# Patient Record
Sex: Female | Born: 1970 | Race: White | Hispanic: No | Marital: Single | Smoking: Light tobacco smoker
Health system: Southern US, Community
[De-identification: ages and names within clinical notes are randomized; demographics above are authoritative.]

## PROBLEM LIST (undated history)

## (undated) DIAGNOSIS — R519 Headache, unspecified: Secondary | ICD-10-CM

## (undated) DIAGNOSIS — Z8489 Family history of other specified conditions: Secondary | ICD-10-CM

## (undated) DIAGNOSIS — J449 Chronic obstructive pulmonary disease, unspecified: Secondary | ICD-10-CM

## (undated) DIAGNOSIS — E119 Type 2 diabetes mellitus without complications: Secondary | ICD-10-CM

## (undated) DIAGNOSIS — M549 Dorsalgia, unspecified: Secondary | ICD-10-CM

## (undated) DIAGNOSIS — R569 Unspecified convulsions: Secondary | ICD-10-CM

## (undated) DIAGNOSIS — R51 Headache: Secondary | ICD-10-CM

## (undated) DIAGNOSIS — C801 Malignant (primary) neoplasm, unspecified: Secondary | ICD-10-CM

## (undated) HISTORY — PX: ABDOMINAL HYSTERECTOMY: SHX81

## (undated) HISTORY — PX: APPENDECTOMY: SHX54

## (undated) HISTORY — DX: Chronic obstructive pulmonary disease, unspecified: J44.9

## (undated) HISTORY — PX: BACK SURGERY: SHX140

---

## 2003-06-29 ENCOUNTER — Emergency Department (HOSPITAL_COMMUNITY): Admission: EM | Admit: 2003-06-29 | Discharge: 2003-06-29 | Payer: Self-pay | Admitting: Emergency Medicine

## 2004-06-20 ENCOUNTER — Emergency Department: Payer: Self-pay | Admitting: Emergency Medicine

## 2004-12-26 ENCOUNTER — Ambulatory Visit: Payer: Self-pay | Admitting: Family Medicine

## 2005-02-05 ENCOUNTER — Ambulatory Visit: Payer: Self-pay | Admitting: Unknown Physician Specialty

## 2005-02-08 ENCOUNTER — Inpatient Hospital Stay: Payer: Self-pay

## 2006-03-05 ENCOUNTER — Ambulatory Visit: Payer: Self-pay | Admitting: Family Medicine

## 2006-03-07 ENCOUNTER — Ambulatory Visit: Payer: Self-pay | Admitting: Family Medicine

## 2007-07-17 ENCOUNTER — Emergency Department: Payer: Self-pay | Admitting: Emergency Medicine

## 2007-08-05 ENCOUNTER — Other Ambulatory Visit: Payer: Self-pay

## 2007-08-05 ENCOUNTER — Emergency Department: Payer: Self-pay | Admitting: Emergency Medicine

## 2009-06-23 ENCOUNTER — Emergency Department: Payer: Self-pay | Admitting: Unknown Physician Specialty

## 2009-11-01 ENCOUNTER — Emergency Department: Payer: Self-pay | Admitting: Emergency Medicine

## 2010-04-19 ENCOUNTER — Emergency Department: Payer: Self-pay | Admitting: Emergency Medicine

## 2010-05-18 ENCOUNTER — Emergency Department: Payer: Self-pay | Admitting: Emergency Medicine

## 2010-06-26 ENCOUNTER — Emergency Department: Payer: Self-pay | Admitting: Emergency Medicine

## 2010-10-08 ENCOUNTER — Ambulatory Visit: Payer: Self-pay | Admitting: Family Medicine

## 2010-10-16 ENCOUNTER — Ambulatory Visit: Payer: Self-pay | Admitting: Family Medicine

## 2010-10-29 ENCOUNTER — Ambulatory Visit: Payer: Self-pay | Admitting: Gastroenterology

## 2010-11-05 ENCOUNTER — Ambulatory Visit: Payer: Self-pay | Admitting: Gastroenterology

## 2011-02-22 ENCOUNTER — Ambulatory Visit: Payer: Self-pay | Admitting: Surgery

## 2011-03-04 ENCOUNTER — Ambulatory Visit: Payer: Self-pay | Admitting: Surgery

## 2012-02-07 ENCOUNTER — Emergency Department: Payer: Self-pay | Admitting: *Deleted

## 2012-02-07 LAB — URINALYSIS, COMPLETE
Bilirubin,UR: NEGATIVE
Glucose,UR: NEGATIVE mg/dL (ref 0–75)
Leukocyte Esterase: NEGATIVE
Ph: 7 (ref 4.5–8.0)
Squamous Epithelial: 1

## 2012-02-07 LAB — COMPREHENSIVE METABOLIC PANEL
Alkaline Phosphatase: 95 U/L (ref 50–136)
BUN: 8 mg/dL (ref 7–18)
Bilirubin,Total: 0.3 mg/dL (ref 0.2–1.0)
Chloride: 107 mmol/L (ref 98–107)
Creatinine: 0.81 mg/dL (ref 0.60–1.30)
EGFR (African American): >60
EGFR (Non-African Amer.): >60
Glucose: 97 mg/dL (ref 65–99)
Osmolality: 276 (ref 275–301)
Potassium: 3.7 mmol/L (ref 3.5–5.1)
SGOT(AST): 18 U/L (ref 15–37)
Sodium: 139 mmol/L (ref 136–145)

## 2012-02-07 LAB — CBC
HGB: 13.6 g/dL (ref 12.0–16.0)
MCH: 31.9 pg (ref 26.0–34.0)
MCHC: 33.5 g/dL (ref 32.0–36.0)
MCV: 95 fL (ref 80–100)
Platelet: 245 10*3/uL (ref 150–440)

## 2012-02-07 LAB — TROPONIN I: Troponin-I: <0.02 ng/mL

## 2012-02-07 LAB — PREGNANCY, URINE: Pregnancy Test, Urine: NEGATIVE m[IU]/mL

## 2012-02-07 LAB — LIPASE, BLOOD: Lipase: 229 U/L (ref 73–393)

## 2013-03-16 ENCOUNTER — Ambulatory Visit: Payer: Self-pay | Admitting: Family Medicine

## 2013-03-19 ENCOUNTER — Ambulatory Visit: Payer: Self-pay | Admitting: Obstetrics and Gynecology

## 2013-09-12 ENCOUNTER — Emergency Department: Payer: Self-pay | Admitting: Emergency Medicine

## 2013-12-12 ENCOUNTER — Emergency Department: Payer: Self-pay | Admitting: Emergency Medicine

## 2013-12-12 LAB — CBC
HCT: 41.5 % (ref 35.0–47.0)
HGB: 13.9 g/dL (ref 12.0–16.0)
MCH: 31.8 pg (ref 26.0–34.0)
MCHC: 33.5 g/dL (ref 32.0–36.0)
MCV: 95 fL (ref 80–100)
PLATELETS: 284 10*3/uL (ref 150–440)
RBC: 4.37 10*6/uL (ref 3.80–5.20)
RDW: 12.9 % (ref 11.5–14.5)
WBC: 14.5 10*3/uL — ABNORMAL HIGH (ref 3.6–11.0)

## 2013-12-12 LAB — COMPREHENSIVE METABOLIC PANEL
AST: 15 U/L (ref 15–37)
Albumin: 3.8 g/dL (ref 3.4–5.0)
Alkaline Phosphatase: 92 U/L
Anion Gap: 8 (ref 7–16)
BILIRUBIN TOTAL: 0.4 mg/dL (ref 0.2–1.0)
BUN: 10 mg/dL (ref 7–18)
CALCIUM: 9.3 mg/dL (ref 8.5–10.1)
CHLORIDE: 103 mmol/L (ref 98–107)
CREATININE: 0.85 mg/dL (ref 0.60–1.30)
Co2: 24 mmol/L (ref 21–32)
EGFR (Non-African Amer.): >60
Glucose: 100 mg/dL — ABNORMAL HIGH (ref 65–99)
OSMOLALITY: 269 (ref 275–301)
Potassium: 4 mmol/L (ref 3.5–5.1)
SGPT (ALT): 16 U/L (ref 12–78)
Sodium: 135 mmol/L — ABNORMAL LOW (ref 136–145)
Total Protein: 7.7 g/dL (ref 6.4–8.2)

## 2013-12-12 LAB — URINALYSIS, COMPLETE
Bacteria: NONE SEEN
Bilirubin,UR: NEGATIVE
GLUCOSE, UR: NEGATIVE mg/dL (ref 0–75)
Ketone: NEGATIVE
NITRITE: NEGATIVE
PROTEIN: NEGATIVE
Ph: 5 (ref 4.5–8.0)
Specific Gravity: 1.017 (ref 1.003–1.030)
Squamous Epithelial: 14
WBC UR: 3 /HPF (ref 0–5)

## 2014-01-25 ENCOUNTER — Emergency Department: Payer: Self-pay | Admitting: Emergency Medicine

## 2014-05-23 ENCOUNTER — Emergency Department: Payer: Self-pay | Admitting: Emergency Medicine

## 2014-06-24 HISTORY — PX: BACK SURGERY: SHX140

## 2014-07-18 ENCOUNTER — Emergency Department: Payer: Self-pay | Admitting: Emergency Medicine

## 2015-01-24 ENCOUNTER — Other Ambulatory Visit: Payer: Self-pay | Admitting: Orthopedic Surgery

## 2015-01-24 DIAGNOSIS — M545 Low back pain: Secondary | ICD-10-CM

## 2015-01-25 ENCOUNTER — Ambulatory Visit
Admission: RE | Admit: 2015-01-25 | Discharge: 2015-01-25 | Disposition: A | Discharge status: Home / Self Care | Payer: Worker's Compensation | Source: Ambulatory Visit | Attending: Orthopedic Surgery | Admitting: Orthopedic Surgery

## 2015-01-25 DIAGNOSIS — M545 Low back pain: Secondary | ICD-10-CM

## 2015-05-15 ENCOUNTER — Other Ambulatory Visit: Payer: Self-pay | Admitting: Orthopedic Surgery

## 2015-05-15 DIAGNOSIS — M5137 Other intervertebral disc degeneration, lumbosacral region: Secondary | ICD-10-CM

## 2015-05-29 ENCOUNTER — Other Ambulatory Visit: Payer: Self-pay | Admitting: Orthopedic Surgery

## 2015-05-29 ENCOUNTER — Ambulatory Visit
Admission: RE | Admit: 2015-05-29 | Discharge: 2015-05-29 | Disposition: A | Discharge status: Home / Self Care | Payer: Worker's Compensation | Source: Ambulatory Visit | Attending: Orthopedic Surgery | Admitting: Orthopedic Surgery

## 2015-05-29 DIAGNOSIS — M5137 Other intervertebral disc degeneration, lumbosacral region: Secondary | ICD-10-CM

## 2015-05-29 MED ORDER — CEFAZOLIN SODIUM-DEXTROSE 2-3 GM-% IV SOLR: 2.0000 g | Freq: Once | INTRAVENOUS | Status: DC

## 2015-05-29 MED ORDER — MIDAZOLAM HCL 2 MG/2ML IJ SOLN
1.0000 mg | INTRAMUSCULAR | Status: DC | PRN
  Administered 2015-05-29: 1 mg via INTRAVENOUS
  Administered 2015-05-29: 0.5 mg via INTRAVENOUS
  Administered 2015-05-29: 1 mg via INTRAVENOUS

## 2015-05-29 MED ORDER — IOHEXOL 180 MG/ML  SOLN
3.5000 mL | Freq: Once | INTRAMUSCULAR | Status: AC | PRN
  Administered 2015-05-29: 3.5 mL

## 2015-05-29 MED ORDER — MIDAZOLAM HCL 2 MG/2ML IJ SOLN: 1.0000 mg | INTRAMUSCULAR | Status: DC | PRN

## 2015-05-29 MED ORDER — KETOROLAC TROMETHAMINE 30 MG/ML IJ SOLN
30.0000 mg | Freq: Once | INTRAMUSCULAR | Status: AC
  Administered 2015-05-29: 30 mg via INTRAVENOUS

## 2015-05-29 MED ORDER — SODIUM CHLORIDE 0.9 % IV SOLN
Freq: Once | INTRAVENOUS | Status: AC
  Administered 2015-05-29: 08:00:00 via INTRAVENOUS

## 2015-05-29 MED ORDER — CEFAZOLIN SODIUM-DEXTROSE 2-3 GM-% IV SOLR
2.0000 g | Freq: Once | INTRAVENOUS | Status: AC
  Administered 2015-05-29: 2 g via INTRAVENOUS

## 2015-05-29 MED ORDER — FENTANYL CITRATE (PF) 100 MCG/2ML IJ SOLN: 25.0000 ug | INTRAMUSCULAR | Status: DC | PRN

## 2015-05-29 MED ORDER — KETOROLAC TROMETHAMINE 30 MG/ML IJ SOLN: 30.0000 mg | Freq: Once | INTRAMUSCULAR | Status: DC

## 2015-05-29 MED ORDER — SODIUM CHLORIDE 0.9 % IV SOLN
Freq: Once | INTRAVENOUS | Status: AC
  Administered 2015-05-29: 09:00:00 via INTRAVENOUS

## 2015-05-29 MED ORDER — FENTANYL CITRATE (PF) 100 MCG/2ML IJ SOLN
25.0000 ug | INTRAMUSCULAR | Status: DC | PRN
  Administered 2015-05-29 (×2): 50 ug via INTRAVENOUS

## 2015-05-29 NOTE — Discharge Instructions (Signed)
Discogram Post Procedure Discharge Instructions ° °1. May resume a regular diet and any medications that you routinely take (including pain medications). °2. No driving day of procedure. °3. Upon discharge go home and rest for at least 4 hours.  May use an ice pack as needed to injection sites on back.  Ice to back 30 minutes on and 30 minutes off, all day. °4. May remove bandades later, today. °5. It is not unusual to be sore for several days after this procedure. ° ° ° °Please contact our office at [phone] for the following symptoms: ° °· Fever greater than 100 degrees °· Increased swelling, pain, or redness at injection site. ° ° °Thank you for visiting [facility]. ° ° °

## 2015-05-29 NOTE — Progress Notes (Signed)
Sedation time for discogram was 45 minutes.

## 2015-08-09 ENCOUNTER — Other Ambulatory Visit: Payer: Self-pay | Admitting: Orthopedic Surgery

## 2015-08-17 ENCOUNTER — Encounter (HOSPITAL_COMMUNITY)
Admission: RE | Admit: 2015-08-17 | Discharge: 2015-08-17 | Disposition: A | Discharge status: Home / Self Care | Payer: Worker's Compensation | Source: Ambulatory Visit | Attending: Orthopedic Surgery | Admitting: Orthopedic Surgery

## 2015-08-17 ENCOUNTER — Encounter (HOSPITAL_COMMUNITY): Payer: Self-pay

## 2015-08-17 DIAGNOSIS — R9431 Abnormal electrocardiogram [ECG] [EKG]: Secondary | ICD-10-CM | POA: Diagnosis not present

## 2015-08-17 DIAGNOSIS — Z01818 Encounter for other preprocedural examination: Secondary | ICD-10-CM | POA: Diagnosis present

## 2015-08-17 DIAGNOSIS — I4510 Unspecified right bundle-branch block: Secondary | ICD-10-CM | POA: Diagnosis not present

## 2015-08-17 DIAGNOSIS — Z01812 Encounter for preprocedural laboratory examination: Secondary | ICD-10-CM | POA: Diagnosis not present

## 2015-08-17 DIAGNOSIS — M5136 Other intervertebral disc degeneration, lumbar region: Secondary | ICD-10-CM | POA: Insufficient documentation

## 2015-08-17 DIAGNOSIS — Z0183 Encounter for blood typing: Secondary | ICD-10-CM | POA: Diagnosis not present

## 2015-08-17 HISTORY — DX: Headache, unspecified: R51.9

## 2015-08-17 HISTORY — DX: Family history of other specified conditions: Z84.89

## 2015-08-17 HISTORY — DX: Headache: R51

## 2015-08-17 HISTORY — DX: Malignant (primary) neoplasm, unspecified: C80.1

## 2015-08-17 HISTORY — DX: Type 2 diabetes mellitus without complications: E11.9

## 2015-08-17 HISTORY — DX: Unspecified convulsions: R56.9

## 2015-08-17 LAB — COMPREHENSIVE METABOLIC PANEL
ALBUMIN: 3.7 g/dL (ref 3.5–5.0)
ALK PHOS: 73 U/L (ref 38–126)
ALT: 11 U/L — AB (ref 14–54)
AST: 15 U/L (ref 15–41)
Anion gap: 10 (ref 5–15)
BILIRUBIN TOTAL: 0.3 mg/dL (ref 0.3–1.2)
BUN: 5 mg/dL — AB (ref 6–20)
CALCIUM: 9.3 mg/dL (ref 8.9–10.3)
CO2: 22 mmol/L (ref 22–32)
CREATININE: 0.76 mg/dL (ref 0.44–1.00)
Chloride: 107 mmol/L (ref 101–111)
GFR calc Af Amer: >60 mL/min (ref >60)
GLUCOSE: 111 mg/dL — AB (ref 65–99)
Potassium: 4.1 mmol/L (ref 3.5–5.1)
SODIUM: 139 mmol/L (ref 135–145)
TOTAL PROTEIN: 6.7 g/dL (ref 6.5–8.1)

## 2015-08-17 LAB — CBC WITH DIFFERENTIAL/PLATELET
BASOS ABS: 0.1 10*3/uL (ref 0.0–0.1)
BASOS PCT: 1 %
EOS ABS: 0.2 10*3/uL (ref 0.0–0.7)
Eosinophils Relative: 2 %
HEMATOCRIT: 40.1 % (ref 36.0–46.0)
HEMOGLOBIN: 13.6 g/dL (ref 12.0–15.0)
Lymphocytes Relative: 37 %
Lymphs Abs: 4.1 10*3/uL — ABNORMAL HIGH (ref 0.7–4.0)
MCH: 32.4 pg (ref 26.0–34.0)
MCHC: 33.9 g/dL (ref 30.0–36.0)
MCV: 95.5 fL (ref 78.0–100.0)
MONO ABS: 0.5 10*3/uL (ref 0.1–1.0)
Monocytes Relative: 5 %
NEUTROS ABS: 6.1 10*3/uL (ref 1.7–7.7)
NEUTROS PCT: 55 %
Platelets: 268 10*3/uL (ref 150–400)
RBC: 4.2 MIL/uL (ref 3.87–5.11)
RDW: 12.9 % (ref 11.5–15.5)
WBC: 11 10*3/uL — ABNORMAL HIGH (ref 4.0–10.5)

## 2015-08-17 LAB — URINE MICROSCOPIC-ADD ON

## 2015-08-17 LAB — URINALYSIS, ROUTINE W REFLEX MICROSCOPIC
BILIRUBIN URINE: NEGATIVE
Glucose, UA: NEGATIVE mg/dL
Ketones, ur: NEGATIVE mg/dL
LEUKOCYTES UA: NEGATIVE
NITRITE: NEGATIVE
PH: 6 (ref 5.0–8.0)
Protein, ur: NEGATIVE mg/dL
SPECIFIC GRAVITY, URINE: 1.022 (ref 1.005–1.030)

## 2015-08-17 LAB — TYPE AND SCREEN
ABO/RH(D): A POS
ANTIBODY SCREEN: NEGATIVE

## 2015-08-17 LAB — SURGICAL PCR SCREEN
MRSA, PCR: NEGATIVE
STAPHYLOCOCCUS AUREUS: NEGATIVE

## 2015-08-17 LAB — APTT: APTT: 31 s (ref 24–37)

## 2015-08-17 LAB — PROTIME-INR
INR: 1.05 (ref 0.00–1.49)
PROTHROMBIN TIME: 13.9 s (ref 11.6–15.2)

## 2015-08-17 LAB — ABO/RH: ABO/RH(D): A POS

## 2015-08-17 LAB — GLUCOSE, CAPILLARY: GLUCOSE-CAPILLARY: 94 mg/dL (ref 65–99)

## 2015-08-17 NOTE — Pre-Procedure Instructions (Signed)
[name]  08/17/2015      [facility] [address] [place] - [address] [place])  [address] Phone: [phone] Fax: [phone]    Your procedure is scheduled on   Thursday  08/24/15  Report to [facility] Admitting at 530 A.M.  Call this number if you have problems the morning of surgery:  [phone]   Remember:  Do not eat food or drink liquids after midnight.  Take these medicines the morning of surgery with A SIP OF WATER   TYLENOL OR HYDROCODONE IF NEEDED   (STOP DICLOFENAC/ VOLTAREN GEL, NO ASPIRIN, IBUPROFEN/ ADVIL/ MOTRIN, GOODY POWDERS/ BC'S, VITAMINS, HERBAL MEDICINES)   Do not wear jewelry, make-up or nail polish.  Do not wear lotions, powders, or perfumes.  You may wear deodorant.  Do not shave 48 hours prior to surgery.  Men may shave face and neck.  Do not bring valuables to the hospital.  [facility] is not responsible for any belongings or valuables.  Contacts, dentures or bridgework may not be worn into surgery.  Leave your suitcase in the car.  After surgery it may be brought to your room.  For patients admitted to the hospital, discharge time will be determined by your treatment team.  Patients discharged the day of surgery will not be allowed to drive home.   Name and phone number of your driver:    Special instructions:  [facility] - Preparing for Surgery  Before surgery, you can play an important role.  Because skin is not sterile, your skin needs to be as free of germs as possible.  You can reduce the number of germs on you skin by washing with CHG (chlorahexidine gluconate) soap before surgery.  CHG is an antiseptic cleaner which kills germs and bonds with the skin to continue killing germs even after washing.  Please DO NOT use if you have an allergy to CHG or antibacterial soaps.  If your skin becomes reddened/irritated stop using the CHG and inform your nurse when you  arrive at Short Stay.  Do not shave (including legs and underarms) for at least 48 hours prior to the first CHG shower.  You may shave your face.  Please follow these instructions carefully:   1.  Shower with CHG Soap the night before surgery and the                                morning of Surgery.  2.  If you choose to wash your hair, wash your hair first as usual with your       normal shampoo.  3.  After you shampoo, rinse your hair and body thoroughly to remove the                      Shampoo.  4.  Use CHG as you would any other liquid soap.  You can apply chg directly       to the skin and wash gently with scrungie or a clean washcloth.  5.  Apply the CHG Soap to your body ONLY FROM THE NECK DOWN.        Do not use on open wounds or open sores.  Avoid contact with your eyes,       ears, mouth and genitals (private parts).  Wash genitals (private parts)  with your normal soap.  6.  Wash thoroughly, paying special attention to the area where your surgery        will be performed.  7.  Thoroughly rinse your body with warm water from the neck down.  8.  DO NOT shower/wash with your normal soap after using and rinsing off       the CHG Soap.  9.  Pat yourself dry with a clean towel.            10.  Wear clean pajamas.            11.  Place clean sheets on your bed the night of your first shower and do not        sleep with pets.  Day of Surgery  Do not apply any lotions/deoderants the morning of surgery.  Please wear clean clothes to the hospital/surgery center.    Please read over the following fact sheets that you were given. Pain Booklet, Coughing and Deep Breathing, Blood Transfusion Information, MRSA Information and Surgical Site Infection Prevention

## 2015-08-18 LAB — HEMOGLOBIN A1C
Hgb A1c MFr Bld: 6.1 % — ABNORMAL HIGH (ref 4.8–5.6)
MEAN PLASMA GLUCOSE: 128 mg/dL

## 2015-08-22 NOTE — H&P (Signed)
     PREOPERATIVE H&P  Chief Complaint: Low back pain  HPI: [name] is a 45 y.o. female who presents with ongoing pain in the low back x 10 months  MRI reveals DDD at L4/5, discogram revealed concordant pain at L4/5  Patient has failed multiple forms of conservative care and continues to have pain (see office notes for additional details regarding the patient's full course of treatment)  Past Medical History  Diagnosis Date  . Family history of adverse reaction to anesthesia     FATHER STOPS BREATHING WITH ANESTHESIA  . Seizures ([place])     1 SEIZURE  VERY STRESSED  . Diabetes mellitus without complication ([place])   . Headache     HX MIGRAINES  . Cancer ([place])     + CANCER CELLS TO UTERUS   Past Surgical History  Procedure Laterality Date  . Appendectomy    . Abdominal hysterectomy     Social History   Social History  . Marital Status: Single    Spouse Name: N/A  . Number of Children: N/A  . Years of Education: N/A   Social History Main Topics  . Smoking status: Current Every Day Smoker -- 0.50 packs/day for 28 years    Types: Cigarettes  . Smokeless tobacco: Never Used  . Alcohol Use: No  . Drug Use: No  . Sexual Activity: Not on file   Other Topics Concern  . Not on file   Social History Narrative   No family history on file. Allergies  Allergen Reactions  . Demerol [Meperidine] Rash  . Morphine And Related Rash   Prior to Admission medications   Medication Sig Start Date End Date Taking? Authorizing Provider  acetaminophen (TYLENOL) 325 MG tablet Take 650 mg by mouth every 4 (four) hours as needed for mild pain or headache.   Yes Historical Provider, MD  diclofenac sodium (VOLTAREN) 1 % GEL Apply 1 application topically 2 (two) times daily as needed.   Yes Historical Provider, MD  HYDROcodone-acetaminophen (NORCO/VICODIN) 5-325 MG tablet Take 1 tablet by mouth every 4 (four) hours as needed for moderate pain.   Yes Historical Provider, MD    meperidine (DEMEROL) 50 MG tablet Take 50 mg by mouth daily as needed for severe pain.   Yes Historical Provider, MD     All other systems have been reviewed and were otherwise negative with the exception of those mentioned in the HPI and as above.  Physical Exam: There were no vitals filed for this visit.  General: Alert, no acute distress Cardiovascular: No pedal edema Respiratory: No cyanosis, no use of accessory musculature Skin: No lesions in the area of chief complaint Neurologic: Sensation intact distally Psychiatric: Patient is competent for consent with normal mood and affect Lymphatic: No axillary or cervical lymphadenopathy  MUSCULOSKELETAL: + TTP low back  Assessment/Plan: Lumbar degenerative disc disease  Plan for Procedure(s): LUMBAR 4-5 TRANSFORAMINAL LUMBAR INTERBODY FUSION WITH INSTRUMENTATION AND ALLOGRAFT   Sinclair Ship, MD 08/22/2015 1:06 PM

## 2015-08-23 MED ORDER — POVIDONE-IODINE 7.5 % EX SOLN
Freq: Once | CUTANEOUS | Status: DC
  Filled 2015-08-23: qty 118

## 2015-08-23 MED ORDER — CEFAZOLIN SODIUM-DEXTROSE 2-3 GM-% IV SOLR
2.0000 g | INTRAVENOUS | Status: AC
  Administered 2015-08-24: 2 g via INTRAVENOUS
  Filled 2015-08-23: qty 50

## 2015-08-24 ENCOUNTER — Inpatient Hospital Stay (HOSPITAL_COMMUNITY): Payer: Worker's Compensation | Admitting: Certified Registered"

## 2015-08-24 ENCOUNTER — Inpatient Hospital Stay (HOSPITAL_COMMUNITY)
Admission: RE | Admit: 2015-08-24 | Discharge: 2015-08-25 | DRG: 460 | Disposition: A | Discharge status: Home / Self Care | Payer: Worker's Compensation | Source: Ambulatory Visit | Attending: Orthopedic Surgery | Admitting: Orthopedic Surgery

## 2015-08-24 ENCOUNTER — Encounter (HOSPITAL_COMMUNITY)
Admission: RE | Disposition: A | Discharge status: Home / Self Care | Payer: Self-pay | Source: Ambulatory Visit | Attending: Orthopedic Surgery

## 2015-08-24 ENCOUNTER — Inpatient Hospital Stay (HOSPITAL_COMMUNITY): Payer: Worker's Compensation

## 2015-08-24 ENCOUNTER — Encounter (HOSPITAL_COMMUNITY): Payer: Self-pay | Admitting: *Deleted

## 2015-08-24 DIAGNOSIS — F17210 Nicotine dependence, cigarettes, uncomplicated: Secondary | ICD-10-CM | POA: Diagnosis not present

## 2015-08-24 DIAGNOSIS — M5136 Other intervertebral disc degeneration, lumbar region: Secondary | ICD-10-CM | POA: Diagnosis not present

## 2015-08-24 DIAGNOSIS — E119 Type 2 diabetes mellitus without complications: Secondary | ICD-10-CM | POA: Diagnosis present

## 2015-08-24 DIAGNOSIS — Z79899 Other long term (current) drug therapy: Secondary | ICD-10-CM

## 2015-08-24 DIAGNOSIS — M545 Low back pain: Secondary | ICD-10-CM | POA: Diagnosis present

## 2015-08-24 LAB — GLUCOSE, CAPILLARY
GLUCOSE-CAPILLARY: 122 mg/dL — AB (ref 65–99)
Glucose-Capillary: 134 mg/dL — ABNORMAL HIGH (ref 65–99)
Glucose-Capillary: 129 mg/dL — ABNORMAL HIGH (ref 65–99)
Glucose-Capillary: 104 mg/dL — ABNORMAL HIGH (ref 65–99)

## 2015-08-24 SURGERY — POSTERIOR LUMBAR FUSION 1 LEVEL
Anesthesia: General | Laterality: Left

## 2015-08-24 MED ORDER — FENTANYL CITRATE (PF) 100 MCG/2ML IJ SOLN
INTRAMUSCULAR | Status: DC | PRN
  Administered 2015-08-24: 50 ug via INTRAVENOUS
  Administered 2015-08-24: 100 ug via INTRAVENOUS
  Administered 2015-08-24: 50 ug via INTRAVENOUS
  Administered 2015-08-24 (×2): 150 ug via INTRAVENOUS

## 2015-08-24 MED ORDER — ONDANSETRON HCL 4 MG/2ML IJ SOLN
4.0000 mg | Freq: Four times a day (QID) | INTRAMUSCULAR | Status: AC | PRN
  Administered 2015-08-24: 4 mg via INTRAVENOUS

## 2015-08-24 MED ORDER — ROCURONIUM BROMIDE 50 MG/5ML IV SOLN
INTRAVENOUS | Status: AC
  Filled 2015-08-24: qty 1

## 2015-08-24 MED ORDER — OXYCODONE-ACETAMINOPHEN 5-325 MG PO TABS
ORAL_TABLET | ORAL | Status: AC
  Filled 2015-08-24: qty 2

## 2015-08-24 MED ORDER — HYDROCODONE-ACETAMINOPHEN 5-325 MG PO TABS: 1.0000 | ORAL_TABLET | ORAL | Status: DC | PRN

## 2015-08-24 MED ORDER — PHENYLEPHRINE HCL 10 MG/ML IJ SOLN
10.0000 mg | INTRAVENOUS | Status: DC | PRN
  Administered 2015-08-24: 20 ug/min via INTRAVENOUS

## 2015-08-24 MED ORDER — THROMBIN 20000 UNITS EX SOLR
CUTANEOUS | Status: AC
  Filled 2015-08-24: qty 20000

## 2015-08-24 MED ORDER — BUPIVACAINE-EPINEPHRINE (PF) 0.25% -1:200000 IJ SOLN
INTRAMUSCULAR | Status: AC
  Filled 2015-08-24: qty 30

## 2015-08-24 MED ORDER — ROCURONIUM BROMIDE 100 MG/10ML IV SOLN
INTRAVENOUS | Status: DC | PRN
  Administered 2015-08-24: 30 mg via INTRAVENOUS

## 2015-08-24 MED ORDER — SUCCINYLCHOLINE CHLORIDE 20 MG/ML IJ SOLN
INTRAMUSCULAR | Status: DC | PRN
  Administered 2015-08-24: 100 mg via INTRAVENOUS

## 2015-08-24 MED ORDER — EPHEDRINE SULFATE 50 MG/ML IJ SOLN
INTRAMUSCULAR | Status: AC
  Filled 2015-08-24: qty 1

## 2015-08-24 MED ORDER — HYDROMORPHONE HCL 1 MG/ML IJ SOLN
0.2500 mg | INTRAMUSCULAR | Status: DC | PRN
  Administered 2015-08-24 (×2): 0.5 mg via INTRAVENOUS

## 2015-08-24 MED ORDER — 0.9 % SODIUM CHLORIDE (POUR BTL) OPTIME
TOPICAL | Status: DC | PRN
  Administered 2015-08-24: 1000 mL

## 2015-08-24 MED ORDER — HYDROMORPHONE HCL 1 MG/ML IJ SOLN
INTRAMUSCULAR | Status: AC
  Administered 2015-08-24: 0.5 mg via INTRAVENOUS
  Filled 2015-08-24: qty 1

## 2015-08-24 MED ORDER — LACTATED RINGERS IV SOLN
INTRAVENOUS | Status: DC | PRN
  Administered 2015-08-24 (×2): via INTRAVENOUS

## 2015-08-24 MED ORDER — MORPHINE SULFATE (PF) 2 MG/ML IV SOLN
1.0000 mg | INTRAVENOUS | Status: DC | PRN
  Filled 2015-08-24: qty 1

## 2015-08-24 MED ORDER — ONDANSETRON HCL 4 MG/2ML IJ SOLN
INTRAMUSCULAR | Status: AC
  Filled 2015-08-24: qty 2

## 2015-08-24 MED ORDER — PHENOL 1.4 % MT LIQD: 1.0000 | OROMUCOSAL | Status: DC | PRN

## 2015-08-24 MED ORDER — OXYCODONE HCL 5 MG PO TABS: 5.0000 mg | ORAL_TABLET | Freq: Once | ORAL | Status: DC | PRN

## 2015-08-24 MED ORDER — SODIUM CHLORIDE 0.9% FLUSH: 3.0000 mL | INTRAVENOUS | Status: DC | PRN

## 2015-08-24 MED ORDER — CEFAZOLIN SODIUM 1-5 GM-% IV SOLN
1.0000 g | Freq: Three times a day (TID) | INTRAVENOUS | Status: AC
  Administered 2015-08-24 (×2): 1 g via INTRAVENOUS
  Filled 2015-08-24 (×2): qty 50

## 2015-08-24 MED ORDER — SODIUM CHLORIDE 0.9 % IJ SOLN
INTRAMUSCULAR | Status: AC
  Filled 2015-08-24: qty 10

## 2015-08-24 MED ORDER — FENTANYL CITRATE (PF) 250 MCG/5ML IJ SOLN
INTRAMUSCULAR | Status: AC
  Filled 2015-08-24: qty 5

## 2015-08-24 MED ORDER — SENNOSIDES-DOCUSATE SODIUM 8.6-50 MG PO TABS: 1.0000 | ORAL_TABLET | Freq: Every evening | ORAL | Status: DC | PRN

## 2015-08-24 MED ORDER — SODIUM CHLORIDE 0.9% FLUSH
3.0000 mL | Freq: Two times a day (BID) | INTRAVENOUS | Status: DC
  Administered 2015-08-24 (×2): 3 mL via INTRAVENOUS

## 2015-08-24 MED ORDER — BISACODYL 5 MG PO TBEC: 5.0000 mg | DELAYED_RELEASE_TABLET | Freq: Every day | ORAL | Status: DC | PRN

## 2015-08-24 MED ORDER — PROPOFOL 10 MG/ML IV BOLUS
INTRAVENOUS | Status: AC
  Filled 2015-08-24: qty 20

## 2015-08-24 MED ORDER — PROPOFOL 10 MG/ML IV BOLUS
INTRAVENOUS | Status: DC | PRN
  Administered 2015-08-24: 150 mg via INTRAVENOUS

## 2015-08-24 MED ORDER — DOCUSATE SODIUM 100 MG PO CAPS
100.0000 mg | ORAL_CAPSULE | Freq: Two times a day (BID) | ORAL | Status: DC
  Administered 2015-08-24: 100 mg via ORAL

## 2015-08-24 MED ORDER — MENTHOL 3 MG MT LOZG: 1.0000 | LOZENGE | OROMUCOSAL | Status: DC | PRN

## 2015-08-24 MED ORDER — BUPIVACAINE-EPINEPHRINE 0.25% -1:200000 IJ SOLN
INTRAMUSCULAR | Status: DC | PRN
  Administered 2015-08-24: 30 mL

## 2015-08-24 MED ORDER — MINERAL OIL LIGHT 100 % EX OIL
TOPICAL_OIL | CUTANEOUS | Status: AC
  Filled 2015-08-24: qty 25

## 2015-08-24 MED ORDER — MINERAL OIL LIGHT 100 % EX OIL
TOPICAL_OIL | CUTANEOUS | Status: DC | PRN
  Administered 2015-08-24: 1 via TOPICAL

## 2015-08-24 MED ORDER — ONDANSETRON HCL 4 MG/2ML IJ SOLN
INTRAMUSCULAR | Status: DC | PRN
  Administered 2015-08-24: 4 mg via INTRAVENOUS

## 2015-08-24 MED ORDER — FLEET ENEMA 7-19 GM/118ML RE ENEM: 1.0000 | ENEMA | Freq: Once | RECTAL | Status: DC | PRN

## 2015-08-24 MED ORDER — MIDAZOLAM HCL 2 MG/2ML IJ SOLN
INTRAMUSCULAR | Status: AC
  Filled 2015-08-24: qty 2

## 2015-08-24 MED ORDER — LIDOCAINE HCL (CARDIAC) 20 MG/ML IV SOLN
INTRAVENOUS | Status: AC
  Filled 2015-08-24: qty 5

## 2015-08-24 MED ORDER — ALUM & MAG HYDROXIDE-SIMETH 200-200-20 MG/5ML PO SUSP: 30.0000 mL | Freq: Four times a day (QID) | ORAL | Status: DC | PRN

## 2015-08-24 MED ORDER — SODIUM CHLORIDE 0.9 % IV SOLN: INTRAVENOUS | Status: DC

## 2015-08-24 MED ORDER — SODIUM CHLORIDE 0.9 % IV SOLN
75.2000 mg | INTRAVENOUS | Status: DC | PRN
  Administered 2015-08-24: 75.2 mg via INTRAVENOUS

## 2015-08-24 MED ORDER — OXYCODONE-ACETAMINOPHEN 5-325 MG PO TABS
1.0000 | ORAL_TABLET | ORAL | Status: DC | PRN
  Administered 2015-08-24 – 2015-08-25 (×4): 2 via ORAL
  Filled 2015-08-24 (×3): qty 2

## 2015-08-24 MED ORDER — PROPOFOL 500 MG/50ML IV EMUL
INTRAVENOUS | Status: DC | PRN
  Administered 2015-08-24: 100 ug/kg/min via INTRAVENOUS
  Administered 2015-08-24: 10:00:00 via INTRAVENOUS

## 2015-08-24 MED ORDER — ONDANSETRON HCL 4 MG/2ML IJ SOLN: 4.0000 mg | INTRAMUSCULAR | Status: DC | PRN

## 2015-08-24 MED ORDER — DIAZEPAM 5 MG PO TABS
5.0000 mg | ORAL_TABLET | Freq: Four times a day (QID) | ORAL | Status: DC | PRN
Start: 2015-08-24 — End: 2015-08-25
  Administered 2015-08-24 – 2015-08-25 (×2): 5 mg via ORAL
  Filled 2015-08-24 (×2): qty 1

## 2015-08-24 MED ORDER — HEMOSTATIC AGENTS (NO CHARGE) OPTIME
TOPICAL | Status: DC | PRN
  Administered 2015-08-24: 1 via TOPICAL

## 2015-08-24 MED ORDER — OXYCODONE HCL 5 MG/5ML PO SOLN: 5.0000 mg | Freq: Once | ORAL | Status: DC | PRN

## 2015-08-24 MED ORDER — ACETAMINOPHEN 325 MG PO TABS: 650.0000 mg | ORAL_TABLET | ORAL | Status: DC | PRN

## 2015-08-24 MED ORDER — LIDOCAINE HCL (CARDIAC) 20 MG/ML IV SOLN
INTRAVENOUS | Status: DC | PRN
  Administered 2015-08-24: 60 mg via INTRAVENOUS

## 2015-08-24 MED ORDER — MIDAZOLAM HCL 5 MG/5ML IJ SOLN
INTRAMUSCULAR | Status: DC | PRN
  Administered 2015-08-24: 2 mg via INTRAVENOUS

## 2015-08-24 MED ORDER — SUCCINYLCHOLINE CHLORIDE 20 MG/ML IJ SOLN
INTRAMUSCULAR | Status: AC
  Filled 2015-08-24: qty 1

## 2015-08-24 MED ORDER — ACETAMINOPHEN 650 MG RE SUPP: 650.0000 mg | RECTAL | Status: DC | PRN

## 2015-08-24 MED ORDER — THROMBIN 20000 UNITS EX KIT
PACK | CUTANEOUS | Status: DC | PRN
  Administered 2015-08-24: 20000 [IU] via TOPICAL

## 2015-08-24 SURGICAL SUPPLY — 81 items
APL SKNCLS STERI-STRIP NONHPOA (GAUZE/BANDAGES/DRESSINGS) ×1
BENZOIN TINCTURE PRP APPL 2/3 (GAUZE/BANDAGES/DRESSINGS) ×2 IMPLANT
BLADE SURG ROTATE 9660 (MISCELLANEOUS) IMPLANT
BUR PRESCISION 1.7 ELITE (BURR) IMPLANT
BUR ROUND PRECISION 4.0 (BURR) IMPLANT
BUR SABER RD CUTTING 3.0 (BURR) IMPLANT
CAGE CONCORDE BULLET 11X12X27 (Cage) ×2 IMPLANT
CAGE SPNL PRLL BLT NOSE 27X11 (Cage) IMPLANT
CARTRIDGE OIL MAESTRO DRILL (MISCELLANEOUS) ×1 IMPLANT
CLSR STERI-STRIP ANTIMIC 1/2X4 (GAUZE/BANDAGES/DRESSINGS) ×1 IMPLANT
CONT SPEC STER OR (MISCELLANEOUS) ×2 IMPLANT
COVER MAYO STAND STRL (DRAPES) ×4 IMPLANT
COVER SURGICAL LIGHT HANDLE (MISCELLANEOUS) ×2 IMPLANT
DIFFUSER DRILL AIR PNEUMATIC (MISCELLANEOUS) ×2 IMPLANT
DRAIN CHANNEL 15F RND FF W/TCR (WOUND CARE) IMPLANT
DRAPE C-ARM 42X72 X-RAY (DRAPES) ×2 IMPLANT
DRAPE C-ARMOR (DRAPES) IMPLANT
DRAPE POUCH INSTRU U-SHP 10X18 (DRAPES) ×2 IMPLANT
DRAPE SURG 17X23 STRL (DRAPES) ×8 IMPLANT
DURAPREP 26ML APPLICATOR (WOUND CARE) ×2 IMPLANT
ELECT BLADE 4.0 EZ CLEAN MEGAD (MISCELLANEOUS) ×2
ELECT CAUTERY BLADE 6.4 (BLADE) ×2 IMPLANT
ELECT REM PT RETURN 9FT ADLT (ELECTROSURGICAL) ×2
ELECTRODE BLDE 4.0 EZ CLN MEGD (MISCELLANEOUS) ×1 IMPLANT
ELECTRODE REM PT RTRN 9FT ADLT (ELECTROSURGICAL) ×1 IMPLANT
EVACUATOR SILICONE 100CC (DRAIN) IMPLANT
GAUZE SPONGE 4X4 12PLY STRL (GAUZE/BANDAGES/DRESSINGS) ×2 IMPLANT
GAUZE SPONGE 4X4 16PLY XRAY LF (GAUZE/BANDAGES/DRESSINGS) ×8 IMPLANT
GLOVE BIO SURGEON STRL SZ7 (GLOVE) ×2 IMPLANT
GLOVE BIO SURGEON STRL SZ8 (GLOVE) ×2 IMPLANT
GLOVE BIOGEL PI IND STRL 7.0 (GLOVE) ×1 IMPLANT
GLOVE BIOGEL PI IND STRL 8 (GLOVE) ×1 IMPLANT
GLOVE BIOGEL PI INDICATOR 7.0 (GLOVE) ×1
GLOVE BIOGEL PI INDICATOR 8 (GLOVE) ×1
GOWN STRL REUS W/ TWL LRG LVL3 (GOWN DISPOSABLE) ×2 IMPLANT
GOWN STRL REUS W/ TWL XL LVL3 (GOWN DISPOSABLE) ×1 IMPLANT
GOWN STRL REUS W/TWL LRG LVL3 (GOWN DISPOSABLE) ×4
GOWN STRL REUS W/TWL XL LVL3 (GOWN DISPOSABLE) ×2
IV CATH 14GX2 1/4 (CATHETERS) ×2 IMPLANT
KIT BASIN OR (CUSTOM PROCEDURE TRAY) ×2 IMPLANT
KIT POSITION SURG JACKSON T1 (MISCELLANEOUS) ×2 IMPLANT
KIT ROOM TURNOVER OR (KITS) ×2 IMPLANT
MARKER SKIN DUAL TIP RULER LAB (MISCELLANEOUS) ×2 IMPLANT
MIX DBX 10CC 35% BONE (Bone Implant) ×1 IMPLANT
NDL HYPO 25GX1X1/2 BEV (NEEDLE) ×1 IMPLANT
NDL SAFETY ECLIPSE 18X1.5 (NEEDLE) ×1 IMPLANT
NDL SPNL 18GX3.5 QUINCKE PK (NEEDLE) ×2 IMPLANT
NEEDLE 22X1 1/2 (OR ONLY) (NEEDLE) ×2 IMPLANT
NEEDLE HYPO 18GX1.5 SHARP (NEEDLE) ×2
NEEDLE HYPO 25GX1X1/2 BEV (NEEDLE) ×2 IMPLANT
NEEDLE SPNL 18GX3.5 QUINCKE PK (NEEDLE) ×4 IMPLANT
NS IRRIG 1000ML POUR BTL (IV SOLUTION) ×2 IMPLANT
OIL CARTRIDGE MAESTRO DRILL (MISCELLANEOUS) ×2
PACK LAMINECTOMY ORTHO (CUSTOM PROCEDURE TRAY) ×2 IMPLANT
PACK UNIVERSAL I (CUSTOM PROCEDURE TRAY) ×2 IMPLANT
PAD ARMBOARD 7.5X6 YLW CONV (MISCELLANEOUS) ×4 IMPLANT
PATTIES SURGICAL .5 X1 (DISPOSABLE) ×2 IMPLANT
PATTIES SURGICAL .5X1.5 (GAUZE/BANDAGES/DRESSINGS) ×2 IMPLANT
ROD PRE BENT EXP 40MM (Rod) ×2 IMPLANT
SCREW SET SINGLE INNER (Screw) ×4 IMPLANT
SCREW VIPER CORT FIX 6X35 (Screw) ×4 IMPLANT
SPONGE INTESTINAL PEANUT (DISPOSABLE) ×3 IMPLANT
SPONGE SURGIFOAM ABS GEL 100 (HEMOSTASIS) ×2 IMPLANT
STRIP CLOSURE SKIN 1/2X4 (GAUZE/BANDAGES/DRESSINGS) ×4 IMPLANT
SURGIFLO W/THROMBIN 8M KIT (HEMOSTASIS) IMPLANT
SUT MNCRL AB 4-0 PS2 18 (SUTURE) ×4 IMPLANT
SUT VIC AB 0 CT1 18XCR BRD 8 (SUTURE) ×1 IMPLANT
SUT VIC AB 0 CT1 8-18 (SUTURE) ×2
SUT VIC AB 1 CT1 18XCR BRD 8 (SUTURE) ×2 IMPLANT
SUT VIC AB 1 CT1 8-18 (SUTURE) ×4
SUT VIC AB 2-0 CT2 18 VCP726D (SUTURE) ×2 IMPLANT
SYR 20CC LL (SYRINGE) ×2 IMPLANT
SYR BULB IRRIGATION 50ML (SYRINGE) ×2 IMPLANT
SYR CONTROL 10ML LL (SYRINGE) ×4 IMPLANT
SYR TB 1ML LUER SLIP (SYRINGE) ×2 IMPLANT
TAPE CLOTH SURG 4X10 WHT LF (GAUZE/BANDAGES/DRESSINGS) ×1 IMPLANT
TOWEL OR 17X24 6PK STRL BLUE (TOWEL DISPOSABLE) ×2 IMPLANT
TOWEL OR 17X26 10 PK STRL BLUE (TOWEL DISPOSABLE) ×2 IMPLANT
TRAY FOLEY CATH 16FRSI W/METER (SET/KITS/TRAYS/PACK) ×2 IMPLANT
WATER STERILE IRR 1000ML POUR (IV SOLUTION) ×2 IMPLANT
YANKAUER SUCT BULB TIP NO VENT (SUCTIONS) ×2 IMPLANT

## 2015-08-24 NOTE — Op Note (Signed)
[name], [name] NO.:  [id number]  MEDICAL RECORD NO.:  [id number]  LOCATION:  [id number]                        FACILITY:  [facility]  PHYSICIAN:  Phylliss Bob, MD      DATE OF BIRTH:  Dec 07, 1970  DATE OF PROCEDURE:  08/24/2015                              OPERATIVE REPORT   PREOPERATIVE DIAGNOSES: 1. L4-5 degenerative disc disease. 2. Ongoing and severe axial low back pain, with a discogram notable     for concordant pain associated with the L4-5 level. 3. L4-5 annular tear.  POSTOPERATIVE DIAGNOSES: 1. L4-5 degenerative disc disease. 2. Ongoing and severe axial low back pain, with a discogram notable     for concordant pain associated with the L4-5 level. 3. L4-5 annular tear.  PROCEDURE: 1. Left-sided L4-5 transforaminal lumbar interbody fusion. 2. Right-sided L4-5 posterolateral fusion. 3. Placement of posterior instrumentation L4, L5. 4. Use of local autograft. 5. Use of morselized allograft. 6. Intraoperative use of fluoroscopy.  SURGEON:  Phylliss Bob, M.D.  ASSISTANT:  Pricilla Holm, PA-C.  ANESTHESIA:  General endotracheal anesthesia.  COMPLICATIONS:  None.  DISPOSITION:  Stable.  ESTIMATED BLOOD LOSS:  Minimal.  INDICATIONS FOR SURGERY:  Briefly, Ms. 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  She went on to have ongoing pain in the low back.  An MRI was notable for an annular tear at the L4-5 level.  A discogram did reveal concordant pain at L4-5 as well.  Given the patient's ongoing pain and dysfunction, despite appropriate conservative treatment measures, we did discuss proceeding with the procedure noted above.  The patient was fully aware of the risks and limitations of the procedure and did elect to proceed.  OPERATIVE DETAILS:  On August 24, 2015, the patient was brought to surgery and general endotracheal anesthesia was administered.  The patient  was placed prone on a well-padded flat Jackson bed with a Wilson frame. Antibiotics were given and then back was prepped and draped.  A time-out was performed.  A midline incision was made overlying the L4-5 intervertebral space.  The facet joints were identified bilaterally and decorticated.  I then used anatomic landmarks in addition to AP and lateral fluoroscopy to cannulate the L4 and L5 pedicles bilaterally.  I did tap up to a 6 mm tap.  I did use a medial to lateral cortical trajectory technique.  On the right side, the L4-5 facet joint was decorticated.  A 6 x 35 mm screws were placed on the right at L4 and at L5.  A 40 mm rod was placed and distraction was applied across the rod. Caps were placed and provisionally tightened.  On the left side, bone wax was placed in the cannulated pedicle holes.  I then proceeded with a full facetectomy on the left side.  With an assistant holding medial retraction of the traversing left L5 nerve, I did perform a thorough and complete L4-5 intervertebral discectomy.  I was very pleased with the discectomy that I was able to accomplish.  The endplates were prepared and the interbody space was packed with autograft and allograft.  A 12 mm  interbody spacer was also packed with allograft and autograft and tamped into position.  I was very pleased with the press-fit of the implant.  Distraction was then discontinued on the contralateral side, and the Wilson frame was flattened.  I then placed 6 x 35 mm screws on the left at L4 and at L5.  A 40 mm rod was placed and caps were placed. L4 caps were then tightened and a final locking procedure was performed. I was very pleased with the final AP and lateral fluoroscopic images. The epidural space was then explored.  There was no undue bleeding encountered.  There was no extravasation of cerebrospinal fluid.  She was very pleased with the final construct.  Of note, the "wound" was copiously irrigated with  approximately 2 L of normal saline throughout the case.  The wound was then closed in layers using #1 Vicryl followed by 2-0 Vicryl, followed by 3-0 Monocryl.  Benzoin and Steri-Strips were applied followed by sterile dressing.  All instrument counts were correct at the termination of the procedure.  Of note, Pricilla Holm was my assistant throughout the surgery from start to finish, and did aid in retraction, suctioning, and closure. Also of note, I did use neurologic monitoring throughout the surgery, and there was no abnormal EMG activity from start to finish.     Phylliss Bob, MD     MD/MEDQ  D:  08/24/2015  T:  08/24/2015  Job:  [id number]

## 2015-08-24 NOTE — Progress Notes (Signed)
Orthopedic Tech Progress Note Patient Details:  [name] [birth year] [name] Called in TLSO order to [employer]. Patient ID: [name], female   DOB: [birth year], 45 y.o.   MRN: [name]   Darrol Poke 08/24/2015, 1:43 PM

## 2015-08-24 NOTE — Evaluation (Signed)
Physical Therapy Evaluation Patient Details Name: [name] MRN: [id number] DOB: Dec 16, 1970 Today's Date: 08/24/2015   History of Present Illness  Patient is a 45 yo female s/p LUMBAR 4-5 TRANSFORAMINAL LUMBAR INTERBODY FUSION WITH INSTRUMENTATION AND ALLOGRAFT  Clinical Impression  Patient demonstrates deficits in functional mobility as indicated below. Will need continued skilled PT to address deficits and maximize function. Will see as indicated and progress as tolerated. Will need BSC for home use.    Follow Up Recommendations Supervision for mobility/OOB    Equipment Recommendations  3in1 (PT)    Recommendations for Other Services       Precautions / Restrictions Precautions Precautions: Back Precaution Booklet Issued: Yes (comment) Precaution Comments: verbally reviewed with patient Required Braces or Orthoses: Spinal Brace Spinal Brace: Thoracolumbosacral orthotic      Mobility  Bed Mobility Overal bed mobility: Needs Assistance Bed Mobility: Rolling;Sidelying to Sit;Sit to Sidelying Rolling: Min guard Sidelying to sit: Min assist     Sit to sidelying: Min assist General bed mobility comments: VCs for sequencing and technique, assist for trunk elevation and control.  Transfers Overall transfer level: Needs assistance Equipment used: 1 person hand held assist Transfers: Sit to/from Stand Sit to Stand: Min assist         General transfer comment: Vcs for hand placement and positioning, increased time to elevate to standing, HHA provided in standing with min assist for stability during transition to upright  Ambulation/Gait Ambulation/Gait assistance: Min assist Ambulation Distance (Feet): 110 Feet Assistive device: 1 person hand held assist Gait Pattern/deviations: Step-through pattern;Decreased stride length;Drifts right/left     General Gait Details: patient initially very slow with gait speed, cued to increase cadence to normal speed, patient did  well with change and improvements noted in stability  Stairs            Wheelchair Mobility    Modified Rankin (Stroke Patients Only)       Balance Overall balance assessment: No apparent balance deficits (not formally assessed)                                           Pertinent Vitals/Pain Pain Assessment: 0-10 Pain Score: 7  Pain Location: back Pain Descriptors / Indicators: Aching;Grimacing;Guarding;Operative site guarding Pain Intervention(s): Limited activity within patient's tolerance;Monitored during session;Repositioned    Home Living Family/patient expects to be discharged to:: Private residence Living Arrangements: Spouse/significant other Available Help at Discharge: Family Type of Home: House Home Access: Stairs to enter Entrance Stairs-Rails: None Technical brewer of Steps: 4 Home Layout: One level Home Equipment: None      Prior Function Level of Independence: Independent               Hand Dominance   Dominant Hand: Right    Extremity/Trunk Assessment   Upper Extremity Assessment: Overall WFL for tasks assessed           Lower Extremity Assessment: Overall WFL for tasks assessed         Communication   Communication: No difficulties  Cognition Arousal/Alertness: Awake/alert Behavior During Therapy: WFL for tasks assessed/performed Overall Cognitive Status: Within Functional Limits for tasks assessed                      General Comments      Exercises        Assessment/Plan    PT  Assessment Patient needs continued PT services  PT Diagnosis Difficulty walking;Abnormality of gait;Acute pain   PT Problem List Decreased strength;Decreased activity tolerance;Decreased balance;Decreased mobility;Pain  PT Treatment Interventions DME instruction;Gait training;Stair training;Functional mobility training;Therapeutic activities;Therapeutic exercise;Balance training;Patient/family education    PT Goals (Current goals can be found in the Care Plan section) Acute Rehab PT Goals Patient Stated Goal: to go home PT Goal Formulation: With patient Time For Goal Achievement: 09/07/15 Potential to Achieve Goals: Good    Frequency Min 5X/week   Barriers to discharge        Co-evaluation               End of Session Equipment Utilized During Treatment: Gait belt;Back brace Activity Tolerance: Patient tolerated treatment well;Patient limited by fatigue Patient left: in bed;with call bell/phone within reach;with family/visitor present Nurse Communication: Mobility status         Time: [id number] PT Time Calculation (min) (ACUTE ONLY): 19 min   Charges:   PT Evaluation $PT Eval Moderate Complexity: 1 Procedure     PT G CodesDuncan Dull 23-Sep-2015, 5:50 PM Alben Deeds, [place] DPT  [phone]

## 2015-08-24 NOTE — Anesthesia Preprocedure Evaluation (Signed)
Anesthesia Evaluation  Patient identified by MRN, date of birth, ID band Patient awake    Reviewed: Allergy & Precautions, NPO status , Patient's Chart, lab work & pertinent test results  Airway Mallampati: II   Neck ROM: full    Dental   Pulmonary Current Smoker,    breath sounds clear to auscultation       Cardiovascular negative cardio ROS   Rhythm:regular Rate:Normal     Neuro/Psych  Headaches, Seizures -,     GI/Hepatic   Endo/Other  diabetes, Type 2  Renal/GU      Musculoskeletal   Abdominal   Peds  Hematology   Anesthesia Other Findings   Reproductive/Obstetrics                             Anesthesia Physical Anesthesia Plan  ASA: II  Anesthesia Plan: General   Post-op Pain Management:    Induction: Intravenous  Airway Management Planned: Oral ETT  Additional Equipment:   Intra-op Plan:   Post-operative Plan: Extubation in OR  Informed Consent: I have reviewed the patients History and Physical, chart, labs and discussed the procedure including the risks, benefits and alternatives for the proposed anesthesia with the patient or authorized representative who has indicated his/her understanding and acceptance.     Plan Discussed with: CRNA, Anesthesiologist and Surgeon  Anesthesia Plan Comments:         Anesthesia Quick Evaluation

## 2015-08-24 NOTE — Anesthesia Procedure Notes (Signed)
Procedure Name: Intubation Date/Time: 08/24/2015 7:36 AM Performed by: Lance Coon Pre-anesthesia Checklist: Patient identified, Emergency Drugs available, Suction available, Patient being monitored and Timeout performed Patient Re-evaluated:Patient Re-evaluated prior to inductionOxygen Delivery Method: Circle system utilized Preoxygenation: Pre-oxygenation with 100% oxygen Intubation Type: IV induction Laryngoscope Size: Miller and 2 Grade View: Grade I Tube type: Oral Tube size: 7.0 mm Number of attempts: 1 Airway Equipment and Method: Stylet Placement Confirmation: ETT inserted through vocal cords under direct vision,  positive ETCO2 and breath sounds checked- equal and bilateral Secured at: 21 cm Tube secured with: Tape Dental Injury: Teeth and Oropharynx as per pre-operative assessment

## 2015-08-24 NOTE — Transfer of Care (Signed)
Immediate Anesthesia Transfer of Care Note  Patient: [name]  Procedure(s) Performed: Procedure(s) with comments: Left SIDED LUMBAR 4-5 TRANSFORAMINAL LUMBAR INTERBODY FUSION WITH INSTRUMENTATION AND ALLOGRAFT (Left) - Left sided lumbar 4-5 Transforaminal lumbar interbody fusion with instrumentation and allograft  Patient Location: PACU  Anesthesia Type:General  Level of Consciousness: awake, alert  and patient cooperative  Airway & Oxygen Therapy: Patient Spontanous Breathing and Patient connected to nasal cannula oxygen  Post-op Assessment: Report given to RN, Post -op Vital signs reviewed and stable and Patient moving all extremities X 4  Post vital signs: Reviewed and stable  Last Vitals:  Filed Vitals:   08/24/15 0649  BP: 128/92  Pulse: 90  Temp: 36.4 C  Resp: 18    Complications: No apparent anesthesia complications

## 2015-08-25 LAB — GLUCOSE, CAPILLARY: GLUCOSE-CAPILLARY: 153 mg/dL — AB (ref 65–99)

## 2015-08-25 MED FILL — Thrombin For Soln 20000 Unit: CUTANEOUS | Qty: 1 | Status: AC

## 2015-08-25 NOTE — Care Management Note (Signed)
Case Management Note  Patient Details  Name: [name] MRN: [id number] Date of Birth: 05/18/1971  Subjective/Objective: 38 yr.old female s/p  LUMBAR 4-5 TRANSFORAMINAL LUMBAR INTERBODY FUSION WITH INSTRUMENTATION AND ALLOGRAFT.    Action/Plan: Case manager spoke with patient and [name] concerning DME needs. Patient is under worker's comp. CM spoke with Janace Hoard, RN CM for worker's comp. [phone],Faxed orders to her at : [phone]. CM requested that rolling walker and 3in1 be delivered to patient's room.  Expected Discharge Date:   08/25/15               Expected Discharge Plan:   home/selfcare  In-House Referral:  NA  Discharge planning Services  CM Consult  Post Acute Care Choice:  Durable Medical Equipment Choice offered to:     DME Arranged:  3-N-1, Walker rolling DME Agency:   (patient is under worker's comp, DME to be arranged )  HH Arranged:  NA HH Agency:  NA  Status of Service:  In process, will continue to follow  Medicare Important Message Given:    Date Medicare IM Given:    Medicare IM give by:    Date Additional Medicare IM Given:    Additional Medicare Important Message give by:     If discussed at [place] of Stay Meetings, dates discussed:    Additional Comments:  Ninfa Meeker, RN 08/25/2015, 11:50 AM

## 2015-08-25 NOTE — Anesthesia Postprocedure Evaluation (Signed)
Anesthesia Post Note  Patient: [name]  Procedure(s) Performed: Procedure(s) (LRB): Left SIDED LUMBAR 4-5 TRANSFORAMINAL LUMBAR INTERBODY FUSION WITH INSTRUMENTATION AND ALLOGRAFT (Left)  Patient location during evaluation: PACU Anesthesia Type: General Level of consciousness: awake and alert and patient cooperative Pain management: pain level controlled Vital Signs Assessment: post-procedure vital signs reviewed and stable Respiratory status: spontaneous breathing and respiratory function stable Cardiovascular status: stable Anesthetic complications: no    Last Vitals:  Filed Vitals:   08/25/15 0400 08/25/15 0747  BP: 104/58 105/53  Pulse: 80 105  Temp: 37.6 C 37.3 C  Resp: 20 18    Last Pain:  Filed Vitals:   08/25/15 0747  PainSc: 7                  Nas Wafer S

## 2015-08-25 NOTE — Progress Notes (Signed)
Occupational Therapy Evaluation/Discharge Patient Details Name: [name] MRN: [name] DOB: May 22, 1971 Today's Date: 08/25/2015    History of Present Illness Patient is a 45 yo female s/p LUMBAR 4-5 TRANSFORAMINAL LUMBAR INTERBODY FUSION WITH INSTRUMENTATION AND ALLOGRAFT   Clinical Impression   PTA, pt was independent with ADLs and mobility. Pt currently requires min (hand held) assist for functional transfers due to balance impairments from pain. Educated pt on back precautions, donning/doffing brace and wear protocol, positioning for sleep, compensatory strategies for ADLs, proper use of DME for transfers, energy conservation, and fall prevention strategies. Pt verbalized and demonstrated understanding of precautions and compensatory strategies and pt/daughter have no further questions. Pt plans to d/c with intermittent assistance from her boyfriend and daughters. Pt is adequate for discharge from occupational therapy standpoint. No OT follow up recommended - recommend RW and 3in1 for home use.    Follow Up Recommendations  No OT follow up;Supervision/Assistance - 24 hour    Equipment Recommendations  3 in 1 bedside comode;Other (comment) (RW-2 wheeled)    Recommendations for Other Services       Precautions / Restrictions Precautions Precautions: Back Precaution Booklet Issued: Yes (comment) Precaution Comments: Able to recall 2/3 back precautions. Reviewed precautions during activity and handout Required Braces or Orthoses: Spinal Brace Spinal Brace: Thoracolumbosacral orthotic;Applied in sitting position Restrictions Weight Bearing Restrictions: No      Mobility Bed Mobility Overal bed mobility: Needs Assistance Bed Mobility: Rolling;Sidelying to Sit Rolling: Min assist Sidelying to sit: Min guard       General bed mobility comments: HOB flat, no use of bedrails to simulate home environment. Min assist to achieve full sidelying position. Verbal cues for hand  placement and log roll technique. Pt had no recollection of learning this technique from PT session yesterday.  Transfers Overall transfer level: Needs assistance Equipment used: None Transfers: Sit to/from Stand Sit to Stand: Min assist         General transfer comment: Hand held assist to stand as pt was unable to achieve full standing without assist. Pt "furniture walking" while ambulating in room for ADLs.    Balance Overall balance assessment: Needs assistance Sitting-balance support: No upper extremity supported;Feet supported Sitting balance-Leahy Scale: Good     Standing balance support: No upper extremity supported;During functional activity Standing balance-Leahy Scale: Fair Standing balance comment: Able to maintain balance with static standing tasks, but reliant on 1 UE support for dynamic balance tasks                            ADL Overall ADL's : Needs assistance/impaired     Grooming: Wash/dry hands;Oral care;Supervision/safety;Cueing for compensatory techniques;Standing Grooming Details (indicate cue type and reason): Educated pt on 2 cup method for oral care         Upper Body Dressing : Moderate assistance;Cueing for sequencing;Sitting Upper Body Dressing Details (indicate cue type and reason): to don back brace Lower Body Dressing: Supervision/safety;Sit to/from stand;Cueing for compensatory techniques Lower Body Dressing Details (indicate cue type and reason): Able to cross ankle-over knee, cues to don underwear and pants to thighs and stand 1 time to pull up both Toilet Transfer: Min guard;Ambulation;BSC;Cueing for safety Toilet Transfer Details (indicate cue type and reason): BSC over toilet, cues to feel BSC on back of legs before reaching back for handles to sit to avoid twisting Toileting- Clothing Manipulation and Hygiene: Min guard;Cueing for compensatory techniques;Sit to/from stand Toileting - Water quality scientist Details (  indicate  cue type and reason): Cues to squat for pericare and to avoid twisting     Functional mobility during ADLs: Min guard General ADL Comments: Pt "furniture walking" throughout entire session and reported feeling unsteady on her feet - will benefit from RW. Educated pt on back precautions, donning/doffing back brace and wear protocol, compensatory strategies for ADLs, proper DME use for transfers, energy conservation, and fall prevention strategies.      Vision Vision Assessment?: No apparent visual deficits   Perception     Praxis      Pertinent Vitals/Pain Pain Assessment: 0-10 Pain Score: 4  Pain Location: back Pain Descriptors / Indicators: Sore Pain Intervention(s): Limited activity within patient's tolerance;Monitored during session;Repositioned;Premedicated before session     Hand Dominance Right   Extremity/Trunk Assessment Upper Extremity Assessment Upper Extremity Assessment: Overall WFL for tasks assessed   Lower Extremity Assessment Lower Extremity Assessment: Overall WFL for tasks assessed   Cervical / Trunk Assessment Cervical / Trunk Assessment: Normal   Communication Communication Communication: No difficulties   Cognition Arousal/Alertness: Awake/alert Behavior During Therapy: WFL for tasks assessed/performed Overall Cognitive Status: Within Functional Limits for tasks assessed       Memory: Decreased recall of precautions             General Comments       Exercises       Shoulder Instructions      Home Living Family/patient expects to be discharged to:: Private residence Living Arrangements: Spouse/significant other Available Help at Discharge: Family;Available PRN/intermittently Type of Home: House Home Access: Stairs to enter [employer] of Steps: 4 Entrance Stairs-Rails: None Home Layout: One level     Bathroom Shower/Tub: Tub/shower unit;Curtain Shower/tub characteristics: Architectural technologist: Standard      Home Equipment: None   Additional Comments: Pt's boyfriend works 2 jobs and is only home from 4-8 in the evenings. Pt's daughters can stop by intermittently to assist      Prior Functioning/Environment Level of Independence: Independent             OT Diagnosis: Acute pain   OT Problem List: Decreased strength;Decreased range of motion;Decreased activity tolerance;Impaired balance (sitting and/or standing);Decreased coordination;Decreased safety awareness;Decreased knowledge of use of DME or AE;Decreased knowledge of precautions;Pain   OT Treatment/Interventions:      OT Goals(Current goals can be found in the care plan section) Acute Rehab OT Goals Patient Stated Goal: to go home OT Goal Formulation: With patient Time For Goal Achievement: 09/08/15 Potential to Achieve Goals: Good  OT Frequency:     Barriers to D/C:            Co-evaluation              End of Session Equipment Utilized During Treatment: Gait belt;Rolling walker;Back brace Nurse Communication: Mobility status;Precautions  Activity Tolerance: Patient tolerated treatment well Patient left: in chair;with call bell/phone within reach;with family/visitor present   Time: 0820-0910 OT Time Calculation (min): 50 min Charges:  OT General Charges $OT Visit: 1 Procedure OT Evaluation $OT Eval Moderate Complexity: 1 Procedure OT Treatments $Self Care/Home Management : 23-37 mins G-Codes:    Redmond Baseman , OTR/L PagerUD:[phone]  08/25/2015, 1:08 PM

## 2015-08-25 NOTE — Progress Notes (Signed)
Patient alert and oriented, mae's well, voiding adequate amount of urine, swallowing without difficulty, no c/o pain. Patient discharged home with family. Script and discharged instructions given to patient. Patient and family stated understanding of d/c instructions given and has an appointment with MD. 

## 2015-08-25 NOTE — Progress Notes (Signed)
Physical Therapy Treatment Patient Details Name: [name] MRN: [id number] DOB: December 20, 1970 Today's Date: 08/25/2015    History of Present Illness Patient is a 45 yo female s/p LUMBAR 4-5 TRANSFORAMINAL LUMBAR INTERBODY FUSION WITH INSTRUMENTATION AND ALLOGRAFT    PT Comments    Great progress. Tolerates higher level dynamic gait tasks without loss of balance. Maintains and verbalizes back precautions without cues. Safely navigates stairs. Adequate for d/c from PT standpoint. Pt reports daughter will be assisting at home.  Follow Up Recommendations  Supervision for mobility/OOB     Equipment Recommendations  3in1 (PT)    Recommendations for Other Services       Precautions / Restrictions Precautions Precautions: Back Precaution Booklet Issued: Yes (comment) Precaution Comments: Verbalizes all precautions Required Braces or Orthoses: Spinal Brace Spinal Brace: Thoracolumbosacral orthotic Restrictions Weight Bearing Restrictions: No    Mobility  Bed Mobility               General bed mobility comments: in recliner when PT entered room. Declined to practice  Transfers Overall transfer level: Needs assistance Equipment used: Rolling walker (2 wheeled) Transfers: Sit to/from Stand Sit to Stand: Supervision         General transfer comment: supervision for safety VC for hand placement.  Ambulation/Gait Ambulation/Gait assistance: Supervision Ambulation Distance (Feet): 250 Feet Assistive device: None;Rolling walker (2 wheeled) Gait Pattern/deviations: Step-through pattern;Decreased stride length Gait velocity: decreased Gait velocity interpretation: Below normal speed for age/gender General Gait Details: Ambulates generally well. Tolerated higher level dynamic gait tasks without loss of balance including high marching, quick turns, backwards stepping, variable speeds, and horizontal/vertical head turns. Feels more confident with RW for support although pt  stable without, more guarded.   Stairs Stairs: Yes Stairs assistance: Min assist Stair Management: No rails;Step to pattern;Forwards Number of Stairs: 12 General stair comments: hand held support only to simulate home enviornment which daugher will be assisting with to enter home. VC for sequencing. Performed safely.  Wheelchair Mobility    Modified Rankin (Stroke Patients Only)       Balance Overall balance assessment: No apparent balance deficits (not formally assessed)                                  Cognition Arousal/Alertness: Awake/alert Behavior During Therapy: WFL for tasks assessed/performed Overall Cognitive Status: Within Functional Limits for tasks assessed                      Exercises      General Comments        Pertinent Vitals/Pain Pain Assessment: 0-10 Pain Score: 4  Pain Location: back Pain Descriptors / Indicators: Aching Pain Intervention(s): Monitored during session;Repositioned    Home Living                      Prior Function            PT Goals (current goals can now be found in the care plan section) Acute Rehab PT Goals Patient Stated Goal: to go home PT Goal Formulation: With patient Time For Goal Achievement: 09/07/15 Potential to Achieve Goals: Good Progress towards PT goals: Progressing toward goals    Frequency  Min 5X/week    PT Plan Current plan remains appropriate    Co-evaluation             End of Session Equipment Utilized During Treatment: Gait  belt;Back brace Activity Tolerance: Patient tolerated treatment well Patient left: with call bell/phone within reach;with family/visitor present;in chair     Time: 0944-1000 PT Time Calculation (min) (ACUTE ONLY): 16 min  Charges:  $Gait Training: 8-22 mins                    G Codes:      Ellouise Newer 2015/09/02, 10:16 AM Elayne Snare, [place]

## 2015-08-25 NOTE — Progress Notes (Signed)
    Patient doing well Patient denies R or L leg pain + expected LBP   Physical Exam: Filed Vitals:   08/24/15 2337 08/25/15 0400  BP: 93/46 104/58  Pulse: 77 80  Temp: 99.2 F (37.3 C) 99.6 F (37.6 C)  Resp: 18 20   Patient looks great, comfortable Dressing in place NVI  POD #1 s/p L4/5 TLIF, doing well  - up with PT/OT, encourage ambulation - Percocet for pain, Valium for muscle spasms - likely d/c home later today

## 2015-08-29 MED FILL — Sodium Chloride IV Soln 0.9%: INTRAVENOUS | Qty: 1000 | Status: AC

## 2015-08-29 MED FILL — Heparin Sodium (Porcine) Inj 1000 Unit/ML: INTRAMUSCULAR | Qty: 30 | Status: AC

## 2015-09-06 NOTE — Discharge Summary (Signed)
Patient ID: [name] MRN: [name] DOB/AGE: 1971-02-28 45 y.o.  Admit date: 08/24/2015 Discharge date: 08/25/2015  Admission Diagnoses:  Active Problems:   Degenerative disc disease, lumbar   Discharge Diagnoses:  Same  Past Medical History  Diagnosis Date  . Family history of adverse reaction to anesthesia     FATHER STOPS BREATHING WITH ANESTHESIA  . Seizures ([place])     1 SEIZURE  VERY STRESSED  . Diabetes mellitus without complication ([place])   . Headache     HX MIGRAINES  . Cancer ([place])     + CANCER CELLS TO UTERUS    Surgeries: Procedure(s): Left SIDED LUMBAR 4-5 TRANSFORAMINAL LUMBAR INTERBODY FUSION WITH INSTRUMENTATION AND ALLOGRAFT on 08/24/2015   Consultants:  None  Discharged Condition: Improved  Hospital Course: [name] is an 45 y.o. female who was admitted 08/24/2015 for operative treatment of radiculopathy. Patient has severe unremitting pain that affects sleep, daily activities, and work/hobbies. After pre-op clearance the patient was taken to the operating room on 08/24/2015 and underwent  Procedure(s): Left SIDED LUMBAR 4-5 TRANSFORAMINAL LUMBAR INTERBODY FUSION WITH INSTRUMENTATION AND ALLOGRAFT.    Patient was given perioperative antibiotics:  Anti-infectives    Start     Dose/Rate Route Frequency Ordered Stop   08/24/15 1600  ceFAZolin (ANCEF) IVPB 1 g/50 mL premix     1 g 100 mL/hr over 30 Minutes Intravenous Every 8 hours 08/24/15 1333 08/24/15 2356   08/24/15 0600  ceFAZolin (ANCEF) IVPB 2 g/50 mL premix     2 g 100 mL/hr over 30 Minutes Intravenous To [facility] 08/23/15 1042 08/24/15 0738       Patient was given sequential compression devices, early ambulation to prevent DVT.  Patient benefited maximally from hospital stay and there were no complications.    Recent vital signs: BP 109/64 mmHg  Pulse 80  Temp(Src) 98.8 F (37.1 C) (Oral)  Resp 18  SpO2 98%  Discharge Medications:     Medication List    STOP  taking these medications        acetaminophen 325 MG tablet  Commonly known as:  TYLENOL     diclofenac sodium 1 % Gel  Commonly known as:  VOLTAREN     HYDROcodone-acetaminophen 5-325 MG tablet  Commonly known as:  NORCO/VICODIN     meperidine 50 MG tablet  Commonly known as:  DEMEROL        Diagnostic Studies: Dg Chest 2 View  08/17/2015  CLINICAL DATA:  Patient preop for lumbar spine surgery. History of diabetes. History of smoking. EXAM: CHEST  2 VIEW COMPARISON:  12/12/2013 FINDINGS: The heart size and mediastinal contours are within normal limits. Both lungs are clear. No pleural effusion or pneumothorax. Skeletal structures are unremarkable. IMPRESSION: No active cardiopulmonary disease. Electronically Signed   By: Lajean Manes M.D.   On: 08/17/2015 16:06   Dg Lumbar Spine 2-3 Views  08/24/2015  CLINICAL DATA:  Lumbar fusion. EXAM: LUMBAR SPINE - 2-3 VIEW; DG C-ARM 61-120 MIN COMPARISON:  Lumbar spine 08/24/2015. FINDINGS: Lumbar vertebra number with the lowest segmented appearing vertebra on lateral view as L5. L4-L5 posterior interbody fusion with good anatomic alignment. Hardware intact. No acute bony abnormality. IMPRESSION: L4-L5 posterior and interbody fusion.  No acute abnormality . Electronically Signed   By: Marcello Moores  Register   On: 08/24/2015 10:51   Dg Lumbar Spine 1 View  08/24/2015  CLINICAL DATA:  Lumbar fusion. EXAM: LUMBAR SPINE - 1 VIEW COMPARISON:  CT  05/29/2015. FINDINGS: Lumbar vertebra number with the lowest segmented appearing lumbar shaped vertebra as L5. Metallic markers noted posteriorly at L2 and L4. No acute bony abnormality. Normal alignment. IMPRESSION: Negative. Electronically Signed   By: Marcello Moores  Register   On: 08/24/2015 09:38   Dg C-arm 1-60 Min  08/24/2015  CLINICAL DATA:  Lumbar fusion. EXAM: LUMBAR SPINE - 2-3 VIEW; DG C-ARM 61-120 MIN COMPARISON:  Lumbar spine 08/24/2015. FINDINGS: Lumbar vertebra number with the lowest segmented appearing vertebra  on lateral view as L5. L4-L5 posterior interbody fusion with good anatomic alignment. Hardware intact. No acute bony abnormality. IMPRESSION: L4-L5 posterior and interbody fusion.  No acute abnormality . Electronically Signed   By: Marcello Moores  Register   On: 08/24/2015 10:51    Disposition: 01-Home or Self Care   POD #1 s/p L4/5 TLIF, doing well  - up with PT/OT, encourage ambulation - Percocet for pain, Valium for muscle spasms -Written scripts for pain signed and in chart -D/C instructions sheet printed and in chart -D/C today  -F/U in office 2 weeks   Signed: Justice Britain 09/06/2015, 12:10 PM

## 2016-04-26 ENCOUNTER — Encounter: Payer: Self-pay | Admitting: Emergency Medicine

## 2016-04-26 DIAGNOSIS — E119 Type 2 diabetes mellitus without complications: Secondary | ICD-10-CM | POA: Diagnosis not present

## 2016-04-26 DIAGNOSIS — F17210 Nicotine dependence, cigarettes, uncomplicated: Secondary | ICD-10-CM | POA: Diagnosis not present

## 2016-04-26 DIAGNOSIS — M545 Low back pain: Secondary | ICD-10-CM | POA: Diagnosis present

## 2016-04-26 DIAGNOSIS — Z8542 Personal history of malignant neoplasm of other parts of uterus: Secondary | ICD-10-CM | POA: Insufficient documentation

## 2016-04-26 DIAGNOSIS — M5442 Lumbago with sciatica, left side: Secondary | ICD-10-CM | POA: Diagnosis not present

## 2016-04-26 LAB — URINALYSIS COMPLETE WITH MICROSCOPIC (ARMC ONLY)
BILIRUBIN URINE: NEGATIVE
Glucose, UA: NEGATIVE mg/dL
KETONES UR: NEGATIVE mg/dL
LEUKOCYTES UA: NEGATIVE
Nitrite: NEGATIVE
PH: 6 (ref 5.0–8.0)
PROTEIN: NEGATIVE mg/dL
SPECIFIC GRAVITY, URINE: 1.005 (ref 1.005–1.030)
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
## 2016-04-27 ENCOUNTER — Emergency Department
Admission: EM | Admit: 2016-04-27 | Discharge: 2016-04-27 | Disposition: A | Discharge status: Home / Self Care | Payer: Worker's Compensation | Attending: Emergency Medicine | Admitting: Emergency Medicine

## 2016-04-27 ENCOUNTER — Emergency Department: Payer: Worker's Compensation

## 2016-04-27 DIAGNOSIS — M5442 Lumbago with sciatica, left side: Secondary | ICD-10-CM

## 2016-04-27 MED ORDER — ONDANSETRON HCL 4 MG/2ML IJ SOLN
4.0000 mg | Freq: Once | INTRAMUSCULAR | Status: AC
  Administered 2016-04-27: 4 mg via INTRAVENOUS

## 2016-04-27 MED ORDER — DIPHENHYDRAMINE HCL 50 MG/ML IJ SOLN
25.0000 mg | Freq: Once | INTRAMUSCULAR | Status: AC
  Administered 2016-04-27: 25 mg via INTRAVENOUS

## 2016-04-27 MED ORDER — HYDROMORPHONE HCL 1 MG/ML IJ SOLN
INTRAMUSCULAR | Status: AC
  Administered 2016-04-27: 1 mg via INTRAVENOUS
  Filled 2016-04-27: qty 1

## 2016-04-27 MED ORDER — DIPHENHYDRAMINE HCL 50 MG/ML IJ SOLN
INTRAMUSCULAR | Status: AC
  Administered 2016-04-27: 25 mg via INTRAVENOUS
  Filled 2016-04-27: qty 1

## 2016-04-27 MED ORDER — HYDROMORPHONE HCL 1 MG/ML IJ SOLN
1.0000 mg | Freq: Once | INTRAMUSCULAR | Status: AC
  Administered 2016-04-27: 1 mg via INTRAVENOUS

## 2016-04-27 MED ORDER — CYCLOBENZAPRINE HCL 10 MG PO TABS: 10.0000 mg | ORAL_TABLET | Freq: Three times a day (TID) | ORAL | 0 refills | Status: DC | PRN

## 2016-04-27 MED ORDER — ONDANSETRON HCL 4 MG/2ML IJ SOLN
INTRAMUSCULAR | Status: AC
  Administered 2016-04-27: 4 mg via INTRAVENOUS
  Filled 2016-04-27: qty 2

## 2016-04-27 NOTE — ED Notes (Signed)
Pt. Stated they felt "itchy" about 5 min after medication.  Dr. Ree Kida and gave orders for benadryl.

## 2016-04-27 NOTE — ED Notes (Signed)
Pt. Was told by her lawyer to come to ED to get re-evaluated for pain management.

## 2016-04-27 NOTE — ED Notes (Signed)
Pt. Going home with friend 

## 2016-04-27 NOTE — ED Provider Notes (Signed)
[facility] Emergency Department Provider Note   First MD Initiated Contact with Patient 04/27/16 [phone]     (approximate)  I have reviewed the triage vital signs and the nursing notes.   HISTORY  Chief Complaint Back Pain   HPI [name] is a 45 y.o. female with history of below listed chronic medical conditions as well as L4-L5 fusion performed on 08/24/2015 by Dr. Lynann Sullivan presents to the emergency department with progressively worsening low back pain radiating down her left leg since surgery. Patient also admits to urinary incontinence. Patient states that she has been released from Dr. Laurena Sullivan care with her last appointment 1 month ago. Patient states that her lawyer attempted to make an appointment for her today with Dr. Bonita Sullivan skin without success. Patient states she was informed by her lawyer to present to the emergency department given her current 10 out of 10 pain. Patient denies any lower external weakness no numbness or gait instability. Patient denies any fever   Past Medical History:  Diagnosis Date  . Cancer (HCC)    + CANCER CELLS TO UTERUS  . Diabetes mellitus without complication ([place])   . Family history of adverse reaction to anesthesia    FATHER STOPS BREATHING WITH ANESTHESIA  . Headache    HX MIGRAINES  . Seizures ([place])    1 SEIZURE  VERY STRESSED    Patient Active Problem List   Diagnosis Date Noted  . Degenerative disc disease, lumbar 08/24/2015    Past Surgical History:  Procedure Laterality Date  . ABDOMINAL HYSTERECTOMY    . APPENDECTOMY      Prior to Admission medications   Medication Sig Start Date End Date Taking? Authorizing Provider  cyclobenzaprine (FLEXERIL) 10 MG tablet Take 1 tablet (10 mg total) by mouth 3 (three) times daily as needed. 04/27/16   [name], MD    Allergies Chlorhexidine; Demerol [meperidine]; and Morphine and related  No family history on file.  Social History Social  History  Substance Use Topics  . Smoking status: Current Every Day Smoker    Packs/day: 0.50    Years: 28.00    Types: Cigarettes  . Smokeless tobacco: Never Used  . Alcohol use No    Review of Systems Constitutional: No fever/chills Eyes: No visual changes. ENT: No sore throat. Cardiovascular: Denies chest pain. Respiratory: Denies shortness of breath. Gastrointestinal: No abdominal pain.  No nausea, no vomiting.  No diarrhea.  No constipation. Genitourinary: Negative for dysuria. Musculoskeletal: Negative for back pain. Skin: Negative for rash. Neurological: Negative for headaches, focal weakness or numbness.  10-point ROS otherwise negative.  ____________________________________________   PHYSICAL EXAM:  VITAL SIGNS: ED Triage Vitals  Enc Vitals Group     BP 04/26/16 2054 137/78     Pulse Rate 04/26/16 2054 (!) 101     Resp 04/26/16 2054 18     Temp 04/26/16 2054 98.4 F (36.9 C)     Temp Source 04/26/16 2054 Oral     SpO2 04/26/16 2054 99 %     Weight 04/26/16 2055 160 lb (72.6 kg)     Height 04/26/16 2055 5\' 4"  (1.626 m)     Head Circumference --      Peak Flow --      Pain Score 04/26/16 2120 10     Pain Loc --      Pain Edu? --      Excl. in [place]? --     Constitutional: Alert and oriented.  Well appearing and in no acute distress. Eyes: Conjunctivae are normal. PERRL. EOMI. Head: Atraumatic. Ears:  Healthy appearing ear canals and TMs bilaterally Nose: No congestion/rhinnorhea. Mouth/Throat: Mucous membranes are moist.  Oropharynx non-erythematous. Neck: No stridor.  No meningeal signs.  No cervical spine tenderness to palpation. Cardiovascular: Normal rate, regular rhythm. Good peripheral circulation. Grossly normal heart sounds. Respiratory: Normal respiratory effort.  No retractions. Lungs CTAB. Gastrointestinal: Soft and nontender. No distention.  Musculoskeletal: No lower extremity tenderness nor edema. No gross deformities of  extremities. Neurologic:  Normal speech and language. No gross focal neurologic deficits are appreciated.  Skin:  Skin is warm, dry and intact. No rash noted. Psychiatric: Mood and affect are normal. Speech and behavior are normal.  ____________________________________________   LABS (all labs ordered are listed, but only abnormal results are displayed)  Labs Reviewed  URINALYSIS COMPLETEWITH MICROSCOPIC ([place]) - Abnormal; Notable for the following:       Result Value   Color, Urine STRAW (*)    APPearance CLEAR (*)    Hgb urine dipstick 2+ (*)    Bacteria, UA RARE (*)    Squamous Epithelial / LPF 0-5 (*)    All other components within normal limits     RADIOLOGY I, [place] BROWN, personally viewed and evaluated these images (plain radiographs) as part of my medical decision making, as well as reviewing the written report by the radiologist.  Ct Lumbar Spine Wo Contrast  Result Date: 04/27/2016 CLINICAL DATA:  Low back pain.  History of L4-5 spinal fusion. EXAM: CT LUMBAR SPINE WITHOUT CONTRAST TECHNIQUE: Multidetector CT imaging of the lumbar spine was performed without intravenous contrast administration. Multiplanar CT image reconstructions were also generated. COMPARISON:  CT lumbar spine 05/29/2015, MRI lumbar spine 01/25/2015 FINDINGS: Segmentation: There is transitional lumbosacral anatomy with bilateral assimilation joints at L5. The fused levels are considered to be L4 and L5. Alignment: Normal Vertebrae: There is L4-L5 posterior spinal fusion with bilateral transpedicular screws and spinal rods. There is no abnormal lucency surrounding the hardware. There is disc spacer material at L4-L5 without complete osseous fusion. No significant subsidence. Paraspinal and other soft tissues: Negative Disc levels: The L3-L4 level and above are normal. There are postfusion changes at L4-L5 without spinal canal or neural foraminal stenosis. Alignment is normal. There is no spinal  canal or foraminal stenosis at L5-S1. IMPRESSION: 1. Status post L4-L5 posterior spinal fusion without focal hardware abnormality. 2. No spinal canal or neural foraminal stenosis. Electronically Signed   By: Ulyses Jarred M.D.   On: 04/27/2016 02:53     Procedures    INITIAL IMPRESSION / ASSESSMENT AND PLAN / ED COURSE  Pertinent labs & imaging results that were available during my care of the patient were reviewed by me and considered in my medical decision making (see chart for details).  Patient given IV Dilaudid in the emergency department with Zofran with improvement of pain. On my return to the room for reevaluation the patient was sleeping upon arousal patient states that her pain was improved. I informed the patient the CT scan findings   Clinical Course    ____________________________________________  FINAL CLINICAL IMPRESSION(S) / ED DIAGNOSES  Final diagnoses:  Acute left-sided low back pain with left-sided sciatica     MEDICATIONS GIVEN DURING THIS VISIT:  Medications  HYDROmorphone (DILAUDID) injection 1 mg (1 mg Intravenous Given 04/27/16 0142)  ondansetron (ZOFRAN) injection 4 mg (4 mg Intravenous Given 04/27/16 0142)  diphenhydrAMINE (BENADRYL) injection 25 mg (25  mg Intravenous Given 04/27/16 0205)     NEW OUTPATIENT MEDICATIONS STARTED DURING THIS VISIT:  New Prescriptions   CYCLOBENZAPRINE (FLEXERIL) 10 MG TABLET    Take 1 tablet (10 mg total) by mouth 3 (three) times daily as needed.    Modified Medications   No medications on file    Discontinued Medications   No medications on file     Note:  This document was prepared using Dragon voice recognition software and may include unintentional dictation errors.    [name], MD 04/27/16 [phone]

## 2016-04-27 NOTE — ED Notes (Signed)
Pt. States pain to lower lt. Side of back.  Pt. States pain is to left and below incision site.  Pt. States difficulty controlling urine.

## 2016-05-03 MED ORDER — FENTANYL CITRATE (PF) 100 MCG/2ML IJ SOLN
INTRAMUSCULAR | Status: AC
  Filled 2016-05-03: qty 2

## 2016-05-03 MED ORDER — MIDAZOLAM HCL 5 MG/5ML IJ SOLN
INTRAMUSCULAR | Status: AC
  Filled 2016-05-03: qty 5

## 2016-05-03 MED ORDER — HEPARIN SODIUM (PORCINE) 1000 UNIT/ML IJ SOLN
INTRAMUSCULAR | Status: AC
  Filled 2016-05-03: qty 1

## 2016-05-13 ENCOUNTER — Other Ambulatory Visit: Payer: Self-pay | Admitting: Orthopedic Surgery

## 2016-05-13 DIAGNOSIS — M545 Low back pain: Secondary | ICD-10-CM

## 2016-05-15 ENCOUNTER — Ambulatory Visit
Admission: RE | Admit: 2016-05-15 | Discharge: 2016-05-15 | Disposition: A | Discharge status: Home / Self Care | Payer: Worker's Compensation | Source: Ambulatory Visit | Attending: Orthopedic Surgery | Admitting: Orthopedic Surgery

## 2016-05-15 DIAGNOSIS — M545 Low back pain: Secondary | ICD-10-CM

## 2016-06-08 ENCOUNTER — Encounter: Payer: Self-pay | Admitting: Emergency Medicine

## 2016-06-08 ENCOUNTER — Emergency Department: Payer: Self-pay

## 2016-06-08 ENCOUNTER — Emergency Department
Admission: EM | Admit: 2016-06-08 | Discharge: 2016-06-08 | Disposition: A | Discharge status: Home / Self Care | Payer: Self-pay | Attending: Emergency Medicine | Admitting: Emergency Medicine

## 2016-06-08 DIAGNOSIS — E119 Type 2 diabetes mellitus without complications: Secondary | ICD-10-CM | POA: Insufficient documentation

## 2016-06-08 DIAGNOSIS — Z8542 Personal history of malignant neoplasm of other parts of uterus: Secondary | ICD-10-CM | POA: Insufficient documentation

## 2016-06-08 DIAGNOSIS — J189 Pneumonia, unspecified organism: Secondary | ICD-10-CM | POA: Insufficient documentation

## 2016-06-08 DIAGNOSIS — N939 Abnormal uterine and vaginal bleeding, unspecified: Secondary | ICD-10-CM | POA: Insufficient documentation

## 2016-06-08 DIAGNOSIS — F17210 Nicotine dependence, cigarettes, uncomplicated: Secondary | ICD-10-CM | POA: Insufficient documentation

## 2016-06-08 DIAGNOSIS — R9431 Abnormal electrocardiogram [ECG] [EKG]: Secondary | ICD-10-CM | POA: Insufficient documentation

## 2016-06-08 DIAGNOSIS — Z79899 Other long term (current) drug therapy: Secondary | ICD-10-CM | POA: Insufficient documentation

## 2016-06-08 LAB — CBC WITH DIFFERENTIAL/PLATELET
Basophils Absolute: 0.1 10*3/uL (ref 0–0.1)
Basophils Relative: 1 %
EOS ABS: 0.2 10*3/uL (ref 0–0.7)
EOS PCT: 2 %
HCT: 42.1 % (ref 35.0–47.0)
Hemoglobin: 14.3 g/dL (ref 12.0–16.0)
LYMPHS ABS: 5.2 10*3/uL — AB (ref 1.0–3.6)
Lymphocytes Relative: 44 %
MCH: 32 pg (ref 26.0–34.0)
MCHC: 34 g/dL (ref 32.0–36.0)
MCV: 94.3 fL (ref 80.0–100.0)
Monocytes Absolute: 0.6 10*3/uL (ref 0.2–0.9)
Monocytes Relative: 5 %
Neutro Abs: 5.7 10*3/uL (ref 1.4–6.5)
Neutrophils Relative %: 48 %
PLATELETS: 302 10*3/uL (ref 150–440)
RBC: 4.47 MIL/uL (ref 3.80–5.20)
RDW: 13.1 % (ref 11.5–14.5)
WBC: 11.8 10*3/uL — AB (ref 3.6–11.0)

## 2016-06-08 LAB — COMPREHENSIVE METABOLIC PANEL
ALT: 13 U/L — AB (ref 14–54)
ANION GAP: 6 (ref 5–15)
AST: 15 U/L (ref 15–41)
Albumin: 4.4 g/dL (ref 3.5–5.0)
Alkaline Phosphatase: 93 U/L (ref 38–126)
BUN: 7 mg/dL (ref 6–20)
CHLORIDE: 101 mmol/L (ref 101–111)
CO2: 28 mmol/L (ref 22–32)
Calcium: 9.6 mg/dL (ref 8.9–10.3)
Creatinine, Ser: 0.81 mg/dL (ref 0.44–1.00)
GFR calc non Af Amer: >60 mL/min (ref >60)
Glucose, Bld: 110 mg/dL — ABNORMAL HIGH (ref 65–99)
POTASSIUM: 3.6 mmol/L (ref 3.5–5.1)
SODIUM: 135 mmol/L (ref 135–145)
Total Bilirubin: 0.6 mg/dL (ref 0.3–1.2)
Total Protein: 7.9 g/dL (ref 6.5–8.1)

## 2016-06-08 LAB — URINALYSIS, COMPLETE (UACMP) WITH MICROSCOPIC
BACTERIA UA: NONE SEEN
BILIRUBIN URINE: NEGATIVE
Glucose, UA: NEGATIVE mg/dL
KETONES UR: NEGATIVE mg/dL
LEUKOCYTES UA: NEGATIVE
NITRITE: NEGATIVE
Protein, ur: NEGATIVE mg/dL
Specific Gravity, Urine: 1.001 — ABNORMAL LOW (ref 1.005–1.030)
WBC UA: NONE SEEN WBC/hpf (ref 0–5)
pH: 6 (ref 5.0–8.0)

## 2016-06-08 LAB — TROPONIN I: Troponin I: <0.03 ng/mL (ref <0.03)

## 2016-06-08 MED ORDER — IOPAMIDOL (ISOVUE-300) INJECTION 61%
75.0000 mL | Freq: Once | INTRAVENOUS | Status: AC | PRN
  Administered 2016-06-08: 75 mL via INTRAVENOUS

## 2016-06-08 MED ORDER — AZITHROMYCIN 500 MG PO TABS
500.0000 mg | ORAL_TABLET | Freq: Once | ORAL | Status: AC
  Administered 2016-06-08: 500 mg via ORAL
  Filled 2016-06-08: qty 1

## 2016-06-08 MED ORDER — AZITHROMYCIN 250 MG PO TABS: ORAL_TABLET | ORAL | 0 refills | Status: DC

## 2016-06-08 MED ORDER — IBUPROFEN 600 MG PO TABS
600.0000 mg | ORAL_TABLET | ORAL | Status: AC
  Administered 2016-06-08: 600 mg via ORAL
  Filled 2016-06-08: qty 1

## 2016-06-08 NOTE — ED Notes (Signed)
Patient is also complaining of chest pain and weakness.

## 2016-06-08 NOTE — ED Notes (Signed)
Report given to Jordan,RN

## 2016-06-08 NOTE — Discharge Instructions (Signed)
You have a concerning area seen on your CT scan in your left breast, PLEASE MAKE SURE TO GET FOLLOW-UP with OBGYN or your PRIMARY DOCTOR for a mammogram and further follow-up on this within a month to assure you don't have a concerning tumor in the breast. _______ You have been seen in the Emergency Department (ED) today for chest pain.  As we have discussed today?s test results are normal, but you may require further testing.  Please follow up with the recommended doctor as instructed above in these documents regarding today?s emergent visit and your recent symptoms to discuss further management.  Continue to take your regular medications. If you are not doing so already, please also take a daily baby aspirin (81 mg), at least until you follow up with your doctor.  Return to the Emergency Department (ED) if you experience any further chest pain/pressure/tightness, difficulty breathing, or sudden sweating, or other symptoms that concern you.   Please follow up closely with obstetrics and gynecology or your primary doctor.  Return to the emergency room if your bleeding worsens, you become weak and dizzy or lightheaded, you have an episode of passing out, develop severe bleeding such as more than 1 soaked pad per hour for more than 3 straight hours, develop abdominal or pelvic pain, fevers chills or other new concerns arise.

## 2016-06-08 NOTE — ED Notes (Signed)
This RN to bedside at this time. Pt reports blood in toilet from a previous patient. This RN apologized profusely to patient and friend at bedside and cleaned toilet. Pt then up to go to the bathroom at this time, NAD noted. Pt is independent with ADL's. This RN explained would return when patient was done with toileting, t states understanding.

## 2016-06-08 NOTE — ED Triage Notes (Signed)
C/o vaginal bleeding, hx of hysterectomy

## 2016-06-08 NOTE — ED Provider Notes (Signed)
[facility] Emergency Department Provider Note   ____________________________________________   First MD Initiated Contact with Patient 06/08/16 2110     (approximate)  I have reviewed the triage vital signs and the nursing notes.   HISTORY  Chief Complaint Vaginal Bleeding    HPI [name] is a 45 y.o. female history of previous hysterectomy, total hysterectomy, who presents today for vaginal spotting for 2 months. Denies pregnancy. She reports she would normally followed up, but she does not have insurance and thus she comes today to have this further evaluated. Describes bleeding that is less than 1 pad every few hours, and she is also had some slight achiness in the chest that is persisted off and on for about 6 months. Described as a slight discomfort, nothing seems to make it better or worse. No exertional worsening. No pressure in the chest.  She is a smoker but denies any history of heart disease.   Past Medical History:  Diagnosis Date  . Cancer (HCC)    + CANCER CELLS TO UTERUS  . Diabetes mellitus without complication ([place])   . Family history of adverse reaction to anesthesia    FATHER STOPS BREATHING WITH ANESTHESIA  . Headache    HX MIGRAINES  . Seizures ([place])    1 SEIZURE  VERY STRESSED    Patient Active Problem List   Diagnosis Date Noted  . Degenerative disc disease, lumbar 08/24/2015    Past Surgical History:  Procedure Laterality Date  . ABDOMINAL HYSTERECTOMY    . APPENDECTOMY      Prior to Admission medications   Medication Sig Start Date End Date Taking? Authorizing Provider  azithromycin (ZITHROMAX Z-PAK) 250 MG tablet 1 tab by mouth daily 06/08/16   Delman Kitten, MD  cyclobenzaprine (FLEXERIL) 10 MG tablet Take 1 tablet (10 mg total) by mouth 3 (three) times daily as needed. 04/27/16   [name], MD    Allergies Chlorhexidine; Demerol [meperidine]; and Morphine and related  No family history on  file.  Social History Social History  Substance Use Topics  . Smoking status: Current Every Day Smoker    Packs/day: 0.50    Years: 28.00    Types: Cigarettes  . Smokeless tobacco: Never Used  . Alcohol use No    Review of Systems Constitutional: No fever/chills Eyes: No visual changes. ENT: No sore throat. Cardiovascular: Denies chest painThough she does describe some discomfort in the chest at times. Respiratory: Denies shortness of breath. Gastrointestinal: No abdominal pain.  No nausea, no vomiting.  No diarrhea.  No constipation. See history of present illness regarding vaginal bleeding Genitourinary: Negative for dysuria. No blood in her stool or black stool. Musculoskeletal: Negative for back pain. Skin: Negative for rash. Neurological: Negative for headaches, focal weakness or numbness.  10-point ROS otherwise negative.  ____________________________________________   PHYSICAL EXAM:  VITAL SIGNS: ED Triage Vitals  Enc Vitals Group     BP 06/08/16 1619 (!) 139/93     Pulse Rate 06/08/16 1619 88     Resp 06/08/16 1619 18     Temp 06/08/16 1619 97.5 F (36.4 C)     Temp Source 06/08/16 1619 Oral     SpO2 06/08/16 1619 100 %     Weight 06/08/16 1619 160 lb (72.6 kg)     Height 06/08/16 1619 5\' 4"  (1.626 m)     Head Circumference --      Peak Flow --      Pain  Score 06/08/16 1617 2     Pain Loc --      Pain Edu? --      Excl. in [place]? --     Constitutional: Alert and oriented. Well appearing and in no acute distress. Eyes: Conjunctivae are normal. PERRL. EOMI. Head: Atraumatic. Nose: No congestion/rhinnorhea. Mouth/Throat: Mucous membranes are moist.  Oropharynx non-erythematous. Neck: No stridor.   Cardiovascular: Normal rate, regular rhythm. Grossly normal heart sounds.  Good peripheral circulation. Respiratory: Normal respiratory effort.  No retractions. Lungs CTAB. Gastrointestinal: Soft and nontender. No distention.  Musculoskeletal: No lower  extremity tenderness nor edema.  No joint effusions. Neurologic:  Normal speech and language. No gross focal neurologic deficits are appreciated.  Skin:  Skin is warm, dry and intact. No rash noted. Psychiatric: Mood and affect are normal. Speech and behavior are normal.  ____________________________________________   LABS (all labs ordered are listed, but only abnormal results are displayed)  Labs Reviewed  CBC WITH DIFFERENTIAL/PLATELET - Abnormal; Notable for the following:       Result Value   WBC 11.8 (*)    Lymphs Abs 5.2 (*)    All other components within normal limits  COMPREHENSIVE METABOLIC PANEL - Abnormal; Notable for the following:    Glucose, Bld 110 (*)    ALT 13 (*)    All other components within normal limits  URINALYSIS, COMPLETE (UACMP) WITH MICROSCOPIC - Abnormal; Notable for the following:    Color, Urine STRAW (*)    APPearance CLEAR (*)    Specific Gravity, Urine 1.001 (*)    Hgb urine dipstick SMALL (*)    Squamous Epithelial / LPF 0-5 (*)    All other components within normal limits  TROPONIN I   ____________________________________________  EKG  Reviewed and interpreted by me at 1630 Than sugar 80 QRS 90 QTc 450 Normal sinus rhythm, mild T wave abnormality seen in inferolateral lateral distribution. Compared with the patient's previous EKG of 08/17/2015 no acute changes noted, previous T-wave abnormalities persist. Possibly do somewhat due to a right bundle branch type morphology ____________________________________________  RADIOLOGY  Dg Chest 2 View  Result Date: 06/08/2016 CLINICAL DATA:  New onset of chest pain and epigastric pain. EXAM: CHEST  2 VIEW COMPARISON:  None. FINDINGS: Cardiomediastinal silhouette is normal. Mediastinal contours appear intact. There is prominence is spiculated appearance of the left hilum. There is no evidence of focal airspace consolidation, pleural effusion or pneumothorax. Osseous structures are without acute  abnormality. Soft tissues are grossly normal. IMPRESSION: Prominence in spiculated appearance of the left hilum. This may represent overlapping vascular shadows, however pulmonary mass cannot be excluded. Repeated radiography or follow-up with chest CT may be considered. Electronically Signed   By: Fidela Salisbury M.D.   On: 06/08/2016 17:17   Ct Chest W Contrast  Result Date: 06/08/2016 CLINICAL DATA:  Abnormal chest x-ray.  Chest pain. EXAM: CT CHEST WITH CONTRAST TECHNIQUE: Multidetector CT imaging of the chest was performed during intravenous contrast administration. CONTRAST:  65mL ISOVUE-300 IOPAMIDOL (ISOVUE-300) INJECTION 61% COMPARISON:  Chest x-ray from earlier today FINDINGS: Cardiovascular: The thoracic aorta is non aneurysmal. Heart size is normal. The visualized pulmonary arteries are normal. Mediastinum/Nodes: No adenopathy. The thyroid gland, trachea, and esophagus are normal. Question small mass in the lateral left breast measuring 8 mm on series 2, image 73. No other abnormalities. Lungs/Pleura: The central airways are normal. Mild focal ground-glass adjacent to the posterior aspect of the left hilum on series 3, image 80 is identified, possibly  atelectasis or mild inflammation/infection. No mass identified. No suspicious nodules or masses identified. Upper Abdomen: No acute abnormality. Musculoskeletal: No chest wall abnormality. No acute or significant osseous findings. IMPRESSION: 1. Mild focal ground-glass adjacent to the left hilum could represent atelectasis or subtle inflammation/infection. Recommend follow-up as clinically warranted. No suspicious mass. 2. Question 8 mm mass in the left breast. Recommend correlation with mammography. Electronically Signed   By: Dorise Bullion III M.D   On: 06/08/2016 22:14    ____________________________________________   PROCEDURES  Procedure(s) performed: None  Procedures  Critical Care performed:  No  ____________________________________________   INITIAL IMPRESSION / ASSESSMENT AND PLAN / ED COURSE  Pertinent labs & imaging results that were available during my care of the patient were reviewed by me and considered in my medical decision making (see chart for details).  Patient presents for evaluation of vaginal bleeding. She's been having intermittent spotting for about 2-1/2 months, and is status post hysterectomy. She is hemodynamically stable with stable hemoglobin. She reports some minimal bleeding at this time, and I discussed with her and recommended she follow up outpatient with [facility]. She denies any rectal bleeding, no pain, and no infectious symptoms. Reassuring clinical examination.  In addition, the patient reports she has had aching in her chest for several months, at least since the time of her recent back surgery. She states sometimes it radiates up towards her chest from her back, and feels like an achy discomfort. Her EKG is abnormal, it appears this is chronic without acute change in her troponin is normal. No typical symptoms of ACS. Discussed with the patient recommended outpatient follow-up with cardiology and careful return precautions. Condition CT was performed to evaluate for an exclude possible chest or lung mass given the patient's smoking status and x-ray findings. This does not reveal a mass, but questionably some area of pneumonitis which we will treat with Z-Pak, unclear the exact etiology but again advised close follow-up with the primary and return precautions. Also discussed and recommended with the patient that she needs follow-up for a possible breast nodule on the left, she is agreeable with this and we had an extensive conversation about setting up a primary doctor, follow up with gynecology, follow-up with cardiology, and also discussing with financial aid services here.  Return precautions and treatment recommendations and follow-up discussed  with the patient who is agreeable with the plan.   Clinical Course      ____________________________________________   FINAL CLINICAL IMPRESSION(S) / ED DIAGNOSES  Final diagnoses:  Pneumonitis  Vaginal spotting  Abnormal EKG      NEW MEDICATIONS STARTED DURING THIS VISIT:  New Prescriptions   AZITHROMYCIN (ZITHROMAX Z-PAK) 250 MG TABLET    1 tab by mouth daily     Note:  This document was prepared using Dragon voice recognition software and may include unintentional dictation errors.     Delman Kitten, MD 06/08/16 [phone]

## 2016-06-08 NOTE — ED Notes (Signed)
This RN to bedside at this time, explained to patient this RN would be handing off patient at 11pm. Pt states understanding at this time. NAD noted. Pt's friend remains at bedside at this time.

## 2016-07-17 ENCOUNTER — Ambulatory Visit
Admission: RE | Admit: 2016-07-17 | Discharge: 2016-07-17 | Disposition: A | Discharge status: Home / Self Care | Payer: Self-pay | Source: Ambulatory Visit | Attending: Oncology | Admitting: Oncology

## 2016-07-17 ENCOUNTER — Ambulatory Visit: Payer: Self-pay | Attending: Oncology

## 2016-07-17 ENCOUNTER — Encounter (INDEPENDENT_AMBULATORY_CARE_PROVIDER_SITE_OTHER): Payer: Self-pay

## 2016-07-17 VITALS — BP 149/93 | HR 96 | Temp 98.0°F | Ht 64.17 in | Wt 166.3 lb

## 2016-07-17 DIAGNOSIS — N63 Unspecified lump in unspecified breast: Secondary | ICD-10-CM

## 2016-07-17 NOTE — Progress Notes (Signed)
Subjective:     Patient ID: [name], female   DOB: 1970-10-26, 46 y.o.   MRN: [name]  HPI   Review of Systems     Objective:   Physical Exam  Pulmonary/Chest: Right breast exhibits no inverted nipple, no mass, no nipple discharge, no skin change and no tenderness. Left breast exhibits no inverted nipple, no mass, no nipple discharge, no skin change and no tenderness. Breasts are symmetrical.       Assessment:     46 year old patient referred to [facility] for 33mm left breast mass found on chest CT 06/08/16.  Patient screened, and meets BCCCP eligibility.  Patient does not have insurance, Medicare or Medicaid.  Handout given on Affordable Care Act.Instructed patient on breast self-exam using teach back method.  CBE unremarkable.  No mass or lump palpated.  Patient is being followed by primary physician for post hysterectomy vaginal bleeding, and left chest pain.  Mother present for exam.    Plan:     Sent for bilateral diagnostic mammogram, and ultrasound.

## 2016-07-29 NOTE — Progress Notes (Signed)
Letter mailed from [facility] to notify of normal mammogram results.  Patient to return in one year for annual screening.  Copy to [facility]. 

## 2016-11-10 ENCOUNTER — Encounter: Payer: Self-pay | Admitting: Emergency Medicine

## 2016-11-10 ENCOUNTER — Emergency Department: Payer: Self-pay

## 2016-11-10 ENCOUNTER — Emergency Department
Admission: EM | Admit: 2016-11-10 | Discharge: 2016-11-10 | Disposition: A | Discharge status: Home / Self Care | Payer: Self-pay | Attending: Emergency Medicine | Admitting: Emergency Medicine

## 2016-11-10 DIAGNOSIS — G8929 Other chronic pain: Secondary | ICD-10-CM

## 2016-11-10 DIAGNOSIS — M5441 Lumbago with sciatica, right side: Secondary | ICD-10-CM | POA: Insufficient documentation

## 2016-11-10 DIAGNOSIS — M5442 Lumbago with sciatica, left side: Secondary | ICD-10-CM | POA: Insufficient documentation

## 2016-11-10 DIAGNOSIS — E119 Type 2 diabetes mellitus without complications: Secondary | ICD-10-CM | POA: Insufficient documentation

## 2016-11-10 DIAGNOSIS — F17210 Nicotine dependence, cigarettes, uncomplicated: Secondary | ICD-10-CM | POA: Insufficient documentation

## 2016-11-10 DIAGNOSIS — L02215 Cutaneous abscess of perineum: Secondary | ICD-10-CM | POA: Insufficient documentation

## 2016-11-10 LAB — URINALYSIS, COMPLETE (UACMP) WITH MICROSCOPIC
BACTERIA UA: NONE SEEN
BILIRUBIN URINE: NEGATIVE
Glucose, UA: NEGATIVE mg/dL
KETONES UR: NEGATIVE mg/dL
LEUKOCYTES UA: NEGATIVE
Nitrite: NEGATIVE
PROTEIN: NEGATIVE mg/dL
Specific Gravity, Urine: 1.012 (ref 1.005–1.030)
pH: 6 (ref 5.0–8.0)

## 2016-11-10 LAB — POCT PREGNANCY, URINE: PREG TEST UR: NEGATIVE

## 2016-11-10 MED ORDER — METHOCARBAMOL 500 MG PO TABS: 500.0000 mg | ORAL_TABLET | Freq: Four times a day (QID) | ORAL | 0 refills | Status: DC

## 2016-11-10 MED ORDER — TRAMADOL HCL 50 MG PO TABS
50.0000 mg | ORAL_TABLET | Freq: Once | ORAL | Status: AC
  Administered 2016-11-10: 50 mg via ORAL
  Filled 2016-11-10: qty 1

## 2016-11-10 MED ORDER — ACETAMINOPHEN 500 MG PO TABS
1000.0000 mg | ORAL_TABLET | Freq: Once | ORAL | Status: AC
  Administered 2016-11-10: 1000 mg via ORAL
  Filled 2016-11-10: qty 2

## 2016-11-10 MED ORDER — LIDOCAINE 4 % EX CREA
TOPICAL_CREAM | Freq: Once | CUTANEOUS | Status: AC
  Administered 2016-11-10: 17:00:00 via TOPICAL
  Filled 2016-11-10: qty 5

## 2016-11-10 MED ORDER — SULFAMETHOXAZOLE-TRIMETHOPRIM 800-160 MG PO TABS: 1.0000 | ORAL_TABLET | Freq: Two times a day (BID) | ORAL | 0 refills | Status: AC

## 2016-11-10 MED ORDER — IBUPROFEN 600 MG PO TABS: 600.0000 mg | ORAL_TABLET | Freq: Four times a day (QID) | ORAL | 0 refills | Status: DC | PRN

## 2016-11-10 NOTE — ED Notes (Signed)

## 2016-11-10 NOTE — ED Provider Notes (Signed)
[facility] Emergency Department Provider Note  ____________________________________________  Time seen: Approximately 4:03 PM  I have reviewed the triage vital signs and the nursing notes.   HISTORY  Chief Complaint Back Pain and Abscess   HPI LATINA FRANK is a 46 y.o. female with a history of degenerative disc disease status postL4-5 fusion in 08/2015 who presents for evaluation of back pain and abscess in her vagina. Patient reports that she has had low back pain that is sharp, severe, located in her lumbar region, radiating down her left leg. The pain has been constant and unchanged for over a year. She tells me that she came in today because she's had an abscess in her left labia that has been going on for 2 weeks and since she was coming for that she also wanted to be evaluated for her back pain. She denies saddle anesthesia, urinary or bowel incontinence or retention, weakness of her lower extremities. She does endorse intermittent numbness of the soles of her feet and because of that intermittent numbness she has had a few falls recently. She is being evaluated by another surgeon for second opinion. She denies trauma to her back. Fever or chills. Unintentional weight loss. IV drug use.  Past Medical History:  Diagnosis Date  . Cancer (HCC)    + CANCER CELLS TO UTERUS  . Diabetes mellitus without complication ([place])   . Family history of adverse reaction to anesthesia    FATHER STOPS BREATHING WITH ANESTHESIA  . Headache    HX MIGRAINES  . Seizures ([place])    1 SEIZURE  VERY STRESSED    Patient Active Problem List   Diagnosis Date Noted  . Degenerative disc disease, lumbar 08/24/2015    Past Surgical History:  Procedure Laterality Date  . ABDOMINAL HYSTERECTOMY    . APPENDECTOMY    . BACK SURGERY      Prior to Admission medications   Medication Sig Start Date End Date Taking? Authorizing Provider  azithromycin (ZITHROMAX Z-PAK) 250 MG  tablet 1 tab by mouth daily 06/08/16   Delman Kitten, MD  cyclobenzaprine (FLEXERIL) 10 MG tablet Take 1 tablet (10 mg total) by mouth 3 (three) times daily as needed. 04/27/16   [name], MD  ibuprofen (ADVIL,MOTRIN) 600 MG tablet Take 1 tablet (600 mg total) by mouth every 6 (six) hours as needed. 11/10/16   Rudene Re, MD  methocarbamol (ROBAXIN) 500 MG tablet Take 1 tablet (500 mg total) by mouth 4 (four) times daily. 11/10/16   Rudene Re, MD  sulfamethoxazole-trimethoprim (BACTRIM DS,SEPTRA DS) 800-160 MG tablet Take 1 tablet by mouth 2 (two) times daily. 11/10/16 11/17/16  Rudene Re, MD    Allergies Chlorhexidine; Demerol [meperidine]; and Morphine and related  Family History  Problem Relation Age of Onset  . Breast cancer Maternal Grandmother 30  . Breast cancer Paternal Grandfather 10    Social History Social History  Substance Use Topics  . Smoking status: Current Every Day Smoker    Packs/day: 0.50    Years: 28.00    Types: Cigarettes  . Smokeless tobacco: Never Used  . Alcohol use No    Review of Systems  Constitutional: Negative for fever. Eyes: Negative for visual changes. ENT: Negative for sore throat. Neck: No neck pain  Cardiovascular: Negative for chest pain. Respiratory: Negative for shortness of breath. Gastrointestinal: Negative for abdominal pain, vomiting or diarrhea. Genitourinary: Negative for dysuria. Musculoskeletal: + back pain. Skin: Negative for rash. + vaginal abscess  Neurological: Negative for headaches, weakness or numbness. Psych: No SI or HI  ____________________________________________   PHYSICAL EXAM:  VITAL SIGNS: ED Triage Vitals [11/10/16 1506]  Enc Vitals Group     BP 124/85     Pulse Rate 96     Resp 16     Temp 99.2 F (37.3 C)     Temp Source Oral     SpO2 99 %     Weight      Height      Head Circumference      Peak Flow      Pain Score 8     Pain Loc      Pain Edu?      Excl. in  [place]?     Constitutional: Alert and oriented. Well appearing and in no apparent distress. HEENT:      Head: Normocephalic and atraumatic.         Eyes: Conjunctivae are normal. Sclera is non-icteric.       Mouth/Throat: Mucous membranes are moist.       Neck: Supple with no signs of meningismus. Cardiovascular: Regular rate and rhythm. No murmurs, gallops, or rubs. 2+ symmetrical distal pulses are present in all extremities. No JVD. Respiratory: Normal respiratory effort. Lungs are clear to auscultation bilaterally. No wheezes, crackles, or rhonchi.  Gastrointestinal: Soft, non tender, and non distended with positive bowel sounds. No rebound or guarding. Genitourinary: No CVA tenderness. There is a 1cm abscess located in the R perineum region with minimal overlying erythema. Normal rectal tone and sensation. Musculoskeletal: Nontender with normal range of motion in all extremities. No edema, cyanosis, or erythema of extremities. Midline and paraspinal lumbar spine. Neurologic: Normal speech and language. Face is symmetric. Moving all extremities. No gross focal neurologic deficits are appreciated. 2+ DTRs on b/l patella and achilles, normal strength and sensation of b/l LE Skin: Skin is warm, dry and intact. No rash noted. Psychiatric: Mood and affect are normal. Speech and behavior are normal.  ____________________________________________   LABS (all labs ordered are listed, but only abnormal results are displayed)  Labs Reviewed  URINALYSIS, COMPLETE (UACMP) WITH MICROSCOPIC - Abnormal; Notable for the following:       Result Value   Color, Urine YELLOW (*)    APPearance CLEAR (*)    Hgb urine dipstick MODERATE (*)    Squamous Epithelial / LPF 0-5 (*)    All other components within normal limits  POC URINE PREG, ED  POCT PREGNANCY, URINE   ____________________________________________  EKG  none ____________________________________________  RADIOLOGY  XR lumbar:  Chronic  changes without acute abnormality ____________________________________________   PROCEDURES  Procedure(s) performed:yes .Marland KitchenIncision and Drainage Date/Time: 11/10/2016 5:29 PM Performed by: Rudene Re Authorized by: Rudene Re   Consent:    Consent obtained:  Verbal   Consent given by:  Patient   Risks discussed:  Bleeding, incomplete drainage, infection and pain Location:    Type:  Abscess   Size:  1.5   Location:  Anogenital   Anogenital location:  Perineum Pre-procedure details:    Skin preparation:  Betadine Anesthesia (see MAR for exact dosages):    Anesthesia method:  Topical application   Topical anesthetic:  EMLA cream Procedure type:    Complexity:  Simple Procedure details:    Incision types:  Stab incision   Scalpel blade:  11   Wound management:  Probed and deloculated   Drainage:  Purulent   Drainage amount:  Scant   Wound treatment:  Wound  left open   Packing materials:  None Post-procedure details:    Patient tolerance of procedure:  Tolerated well, no immediate complications   Critical Care performed:  None ____________________________________________   INITIAL IMPRESSION / ASSESSMENT AND PLAN / ED COURSE  46 y.o. female with a history of degenerative disc disease status postL4-5 fusion in 08/2015 who presents for evaluation of back pain and abscess in her vagina.   # back pain: Chronic pain for over a year. No signs or symptoms of cauda equina with normal DTR reflexes, normal rectal tone, normal postvoid residual, normal shrinks and sensation of bilateral lower extremities. Patient is being evaluated by orthopedics for this issue. We'll check urine to make sure she doesn't have a UTI which can be exacerbating her back pain.  # abscess; Drained per procedure note above will dc on bactrim.     Pertinent labs & imaging results that were available during my care of the patient were reviewed by me and considered in my medical decision  making (see chart for details).    ____________________________________________   FINAL CLINICAL IMPRESSION(S) / ED DIAGNOSES  Final diagnoses:  Abscess, perineum  Chronic midline low back pain with bilateral sciatica      NEW MEDICATIONS STARTED DURING THIS VISIT:  New Prescriptions   IBUPROFEN (ADVIL,MOTRIN) 600 MG TABLET    Take 1 tablet (600 mg total) by mouth every 6 (six) hours as needed.   METHOCARBAMOL (ROBAXIN) 500 MG TABLET    Take 1 tablet (500 mg total) by mouth 4 (four) times daily.   SULFAMETHOXAZOLE-TRIMETHOPRIM (BACTRIM DS,SEPTRA DS) 800-160 MG TABLET    Take 1 tablet by mouth 2 (two) times daily.     Note:  This document was prepared using Dragon voice recognition software and may include unintentional dictation errors.    Alfred Levins, [place], MD 11/10/16 [phone]

## 2016-11-10 NOTE — ED Triage Notes (Signed)
Pt to ED via POV. Pt ambulatory to triage states that she has had an abscess on her vagina x 2 weeks. Patient also c/o lower back pain. Pt states that she had surgery on her back in March, has been having pain since about 2 weeks post-op, pt states that her brace was tightened too tight and her back has been hurting since then. Pt also report increased falls recently and numbness in her feet. Pt in NAD in triage.

## 2016-11-10 NOTE — Discharge Instructions (Signed)
Follow up with your orthopedist for your back pain. Return to the ER if you have numbness of your groin area, weakness of your legs, urinary or bowel incontinence or retention.

## 2016-11-10 NOTE — ED Notes (Signed)
Post void residual 1 mL.

## 2017-02-19 ENCOUNTER — Emergency Department
Admission: EM | Admit: 2017-02-19 | Discharge: 2017-02-19 | Disposition: A | Discharge status: Home / Self Care | Payer: Self-pay | Attending: Emergency Medicine | Admitting: Emergency Medicine

## 2017-02-19 DIAGNOSIS — G8929 Other chronic pain: Secondary | ICD-10-CM

## 2017-02-19 DIAGNOSIS — H0266 Xanthelasma of left eye, unspecified eyelid: Secondary | ICD-10-CM | POA: Insufficient documentation

## 2017-02-19 DIAGNOSIS — M545 Low back pain: Secondary | ICD-10-CM

## 2017-02-19 DIAGNOSIS — F17210 Nicotine dependence, cigarettes, uncomplicated: Secondary | ICD-10-CM | POA: Insufficient documentation

## 2017-02-19 DIAGNOSIS — Z79899 Other long term (current) drug therapy: Secondary | ICD-10-CM | POA: Insufficient documentation

## 2017-02-19 DIAGNOSIS — E119 Type 2 diabetes mellitus without complications: Secondary | ICD-10-CM | POA: Insufficient documentation

## 2017-02-19 DIAGNOSIS — H0263 Xanthelasma of right eye, unspecified eyelid: Secondary | ICD-10-CM | POA: Insufficient documentation

## 2017-02-19 MED ORDER — OXYCODONE-ACETAMINOPHEN 5-325 MG PO TABS: 1.0000 | ORAL_TABLET | ORAL | 0 refills | Status: DC | PRN

## 2017-02-19 NOTE — ED Triage Notes (Signed)
Patient's initial complaint was right eye pain and swelling due to "calcium deposits." As the interview progressed she started to complain of low back pain with a "lump" over her surgical scar. Then states her legs were hurting and today when the pain got "so bad" she started sweating and "threw up". "Of course I didn't have anything for pain" so she came here to get checked. Right eye has calcium deposits noted above and below the eye. The right eye has slight swelling to the upper lid. Denies drainage from the eye.

## 2017-02-19 NOTE — ED Provider Notes (Signed)
[facility] Emergency Department Provider Note    First MD Initiated Contact with Patient 02/19/17 0522     (approximate)  I have reviewed the triage vital signs and the nursing notes.   HISTORY  Chief Complaint Eye Pain    HPI YESENNIA HIROTA is a 46 y.o. female with low list of medical conditions including chronic back pain presents to the emergency department with painful eyelid lesions which she states is secondary to calcium deposits times months.   Past Medical History:  Diagnosis Date  . Cancer (HCC)    + CANCER CELLS TO UTERUS  . Diabetes mellitus without complication ([place])   . Family history of adverse reaction to anesthesia    FATHER STOPS BREATHING WITH ANESTHESIA  . Headache    HX MIGRAINES  . Seizures ([place])    1 SEIZURE  VERY STRESSED    Patient Active Problem List   Diagnosis Date Noted  . Degenerative disc disease, lumbar 08/24/2015    Past Surgical History:  Procedure Laterality Date  . ABDOMINAL HYSTERECTOMY    . APPENDECTOMY    . BACK SURGERY      Prior to Admission medications   Medication Sig Start Date End Date Taking? Authorizing Provider  azithromycin (ZITHROMAX Z-PAK) 250 MG tablet 1 tab by mouth daily 06/08/16   Delman Kitten, MD  cyclobenzaprine (FLEXERIL) 10 MG tablet Take 1 tablet (10 mg total) by mouth 3 (three) times daily as needed. 04/27/16   [name], MD  ibuprofen (ADVIL,MOTRIN) 600 MG tablet Take 1 tablet (600 mg total) by mouth every 6 (six) hours as needed. 11/10/16   Rudene Re, MD  methocarbamol (ROBAXIN) 500 MG tablet Take 1 tablet (500 mg total) by mouth 4 (four) times daily. 11/10/16   Rudene Re, MD    Allergies Chlorhexidine; Demerol [meperidine]; and Morphine and related  Family History  Problem Relation Age of Onset  . Breast cancer Maternal Grandmother 68  . Breast cancer Paternal Grandfather 57    Social History Social History  Substance Use Topics  . Smoking  status: Current Every Day Smoker    Packs/day: 0.50    Years: 28.00    Types: Cigarettes  . Smokeless tobacco: Never Used  . Alcohol use No    Review of Systems Constitutional: No fever/chills Eyes: No visual changes. Positive for eyelid lesions ENT: No sore throat. Cardiovascular: Denies chest pain. Respiratory: Denies shortness of breath. Gastrointestinal: No abdominal pain.  No nausea, no vomiting.  No diarrhea.  No constipation. Genitourinary: Negative for dysuria. Musculoskeletal: Negative for neck pain.  Negative for back pain. Integumentary: Negative for rash. Neurological: Negative for headaches, focal weakness or numbness.   ____________________________________________   PHYSICAL EXAM:  VITAL SIGNS: ED Triage Vitals  Enc Vitals Group     BP 02/19/17 0147 (!) 170/94     Pulse Rate 02/19/17 0147 100     Resp 02/19/17 0147 18     Temp 02/19/17 0147 98.2 F (36.8 C)     Temp Source 02/19/17 0147 Oral     SpO2 02/19/17 0147 100 %     Weight 02/19/17 0149 74.8 kg (165 lb)     Height 02/19/17 0149 1.626 m (5\' 4" )     Head Circumference --      Peak Flow --      Pain Score 02/19/17 0146 7     Pain Loc --      Pain Edu? --  Excl. in [place]? --     Constitutional: Alert and oriented. Well appearing and in no acute distress. Eyes: Conjunctivae are normal. Xanthelasmas noted bilateral eyelids Head: Atraumatic. Mouth/Throat: Mucous membranes are moist. Oropharynx non-erythematous. Neck: No stridor.   Cardiovascular: Normal rate, regular rhythm. Good peripheral circulation. Grossly normal heart sounds. Respiratory: Normal respiratory effort.  No retractions. Lungs CTAB. Gastrointestinal: Soft and nontender. No distention.  Musculoskeletal: No lower extremity tenderness nor edema. No gross deformities of extremities. Neurologic:  Normal speech and language. No gross focal neurologic deficits are appreciated.  Skin:  Skin is warm, dry and intact. No rash  noted. Psychiatric: Mood and affect are normal. Speech and behavior are normal.    Procedures   ____________________________________________   INITIAL IMPRESSION / ASSESSMENT AND PLAN / ED COURSE  Pertinent labs & imaging results that were available during my care of the patient were reviewed by me and considered in my medical decision making (see chart for details).  Patient requesting referral to a neurosurgeon secondary to chronic back pain. Patient be referred to ophthalmology for her xanthelasmas bilaterally.      ____________________________________________  FINAL CLINICAL IMPRESSION(S) / ED DIAGNOSES  Final diagnoses:  Xanthelasma of eyelid, bilateral  Chronic midline low back pain without sciatica     MEDICATIONS GIVEN DURING THIS VISIT:  Medications - No data to display   NEW OUTPATIENT MEDICATIONS STARTED DURING THIS VISIT:  New Prescriptions   No medications on file    Modified Medications   No medications on file    Discontinued Medications   No medications on file     Note:  This document was prepared using Dragon voice recognition software and may include unintentional dictation errors.    [name], MD 02/19/17 0600

## 2017-06-06 ENCOUNTER — Emergency Department
Admission: EM | Admit: 2017-06-06 | Discharge: 2017-06-06 | Disposition: A | Discharge status: Home / Self Care | Payer: Self-pay | Attending: Emergency Medicine | Admitting: Emergency Medicine

## 2017-06-06 ENCOUNTER — Other Ambulatory Visit: Payer: Self-pay

## 2017-06-06 ENCOUNTER — Encounter: Payer: Self-pay | Admitting: Emergency Medicine

## 2017-06-06 DIAGNOSIS — G8929 Other chronic pain: Secondary | ICD-10-CM

## 2017-06-06 DIAGNOSIS — M5441 Lumbago with sciatica, right side: Secondary | ICD-10-CM | POA: Insufficient documentation

## 2017-06-06 DIAGNOSIS — E119 Type 2 diabetes mellitus without complications: Secondary | ICD-10-CM | POA: Insufficient documentation

## 2017-06-06 DIAGNOSIS — F17210 Nicotine dependence, cigarettes, uncomplicated: Secondary | ICD-10-CM | POA: Insufficient documentation

## 2017-06-06 MED ORDER — LIDOCAINE 5 % EX PTCH: 1.0000 | MEDICATED_PATCH | CUTANEOUS | 0 refills | Status: DC

## 2017-06-06 MED ORDER — KETOROLAC TROMETHAMINE 60 MG/2ML IM SOLN
60.0000 mg | Freq: Once | INTRAMUSCULAR | Status: AC
  Administered 2017-06-06: 60 mg via INTRAMUSCULAR
  Filled 2017-06-06: qty 2

## 2017-06-06 MED ORDER — CYCLOBENZAPRINE HCL 10 MG PO TABS: 10.0000 mg | ORAL_TABLET | Freq: Three times a day (TID) | ORAL | 0 refills | Status: DC | PRN

## 2017-06-06 MED ORDER — CYCLOBENZAPRINE HCL 10 MG PO TABS
10.0000 mg | ORAL_TABLET | Freq: Once | ORAL | Status: AC
  Administered 2017-06-06: 10 mg via ORAL
  Filled 2017-06-06: qty 1

## 2017-06-06 MED ORDER — LIDOCAINE 5 % EX PTCH
1.0000 | MEDICATED_PATCH | CUTANEOUS | Status: DC
  Administered 2017-06-06: 1 via TRANSDERMAL
  Filled 2017-06-06: qty 1

## 2017-06-06 NOTE — ED Provider Notes (Signed)
[facility] Emergency Department Provider Note    First MD Initiated Contact with Patient 06/06/17 [phone]     (approximate)  I have reviewed the triage vital signs and the nursing notes.   HISTORY  Chief Complaint Back Pain    HPI [name] is a 45 y.o. female with history of chronic back pain status post L4-5 fusion March 2017 presents with worsening low back pain x 3 days. Patient denies any urinary or bowel habit changes or numbness.  Patient states her current pain score is 10 out of 10 and worse with any movement.   Past Medical History:  Diagnosis Date  . Cancer (HCC)    + CANCER CELLS TO UTERUS  . Diabetes mellitus without complication ([place])   . Family history of adverse reaction to anesthesia    FATHER STOPS BREATHING WITH ANESTHESIA  . Headache    HX MIGRAINES  . Seizures ([place])    1 SEIZURE  VERY STRESSED    Patient Active Problem List   Diagnosis Date Noted  . Degenerative disc disease, lumbar 08/24/2015    Past Surgical History:  Procedure Laterality Date  . ABDOMINAL HYSTERECTOMY    . APPENDECTOMY    . BACK SURGERY      Prior to Admission medications   Medication Sig Start Date End Date Taking? Authorizing Provider  azithromycin (ZITHROMAX Z-PAK) 250 MG tablet 1 tab by mouth daily 06/08/16   Delman Kitten, MD  cyclobenzaprine (FLEXERIL) 10 MG tablet Take 1 tablet (10 mg total) by mouth 3 (three) times daily as needed. 04/27/16   [name], MD  cyclobenzaprine (FLEXERIL) 10 MG tablet Take 1 tablet (10 mg total) by mouth 3 (three) times daily as needed. 06/06/17   [name], MD  ibuprofen (ADVIL,MOTRIN) 600 MG tablet Take 1 tablet (600 mg total) by mouth every 6 (six) hours as needed. 11/10/16   Alfred Levins, [place], MD  lidocaine (LIDODERM) 5 % Place 1 patch onto the skin daily. Remove & Discard patch within 12 hours or as directed by MD 06/06/17   [name], MD  methocarbamol (ROBAXIN) 500 MG tablet Take  1 tablet (500 mg total) by mouth 4 (four) times daily. 11/10/16   Rudene Re, MD  oxyCODONE-acetaminophen (ROXICET) 5-325 MG tablet Take 1 tablet by mouth every 4 (four) hours as needed for severe pain. 02/19/17   [name], MD    Allergies Chlorhexidine; Demerol [meperidine]; and Morphine and related  Family History  Problem Relation Age of Onset  . Breast cancer Maternal Grandmother 53  . Breast cancer Paternal Grandfather 73    Social History Social History   Tobacco Use  . Smoking status: Current Every Day Smoker    Packs/day: 0.50    Years: 28.00    Pack years: 14.00    Types: Cigarettes  . Smokeless tobacco: Never Used  Substance Use Topics  . Alcohol use: No    Alcohol/week: 0.0 oz  . Drug use: No    Review of Systems Constitutional: No fever/chills Eyes: No visual changes. ENT: No sore throat. Cardiovascular: Denies chest pain. Respiratory: Denies shortness of breath. Gastrointestinal: No abdominal pain.  No nausea, no vomiting.  No diarrhea.  No constipation. Genitourinary: Negative for dysuria. Musculoskeletal: Negative for neck pain.  Positive for back pain. Integumentary: Negative for rash. Neurological: Negative for headaches, focal weakness or numbness.   ____________________________________________   PHYSICAL EXAM:  VITAL SIGNS: ED Triage Vitals  Enc Vitals Group  BP 06/06/17 0524 (!) 145/96     Pulse Rate 06/06/17 0524 90     Resp 06/06/17 0524 17     Temp 06/06/17 0524 (!) 97.5 F (36.4 C)     Temp Source 06/06/17 0524 Oral     SpO2 06/06/17 0524 100 %     Weight 06/06/17 0522 74.8 kg (165 lb)     Height 06/06/17 0522 1.626 m (5\' 4" )     Head Circumference --      Peak Flow --      Pain Score 06/06/17 0522 10     Pain Loc --      Pain Edu? --      Excl. in [place]? --     Constitutional: Alert and oriented. Well appearing and in no acute distress. Eyes: Conjunctivae are normal.  Head: Atraumatic. Mouth/Throat: Mucous  membranes are moist.  Oropharynx non-erythematous. Neck: No stridor. Cardiovascular: Normal rate, regular rhythm. Good peripheral circulation. Grossly normal heart sounds. Respiratory: Normal respiratory effort.  No retractions. Lungs CTAB. Gastrointestinal: Soft and nontender. No distention.  Musculoskeletal: No lower extremity tenderness nor edema. No gross deformities of extremities. Neurologic:  Normal speech and language. No gross focal neurologic deficits are appreciated.  Skin:  Skin is warm, dry and intact. No rash noted. Psychiatric: Mood and affect are normal. Speech and behavior are normal.    Procedures   ____________________________________________   INITIAL IMPRESSION / ASSESSMENT AND PLAN / ED COURSE  As part of my medical decision making, I reviewed the following data within the electronic MEDICAL RECORD [id number] year old female present with above-stated history physical exam consistent with chronic low back pain.  Patient given 60 mg Toradol IM and Lidoderm patch applied.  ____________________________________________  FINAL CLINICAL IMPRESSION(S) / ED DIAGNOSES  Final diagnoses:  Chronic bilateral low back pain with right-sided sciatica     MEDICATIONS GIVEN DURING THIS VISIT:  Medications  lidocaine (LIDODERM) 5 % 1 patch (1 patch Transdermal Patch Applied 06/06/17 0603)  ketorolac (TORADOL) injection 60 mg (60 mg Intramuscular Given 06/06/17 0603)  cyclobenzaprine (FLEXERIL) tablet 10 mg (10 mg Oral Given 06/06/17 0603)     ED Discharge Orders        Ordered    cyclobenzaprine (FLEXERIL) 10 MG tablet  3 times daily PRN     06/06/17 0609    lidocaine (LIDODERM) 5 %  Every 24 hours     06/06/17 0609       Note:  This document was prepared using Dragon voice recognition software and may include unintentional dictation errors.    [name], MD 06/06/17 [phone]

## 2017-06-06 NOTE — ED Triage Notes (Signed)
Patient ambulatory to triage with steady gait, without difficulty or distress noted; pt reports lower back pain, hx of same

## 2017-06-06 NOTE — ED Notes (Signed)

## 2017-09-15 ENCOUNTER — Encounter: Payer: Self-pay | Admitting: Emergency Medicine

## 2017-09-15 ENCOUNTER — Emergency Department
Admission: EM | Admit: 2017-09-15 | Discharge: 2017-09-15 | Disposition: A | Discharge status: Home / Self Care | Payer: Self-pay | Attending: Emergency Medicine | Admitting: Emergency Medicine

## 2017-09-15 DIAGNOSIS — K0889 Other specified disorders of teeth and supporting structures: Secondary | ICD-10-CM | POA: Insufficient documentation

## 2017-09-15 DIAGNOSIS — F17210 Nicotine dependence, cigarettes, uncomplicated: Secondary | ICD-10-CM | POA: Insufficient documentation

## 2017-09-15 DIAGNOSIS — Z8542 Personal history of malignant neoplasm of other parts of uterus: Secondary | ICD-10-CM | POA: Insufficient documentation

## 2017-09-15 DIAGNOSIS — Z79899 Other long term (current) drug therapy: Secondary | ICD-10-CM | POA: Insufficient documentation

## 2017-09-15 DIAGNOSIS — R1110 Vomiting, unspecified: Secondary | ICD-10-CM | POA: Insufficient documentation

## 2017-09-15 MED ORDER — TRAMADOL HCL 50 MG PO TABS: 50.0000 mg | ORAL_TABLET | Freq: Four times a day (QID) | ORAL | 0 refills | Status: AC | PRN

## 2017-09-15 MED ORDER — ONDANSETRON HCL 8 MG PO TABS: 8.0000 mg | ORAL_TABLET | Freq: Three times a day (TID) | ORAL | 0 refills | Status: DC | PRN

## 2017-09-15 MED ORDER — ONDANSETRON 8 MG PO TBDP
8.0000 mg | ORAL_TABLET | Freq: Once | ORAL | Status: AC
  Administered 2017-09-15: 8 mg via ORAL
  Filled 2017-09-15: qty 1

## 2017-09-15 NOTE — Discharge Instructions (Signed)
Advised to follow-up with treating dentist to discuss care post extraction.

## 2017-09-15 NOTE — ED Triage Notes (Signed)
Pt here with c/o vomiting since last Monday when her tooth was pulled and states she can't keep food down.  Pt drinking [place] dew in triage. Appears in no distress, denies fevers.

## 2017-09-15 NOTE — ED Notes (Signed)
See triage note  States she had tooth extracted last Monday  Developed some n/v since  States she is able to keep po fluids down but not any food . Afebrile on arrival

## 2017-09-15 NOTE — ED Provider Notes (Signed)
[facility] Emergency Department Provider Note   ____________________________________________   First MD Initiated Contact with Patient 09/15/17 1137     (approximate)  I have reviewed the triage vital signs and the nursing notes.   HISTORY  Chief Complaint Dental Pain and Emesis    HPI [name] is a 47 y.o. female patient complained of vomiting for 1 week.  Onset of complaint after the tooth extraction 1 week ago.  Patient state can tolerate fluids but had trouble keeping food down secondary to nausea.  Patient denies diarrhea.  Patient denies fever.  Patient also complained of dental pain at the extraction site.  Patient has not contacted dentist secondary to complaint.  Patient rates pain as a 5/10.  Palliative measures for complaint.   Past Medical History:  Diagnosis Date  . Cancer (HCC)    + CANCER CELLS TO UTERUS  . Diabetes mellitus without complication ([place])   . Family history of adverse reaction to anesthesia    FATHER STOPS BREATHING WITH ANESTHESIA  . Headache    HX MIGRAINES  . Seizures ([place])    1 SEIZURE  VERY STRESSED    Patient Active Problem List   Diagnosis Date Noted  . Degenerative disc disease, lumbar 08/24/2015    Past Surgical History:  Procedure Laterality Date  . ABDOMINAL HYSTERECTOMY    . APPENDECTOMY    . BACK SURGERY      Prior to Admission medications   Medication Sig Start Date End Date Taking? Authorizing Provider  azithromycin (ZITHROMAX Z-PAK) 250 MG tablet 1 tab by mouth daily 06/08/16   Delman Kitten, MD  cyclobenzaprine (FLEXERIL) 10 MG tablet Take 1 tablet (10 mg total) by mouth 3 (three) times daily as needed. 04/27/16   [name], MD  cyclobenzaprine (FLEXERIL) 10 MG tablet Take 1 tablet (10 mg total) by mouth 3 (three) times daily as needed. 06/06/17   [name], MD  ibuprofen (ADVIL,MOTRIN) 600 MG tablet Take 1 tablet (600 mg total) by mouth every 6 (six) hours as needed. 11/10/16    Alfred Levins, [place], MD  lidocaine (LIDODERM) 5 % Place 1 patch onto the skin daily. Remove & Discard patch within 12 hours or as directed by MD 06/06/17   [name], MD  methocarbamol (ROBAXIN) 500 MG tablet Take 1 tablet (500 mg total) by mouth 4 (four) times daily. 11/10/16   Rudene Re, MD  ondansetron (ZOFRAN) 8 MG tablet Take 1 tablet (8 mg total) by mouth every 8 (eight) hours as needed for nausea or vomiting. 09/15/17   Sable Feil, PA-C  oxyCODONE-acetaminophen (ROXICET) 5-325 MG tablet Take 1 tablet by mouth every 4 (four) hours as needed for severe pain. 02/19/17   [name], MD  traMADol (ULTRAM) 50 MG tablet Take 1 tablet (50 mg total) by mouth every 6 (six) hours as needed. 09/15/17 09/15/18  Sable Feil, PA-C    Allergies Chlorhexidine; Demerol [meperidine]; and Morphine and related  Family History  Problem Relation Age of Onset  . Breast cancer Maternal Grandmother 54  . Breast cancer Paternal Grandfather 14    Social History Social History   Tobacco Use  . Smoking status: Current Every Day Smoker    Packs/day: 0.50    Years: 28.00    Pack years: 14.00    Types: Cigarettes  . Smokeless tobacco: Never Used  Substance Use Topics  . Alcohol use: No    Alcohol/week: 0.0 oz  . Drug  use: No    Review of Systems  Constitutional: No fever/chills Eyes: No visual changes. ENT: No sore throat.  Dental pain. Cardiovascular: Denies chest pain. Respiratory: Denies shortness of breath. Gastrointestinal: No abdominal pain.  Nausea and vomiting.  No diarrhea.  No constipation. Genitourinary: Negative for dysuria. Musculoskeletal: Negative for back pain. Skin: Negative for rash. Neurological: Negative for headaches, focal weakness or numbness. Allergic/Immunilogical: Demerol and morphine  ____________________________________________   PHYSICAL EXAM:  VITAL SIGNS: ED Triage Vitals  Enc Vitals Group     BP 09/15/17 1125 (!) 126/51      Pulse Rate 09/15/17 1125 92     Resp 09/15/17 1125 18     Temp 09/15/17 1125 98.4 F (36.9 C)     Temp Source 09/15/17 1125 Oral     SpO2 09/15/17 1125 99 %     Weight --      Height --      Head Circumference --      Peak Flow --      Pain Score 09/15/17 1126 3     Pain Loc --      Pain Edu? --      Excl. in [place]? --     Constitutional: Alert and oriented. Well appearing and in no acute distress. Mouth/Throat: Mucous membranes are moist.  Oropharynx non-erythematous.  Tooth extraction #4 Neck: No stridor.  Hematological/Lymphatic/Immunilogical: No cervical lymphadenopathy. Cardiovascular: Normal rate, regular rhythm. Grossly normal heart sounds.  Good peripheral circulation. Respiratory: Normal respiratory effort.  No retractions. Lungs CTAB. Gastrointestinal: Soft and nontender. No distention. No abdominal bruits. No CVA tenderness. Musculoskeletal: No lower extremity tenderness nor edema.  No joint effusions. Neurologic:  Normal speech and language. No gross focal neurologic deficits are appreciated. No gait instability. Skin:  Skin is warm, dry and intact. No rash noted. Psychiatric: Mood and affect are normal. Speech and behavior are normal.  ____________________________________________   LABS (all labs ordered are listed, but only abnormal results are displayed)  Labs Reviewed - No data to display ____________________________________________  EKG   ____________________________________________  RADIOLOGY  ED MD interpretation:    Official radiology report(s): No results found.  ____________________________________________   PROCEDURES  Procedure(s) performed: None  Procedures  Critical Care performed: No  ____________________________________________   INITIAL IMPRESSION / ASSESSMENT AND PLAN / ED COURSE  As part of my medical decision making, I reviewed the following data within the electronic MEDICAL RECORD NUMBER    Nausea and vomiting dental pain.   Patient given discharge care instructions.  Patient advised to follow treating dentist for continued care.  Patient given a prescription for Zofran and tramadol.      ____________________________________________   FINAL CLINICAL IMPRESSION(S) / ED DIAGNOSES  Final diagnoses:  Pain, dental  Vomiting, intractability of vomiting not specified, presence of nausea not specified, unspecified vomiting type     ED Discharge Orders        Ordered    ondansetron (ZOFRAN) 8 MG tablet  Every 8 hours PRN     09/15/17 1146    traMADol (ULTRAM) 50 MG tablet  Every 6 hours PRN     09/15/17 1146       Note:  This document was prepared using Dragon voice recognition software and may include unintentional dictation errors.    Sable Feil, PA-C 09/15/17 [place], [place], MD 09/15/17 1539

## 2018-02-16 ENCOUNTER — Emergency Department: Payer: Self-pay

## 2018-02-16 ENCOUNTER — Emergency Department
Admission: EM | Admit: 2018-02-16 | Discharge: 2018-02-16 | Disposition: A | Discharge status: Home / Self Care | Payer: Self-pay | Attending: Emergency Medicine | Admitting: Emergency Medicine

## 2018-02-16 ENCOUNTER — Encounter: Payer: Self-pay | Admitting: Emergency Medicine

## 2018-02-16 DIAGNOSIS — M25531 Pain in right wrist: Secondary | ICD-10-CM | POA: Insufficient documentation

## 2018-02-16 DIAGNOSIS — Z79899 Other long term (current) drug therapy: Secondary | ICD-10-CM | POA: Insufficient documentation

## 2018-02-16 DIAGNOSIS — F17210 Nicotine dependence, cigarettes, uncomplicated: Secondary | ICD-10-CM | POA: Insufficient documentation

## 2018-02-16 NOTE — ED Provider Notes (Signed)
[facility] Emergency Department Provider Note  ____________________________________________   First MD Initiated Contact with Patient 02/16/18 [phone]     (approximate)  I have reviewed the triage vital signs and the nursing notes.   HISTORY  Chief Complaint Wrist Pain    HPI RAMANDEEP ARINGTON is a 47 y.o. female who presents for evaluation of pain in her right wrist that is been present for 3 weeks.  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  She put up her wrist to block it to avoid any other injury but believes that she sprained her wrist.  However she is concerned because the pain has been going on for 3 weeks.  She says it is just as bad now as it was when it first happened even though she has been wearing a removable wrist splint at home every day as much as possible.  She denies numbness and tingling.  She says the pain is reproducible with flexion extension of her wrist and that she had no pain or tenderness prior to the injury.  She says that initially there was severe swelling but that has completely resolved.  There was bruising as well but that also has resolved.  She has no other injuries or complaints at this time.  She says the pain feels both aching and sharp.  No weakness although using the wrist and hand reproduces the pain.  Past Medical History:  Diagnosis Date  . Cancer (HCC)    + CANCER CELLS TO UTERUS  . Diabetes mellitus without complication ([place])   . Family history of adverse reaction to anesthesia    FATHER STOPS BREATHING WITH ANESTHESIA  . Headache    HX MIGRAINES  . Seizures ([place])    1 SEIZURE  VERY STRESSED    Patient Active Problem List   Diagnosis Date Noted  . Degenerative disc disease, lumbar 08/24/2015    Past Surgical History:  Procedure Laterality Date  . ABDOMINAL HYSTERECTOMY    . APPENDECTOMY    . BACK SURGERY      Prior to Admission  medications   Medication Sig Start Date End Date Taking? Authorizing Provider  azithromycin (ZITHROMAX Z-PAK) 250 MG tablet 1 tab by mouth daily 06/08/16   Delman Kitten, MD  cyclobenzaprine (FLEXERIL) 10 MG tablet Take 1 tablet (10 mg total) by mouth 3 (three) times daily as needed. 04/27/16   [name], MD  cyclobenzaprine (FLEXERIL) 10 MG tablet Take 1 tablet (10 mg total) by mouth 3 (three) times daily as needed. 06/06/17   [name], MD  ibuprofen (ADVIL,MOTRIN) 600 MG tablet Take 1 tablet (600 mg total) by mouth every 6 (six) hours as needed. 11/10/16   Alfred Levins, [place], MD  lidocaine (LIDODERM) 5 % Place 1 patch onto the skin daily. Remove & Discard patch within 12 hours or as directed by MD 06/06/17   [name], MD  methocarbamol (ROBAXIN) 500 MG tablet Take 1 tablet (500 mg total) by mouth 4 (four) times daily. 11/10/16   Rudene Re, MD  ondansetron (ZOFRAN) 8 MG tablet Take 1 tablet (8 mg total) by mouth every 8 (eight) hours as needed for nausea or vomiting. 09/15/17   Sable Feil, PA-C  oxyCODONE-acetaminophen (ROXICET) 5-325 MG tablet Take 1 tablet by mouth every 4 (four) hours as needed for severe pain. 02/19/17   [name], MD  traMADol Veatrice Bourbon)  50 MG tablet Take 1 tablet (50 mg total) by mouth every 6 (six) hours as needed. 09/15/17 09/15/18  Sable Feil, PA-C    Allergies Chlorhexidine; Demerol [meperidine]; and Morphine and related  Family History  Problem Relation Age of Onset  . Breast cancer Maternal Grandmother 29  . Breast cancer Paternal Grandfather 66    Social History Social History   Tobacco Use  . Smoking status: Current Every Day Smoker    Packs/day: 0.50    Years: 28.00    Pack years: 14.00    Types: Cigarettes  . Smokeless tobacco: Never Used  Substance Use Topics  . Alcohol use: No    Alcohol/week: 0.0 standard drinks  . Drug use: No    Review of Systems Constitutional: No fever/chills Cardiovascular:  Denies chest pain. Respiratory: Denies shortness of breath. Gastrointestinal: No abdominal pain.  No nausea, no vomiting.   Musculoskeletal: Pain in right wrist for 3 weeks.  Negative for neck pain.  Negative for back pain. Neurological: Negative for headaches, focal weakness or numbness.   ____________________________________________   PHYSICAL EXAM:  VITAL SIGNS: ED Triage Vitals  Enc Vitals Group     BP 02/16/18 0252 (!) 153/95     Pulse Rate 02/16/18 0252 71     Resp 02/16/18 0252 18     Temp 02/16/18 0252 98.6 F (37 C)     Temp Source 02/16/18 0252 Oral     SpO2 02/16/18 0252 100 %     Weight 02/16/18 0254 74.8 kg (165 lb)     Height 02/16/18 0254 1.626 m (5\' 4" )     Head Circumference --      Peak Flow --      Pain Score 02/16/18 0253 9     Pain Loc --      Pain Edu? --      Excl. in [place]? --     Constitutional: Alert and oriented. Well appearing and in no acute distress. Eyes: Conjunctivae are normal.  Head: Atraumatic. Cardiovascular: Normal rate, regular rhythm. Good peripheral circulation.  Respiratory: Normal respiratory effort.  No retractions.  Musculoskeletal: Normal appearing right forearm, wrist, and hand.  No snuffbox tenderness.  Patient reports tenderness to palpation throughout the wrist and with flexion extension but with no evidence of effusion or ecchymosis. Neurologic:  Normal speech and language. No gross focal neurologic deficits are appreciated.  Skin:  Skin is warm, dry and intact. No rash noted. Psychiatric: Mood and affect are normal. Speech and behavior are normal.  ____________________________________________   LABS (all labs ordered are listed, but only abnormal results are displayed)  Labs Reviewed - No data to display ____________________________________________  EKG  None - EKG not ordered by ED physician ____________________________________________  RADIOLOGY Ursula Alert, personally viewed and evaluated these images  (plain radiographs) as part of my medical decision making, as well as reviewing the written report by the radiologist.    ED MD interpretation: No fracture or deformity  Official radiology report(s): Dg Wrist Complete Right  Result Date: 02/16/2018 CLINICAL DATA:  47 y/o F; blunt trauma to the wrist 3 weeks ago with persistent pain. EXAM: RIGHT WRIST - COMPLETE 3+ VIEW COMPARISON:  None. FINDINGS: There is no evidence of fracture or dislocation. There is no evidence of arthropathy or other focal bone abnormality. Soft tissues are unremarkable. IMPRESSION: Negative. Electronically Signed   By: Kristine Garbe M.D.   On: 02/16/2018 03:56    ____________________________________________   PROCEDURES  Critical Care performed:  No   Procedure(s) performed:   Procedures   ____________________________________________   INITIAL IMPRESSION / ASSESSMENT AND PLAN / ED COURSE  As part of my medical decision making, I reviewed the following data within the [facility] notes reviewed and incorporated and Notes from prior ED visits and radiograph reviewed   No evidence of fracture or dislocation on radiograph.  The patient has no external sign of injury or trauma.  I explained to her that if the bone had been injured 3 weeks ago we should see some Simon on the x-ray today.  I encouraged continued use of wrist splints but explained that I felt an Ortho-Glass splint would be overkill for the current presentation and would limit her range of motion and use of her hand and arm.  I encouraged follow-up as an outpatient with orthopedics for additional management recommendations and the use of over-the-counter ibuprofen and Tylenol as needed.  She states that she understands and agrees with the plan.     ____________________________________________  FINAL CLINICAL IMPRESSION(S) / ED DIAGNOSES  Final diagnoses:  Right wrist pain     MEDICATIONS GIVEN DURING THIS  VISIT:  Medications - No data to display   ED Discharge Orders    None       Note:  This document was prepared using Dragon voice recognition software and may include unintentional dictation errors.    Hinda Kehr, MD 02/16/18 [phone]

## 2018-02-16 NOTE — Discharge Instructions (Addendum)
Most likely you are still suffering from pain from a bad wrist sprain.  Please use your splint whenever possible.  Follow up with orthopedics.  Use over-the-counter pain medication as needed.

## 2018-02-16 NOTE — ED Triage Notes (Signed)
Patient states that a metal bar fell on her wrist 3 weeks ago and that she continues to have pain. Patient states that she has tried wearing a brace with no improvement.

## 2018-02-16 NOTE — ED Notes (Signed)
Pt c/o pain in RT wrist after a metal bar fell on it 3 weeks ago, pt concerned d/t the pain continuing, pt reports purchasing a "short" splint from [place] and it not helping  Wrist appears same as LT wrist, no appreciable swelling or numbness, CMS intact equally to extremities but pt reports pain on making fist, using OTC IBU for pain control

## 2018-02-16 NOTE — ED Notes (Signed)
No peripheral IV placed this visit.   Discharge instructions reviewed with patient. Questions fielded by this RN. Patient verbalizes understanding of instructions. Patient discharged home in stable condition per [place]bach. No acute distress noted at time of discharge.   

## 2018-05-09 ENCOUNTER — Emergency Department
Admission: EM | Admit: 2018-05-09 | Discharge: 2018-05-09 | Disposition: A | Discharge status: Home / Self Care | Payer: Self-pay | Attending: Emergency Medicine | Admitting: Emergency Medicine

## 2018-05-09 ENCOUNTER — Other Ambulatory Visit: Payer: Self-pay

## 2018-05-09 ENCOUNTER — Emergency Department: Payer: Self-pay

## 2018-05-09 DIAGNOSIS — E119 Type 2 diabetes mellitus without complications: Secondary | ICD-10-CM | POA: Insufficient documentation

## 2018-05-09 DIAGNOSIS — Y999 Unspecified external cause status: Secondary | ICD-10-CM | POA: Insufficient documentation

## 2018-05-09 DIAGNOSIS — Y929 Unspecified place or not applicable: Secondary | ICD-10-CM | POA: Insufficient documentation

## 2018-05-09 DIAGNOSIS — Y9389 Activity, other specified: Secondary | ICD-10-CM | POA: Insufficient documentation

## 2018-05-09 DIAGNOSIS — S62645A Nondisplaced fracture of proximal phalanx of left ring finger, initial encounter for closed fracture: Secondary | ICD-10-CM | POA: Insufficient documentation

## 2018-05-09 DIAGNOSIS — F17210 Nicotine dependence, cigarettes, uncomplicated: Secondary | ICD-10-CM | POA: Insufficient documentation

## 2018-05-09 DIAGNOSIS — Z8541 Personal history of malignant neoplasm of cervix uteri: Secondary | ICD-10-CM | POA: Insufficient documentation

## 2018-05-09 DIAGNOSIS — Z79899 Other long term (current) drug therapy: Secondary | ICD-10-CM | POA: Insufficient documentation

## 2018-05-09 MED ORDER — OXYCODONE-ACETAMINOPHEN 7.5-325 MG PO TABS: 1.0000 | ORAL_TABLET | Freq: Four times a day (QID) | ORAL | 0 refills | Status: DC | PRN
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
## 2018-05-09 NOTE — Discharge Instructions (Signed)
Wear splint today for evaluation by orthopedics.

## 2018-11-26 ENCOUNTER — Encounter: Payer: Self-pay | Admitting: Intensive Care

## 2018-11-26 ENCOUNTER — Other Ambulatory Visit: Payer: Self-pay

## 2018-11-26 ENCOUNTER — Emergency Department
Admission: EM | Admit: 2018-11-26 | Discharge: 2018-11-26 | Disposition: A | Discharge status: Home / Self Care | Payer: Self-pay | Attending: Emergency Medicine | Admitting: Emergency Medicine

## 2018-11-26 DIAGNOSIS — F17210 Nicotine dependence, cigarettes, uncomplicated: Secondary | ICD-10-CM | POA: Insufficient documentation

## 2018-11-26 DIAGNOSIS — N764 Abscess of vulva: Secondary | ICD-10-CM | POA: Insufficient documentation

## 2018-11-26 DIAGNOSIS — E119 Type 2 diabetes mellitus without complications: Secondary | ICD-10-CM | POA: Insufficient documentation

## 2018-11-26 HISTORY — DX: Dorsalgia, unspecified: M54.9

## 2018-11-26 MED ORDER — DOXYCYCLINE HYCLATE 100 MG PO CAPS: 100.0000 mg | ORAL_CAPSULE | Freq: Two times a day (BID) | ORAL | 0 refills | Status: AC

## 2018-11-26 MED ORDER — CEPHALEXIN 500 MG PO CAPS: 500.0000 mg | ORAL_CAPSULE | Freq: Three times a day (TID) | ORAL | 0 refills | Status: AC

## 2018-11-26 MED ORDER — TRAMADOL HCL 50 MG PO TABS: 50.0000 mg | ORAL_TABLET | Freq: Four times a day (QID) | ORAL | 0 refills | Status: AC | PRN

## 2018-11-26 MED ORDER — ONDANSETRON 4 MG PO TBDP
4.0000 mg | ORAL_TABLET | Freq: Once | ORAL | Status: AC
  Administered 2018-11-26: 4 mg via ORAL
  Filled 2018-11-26: qty 1

## 2018-11-26 MED ORDER — OXYCODONE-ACETAMINOPHEN 5-325 MG PO TABS
1.0000 | ORAL_TABLET | Freq: Once | ORAL | Status: AC
  Administered 2018-11-26: 1 via ORAL
  Filled 2018-11-26: qty 1

## 2018-11-26 MED ORDER — LIDOCAINE HCL 1 % IJ SOLN
5.0000 mL | Freq: Once | INTRAMUSCULAR | Status: AC
  Administered 2018-11-26: 5 mL
  Filled 2018-11-26: qty 10

## 2018-11-26 MED ORDER — LIDOCAINE-EPINEPHRINE-TETRACAINE (LET) SOLUTION
3.0000 mL | Freq: Once | NASAL | Status: AC
  Administered 2018-11-26: 3 mL via TOPICAL
  Filled 2018-11-26: qty 3

## 2018-11-26 NOTE — ED Provider Notes (Signed)
[facility] Emergency Department Provider Note  ____________________________________________  Time seen: Approximately 5:10 PM  I have reviewed the triage vital signs and the nursing notes.   HISTORY  Chief Complaint Recurrent Skin Infections    HPI [name] is a 48 y.o. female presents to the emergency department with an abscess of the left labia majora that has been present for the past week. She denies fever and chills. She has tried hot showers at home with no relief.  She describes similar symptoms in the past.  Patient states that she has a history of diabetes and has been noncompliant with her medications.  No other alleviating measures have been attempted.        Past Medical History:  Diagnosis Date  . Back pain   . Cancer (HCC)    + CANCER CELLS TO UTERUS  . Diabetes mellitus without complication ([place])   . Family history of adverse reaction to anesthesia    FATHER STOPS BREATHING WITH ANESTHESIA  . Headache    HX MIGRAINES  . Seizures ([place])    1 SEIZURE  VERY STRESSED    Patient Active Problem List   Diagnosis Date Noted  . Degenerative disc disease, lumbar 08/24/2015    Past Surgical History:  Procedure Laterality Date  . ABDOMINAL HYSTERECTOMY    . APPENDECTOMY    . BACK SURGERY      Prior to Admission medications   Medication Sig Start Date End Date Taking? Authorizing Provider  azithromycin (ZITHROMAX Z-PAK) 250 MG tablet 1 tab by mouth daily 06/08/16   Delman Kitten, MD  cephALEXin (KEFLEX) 500 MG capsule Take 1 capsule (500 mg total) by mouth 3 (three) times daily for 7 days. 11/26/18 12/03/18  Lannie Fields, PA-C  cyclobenzaprine (FLEXERIL) 10 MG tablet Take 1 tablet (10 mg total) by mouth 3 (three) times daily as needed. 04/27/16   [name], MD  cyclobenzaprine (FLEXERIL) 10 MG tablet Take 1 tablet (10 mg total) by mouth 3 (three) times daily as needed. 06/06/17   [name], MD  doxycycline  (VIBRAMYCIN) 100 MG capsule Take 1 capsule (100 mg total) by mouth 2 (two) times daily for 7 days. 11/26/18 12/03/18  Lannie Fields, PA-C  ibuprofen (ADVIL,MOTRIN) 600 MG tablet Take 1 tablet (600 mg total) by mouth every 6 (six) hours as needed. 11/10/16   Alfred Levins, [place], MD  lidocaine (LIDODERM) 5 % Place 1 patch onto the skin daily. Remove & Discard patch within 12 hours or as directed by MD 06/06/17   [name], MD  methocarbamol (ROBAXIN) 500 MG tablet Take 1 tablet (500 mg total) by mouth 4 (four) times daily. 11/10/16   Rudene Re, MD  ondansetron (ZOFRAN) 8 MG tablet Take 1 tablet (8 mg total) by mouth every 8 (eight) hours as needed for nausea or vomiting. 09/15/17   Sable Feil, PA-C  oxyCODONE-acetaminophen (PERCOCET) 7.5-325 MG tablet Take 1 tablet by mouth every 6 (six) hours as needed. 05/09/18   Sable Feil, PA-C  oxyCODONE-acetaminophen (ROXICET) 5-325 MG tablet Take 1 tablet by mouth every 4 (four) hours as needed for severe pain. 02/19/17   [name], MD  traMADol (ULTRAM) 50 MG tablet Take 1 tablet (50 mg total) by mouth every 6 (six) hours as needed for up to 3 days. 11/26/18 11/29/18  Lannie Fields, PA-C    Allergies Chlorhexidine; Sulfur; Demerol [meperidine]; and Morphine and related  Family History  Problem Relation Age  of Onset  . Breast cancer Maternal Grandmother 52  . Breast cancer Paternal Grandfather 75    Social History Social History   Tobacco Use  . Smoking status: Current Every Day Smoker    Packs/day: 0.50    Years: 28.00    Pack years: 14.00    Types: Cigarettes  . Smokeless tobacco: Never Used  Substance Use Topics  . Alcohol use: No    Alcohol/week: 0.0 standard drinks  . Drug use: No     Review of Systems  Constitutional: No fever/chills Eyes: No visual changes. No discharge ENT: No upper respiratory complaints. Cardiovascular: no chest pain. Respiratory: no cough. No SOB. Gastrointestinal: No abdominal  pain.  No nausea, no vomiting.  No diarrhea.  No constipation. Musculoskeletal: Negative for musculoskeletal pain. Skin: Patient has abscess of left labia majora.  Neurological: Negative for headaches, focal weakness or numbness.  ____________________________________________   PHYSICAL EXAM:  VITAL SIGNS: ED Triage Vitals [11/26/18 1513]  Enc Vitals Group     BP 131/78     Pulse Rate (!) 101     Resp 16     Temp 98.7 F (37.1 C)     Temp Source Oral     SpO2 96 %     Weight 165 lb (74.8 kg)     Height 5\' 4"  (1.626 m)     Head Circumference      Peak Flow      Pain Score 7     Pain Loc      Pain Edu?      Excl. in [place]?      Constitutional: Alert and oriented. Well appearing and in no acute distress. Eyes: Conjunctivae are normal. PERRL. EOMI. Head: Atraumatic.  Cardiovascular: Normal rate, regular rhythm. Normal S1 and S2.  Good peripheral circulation. Respiratory: Normal respiratory effort without tachypnea or retractions. Lungs CTAB. Good air entry to the bases with no decreased or absent breath sounds. Gastrointestinal: Bowel sounds 4 quadrants. Soft and nontender to palpation. No guarding or rigidity. No palpable masses. No distention. No CVA tenderness. Genitourinary: Patient has 2 cm x 2 cm abscess of left labia majora that is fluctuant and erythematous. Musculoskeletal: Full range of motion to all extremities. No gross deformities appreciated. Neurologic:  Normal speech and language. No gross focal neurologic deficits are appreciated.  Skin:  Skin is warm, dry and intact. No rash noted. Psychiatric: Mood and affect are normal. Speech and behavior are normal. Patient exhibits appropriate insight and judgement.   ____________________________________________   LABS (all labs ordered are listed, but only abnormal results are displayed)  Labs Reviewed - No data to  display ____________________________________________  EKG   ____________________________________________  RADIOLOGY  No results found.  ____________________________________________    PROCEDURES  Procedure(s) performed:    Procedures  INCISION AND DRAINAGE Performed by: Lannie Fields Consent: Verbal consent obtained. Risks and benefits: risks, benefits and alternatives were discussed Type: abscess  Body area: Left labia majora  Anesthesia: local infiltration  Incision was made with a scalpel.  Local anesthetic: lidocaine 1% without epinephrine  Anesthetic total: 2 ml of lidocaine 1% without epi and 3 ml of LET   Complexity: complex Blunt dissection to break up loculations  Drainage: purulent  Drainage amount: Copious    Patient tolerance: Patient tolerated the procedure well with no immediate complications.     Medications  lidocaine (XYLOCAINE) 1 % (with pres) injection 5 mL (has no administration in time range)  oxyCODONE-acetaminophen (PERCOCET/ROXICET) 5-325 MG per  tablet 1 tablet (1 tablet Oral Given 11/26/18 1727)  ondansetron (ZOFRAN-ODT) disintegrating tablet 4 mg (4 mg Oral Given 11/26/18 1727)  lidocaine-EPINEPHrine-tetracaine (LET) solution (3 mLs Topical Given 11/26/18 1728)     ____________________________________________   INITIAL IMPRESSION / ASSESSMENT AND PLAN / ED COURSE  Pertinent labs & imaging results that were available during my care of the patient were reviewed by me and considered in my medical decision making (see chart for details).  Review of the [place] CSRS was performed in accordance of the [place] prior to dispensing any controlled drugs.         Assessment and Plan: Abscess:  48 year old female presents to the emergency department with a left-sided labia majora abscess.  Patient underwent incision and drainage in the emergency department without complication.  She was discharged with doxycycline and Keflex.  She was  advised to follow-up with primary care as needed.  All patient questions were answered.   ____________________________________________  FINAL CLINICAL IMPRESSION(S) / ED DIAGNOSES  Final diagnoses:  Labial abscess      NEW MEDICATIONS STARTED DURING THIS VISIT:  ED Discharge Orders         Ordered    cephALEXin (KEFLEX) 500 MG capsule  3 times daily     11/26/18 1849    doxycycline (VIBRAMYCIN) 100 MG capsule  2 times daily     11/26/18 1849    traMADol (ULTRAM) 50 MG tablet  Every 6 hours PRN     11/26/18 1849              This chart was dictated using voice recognition software/Dragon. Despite best efforts to proofread, errors can occur which can change the meaning. Any change was purely unintentional.    Lannie Fields, PA-C 11/26/18 1853    Nance Pear, MD 11/26/18 [phone]

## 2018-11-26 NOTE — ED Triage Notes (Signed)
Patient c/o boil X1 week in private area. Denies drainage. Reports hx of same

## 2019-01-07 ENCOUNTER — Other Ambulatory Visit: Payer: Self-pay

## 2019-01-07 DIAGNOSIS — Z20822 Contact with and (suspected) exposure to covid-19: Secondary | ICD-10-CM

## 2019-01-10 LAB — NOVEL CORONAVIRUS, NAA: SARS-CoV-2, NAA: NOT DETECTED

## 2019-05-21 ENCOUNTER — Other Ambulatory Visit: Payer: Self-pay

## 2019-05-21 ENCOUNTER — Emergency Department
Admission: EM | Admit: 2019-05-21 | Discharge: 2019-05-21 | Disposition: A | Discharge status: Home / Self Care | Payer: Self-pay | Attending: Emergency Medicine | Admitting: Emergency Medicine

## 2019-05-21 ENCOUNTER — Emergency Department: Payer: Self-pay

## 2019-05-21 DIAGNOSIS — F17210 Nicotine dependence, cigarettes, uncomplicated: Secondary | ICD-10-CM | POA: Insufficient documentation

## 2019-05-21 DIAGNOSIS — E119 Type 2 diabetes mellitus without complications: Secondary | ICD-10-CM | POA: Insufficient documentation

## 2019-05-21 DIAGNOSIS — R1030 Lower abdominal pain, unspecified: Secondary | ICD-10-CM | POA: Insufficient documentation

## 2019-05-21 DIAGNOSIS — Z79899 Other long term (current) drug therapy: Secondary | ICD-10-CM | POA: Insufficient documentation

## 2019-05-21 DIAGNOSIS — K625 Hemorrhage of anus and rectum: Secondary | ICD-10-CM | POA: Insufficient documentation

## 2019-05-21 LAB — COMPREHENSIVE METABOLIC PANEL
ALT: 9 U/L (ref 0–44)
AST: 12 U/L — ABNORMAL LOW (ref 15–41)
Albumin: 3.5 g/dL (ref 3.5–5.0)
Alkaline Phosphatase: 77 U/L (ref 38–126)
Anion gap: 8 (ref 5–15)
BUN: 12 mg/dL (ref 6–20)
CO2: 26 mmol/L (ref 22–32)
Calcium: 9.1 mg/dL (ref 8.9–10.3)
Chloride: 102 mmol/L (ref 98–111)
Creatinine, Ser: 0.79 mg/dL (ref 0.44–1.00)
GFR calc Af Amer: >60 mL/min (ref >60)
GFR calc non Af Amer: >60 mL/min (ref >60)
Glucose, Bld: 133 mg/dL — ABNORMAL HIGH (ref 70–99)
Potassium: 3.7 mmol/L (ref 3.5–5.1)
Sodium: 136 mmol/L (ref 135–145)
Total Bilirubin: 0.3 mg/dL (ref 0.3–1.2)
Total Protein: 6.6 g/dL (ref 6.5–8.1)

## 2019-05-21 LAB — TYPE AND SCREEN
ABO/RH(D): A POS
Antibody Screen: NEGATIVE

## 2019-05-21 LAB — CBC
HCT: 41.6 % (ref 36.0–46.0)
Hemoglobin: 13.9 g/dL (ref 12.0–15.0)
MCH: 31.7 pg (ref 26.0–34.0)
MCHC: 33.4 g/dL (ref 30.0–36.0)
MCV: 94.8 fL (ref 80.0–100.0)
Platelets: 287 10*3/uL (ref 150–400)
RBC: 4.39 MIL/uL (ref 3.87–5.11)
RDW: 12.9 % (ref 11.5–15.5)
WBC: 14.7 10*3/uL — ABNORMAL HIGH (ref 4.0–10.5)
nRBC: 0 % (ref 0.0–0.2)

## 2019-05-21 MED ORDER — IOHEXOL 9 MG/ML PO SOLN
500.0000 mL | Freq: Once | ORAL | Status: DC | PRN
  Administered 2019-05-21: 500 mL via ORAL
  Filled 2019-05-21: qty 500

## 2019-05-21 MED ORDER — IOHEXOL 300 MG/ML  SOLN
100.0000 mL | Freq: Once | INTRAMUSCULAR | Status: AC | PRN
  Administered 2019-05-21: 05:00:00 100 mL via INTRAVENOUS

## 2019-05-21 MED ORDER — SODIUM CHLORIDE 0.9 % IV BOLUS
1000.0000 mL | Freq: Once | INTRAVENOUS | Status: AC
  Administered 2019-05-21: 04:00:00 1000 mL via INTRAVENOUS

## 2019-05-21 NOTE — Discharge Instructions (Signed)
Return to the ER for recurrent or worsening symptoms, persistent vomiting, difficulty breathing, fainting or other concerns.

## 2019-05-21 NOTE — ED Triage Notes (Signed)
Pt A&O, ambulatory. Pt states she has bowel problems. Showed this RN a picture of bright red blood that came from her rectum. States bleeding intermittently x 1 week. Denies blood thinner use.

## 2019-05-21 NOTE — ED Provider Notes (Signed)
[facility] Emergency Department Provider Note   ____________________________________________   First MD Initiated Contact with Patient 05/21/19 0320     (approximate)  I have reviewed the triage vital signs and the nursing notes.   HISTORY  Chief Complaint Rectal Bleeding    HPI [name] is a 48 y.o. female who presents to the ED from home with a chief complaint of rectal bleeding.  Patient states she has always had "bowel problems", most recently rectal bleeding x2 weeks associated with some low abdominal cramping.  Shows me a picture on her cell phone that demonstrates bright red blood with a small blood clot.  Presents tonight only because her daughter insisted that she comes.  Denies anticoagulant use.  Denies fever, chest pain, shortness of breath, nausea, vomiting, dizziness.  Denies anal intercourse.  Similar symptoms previously.  Had an EGD and colonoscopy in 2014 which demonstrated gastropathy and polyps (I personally reviewed patient's old records).  Patient failed to follow-up with GI because she did not like him.     Past Medical History:  Diagnosis Date   Back pain    Cancer ([place])    + CANCER CELLS TO UTERUS   Diabetes mellitus without complication ([place])    Family history of adverse reaction to anesthesia    FATHER STOPS BREATHING WITH ANESTHESIA   Headache    HX MIGRAINES   Seizures ([place])    1 SEIZURE  VERY STRESSED    Patient Active Problem List   Diagnosis Date Noted   Degenerative disc disease, lumbar 08/24/2015    Past Surgical History:  Procedure Laterality Date   ABDOMINAL HYSTERECTOMY     APPENDECTOMY     BACK SURGERY      Prior to Admission medications   Medication Sig Start Date End Date Taking? Authorizing Provider  acetaminophen (TYLENOL) 325 MG tablet Take 650 mg by mouth every 6 (six) hours as needed for mild pain.   Yes [provider]  azithromycin (ZITHROMAX Z-PAK) 250 MG tablet 1  tab by mouth daily Patient not taking: Reported on 05/21/2019 06/08/16   Delman Kitten, MD  cyclobenzaprine (FLEXERIL) 10 MG tablet Take 1 tablet (10 mg total) by mouth 3 (three) times daily as needed. Patient not taking: Reported on 05/21/2019 04/27/16   [name], MD  cyclobenzaprine (FLEXERIL) 10 MG tablet Take 1 tablet (10 mg total) by mouth 3 (three) times daily as needed. Patient not taking: Reported on 05/21/2019 06/06/17   [name], MD  ibuprofen (ADVIL,MOTRIN) 600 MG tablet Take 1 tablet (600 mg total) by mouth every 6 (six) hours as needed. Patient not taking: Reported on 05/21/2019 11/10/16   Rudene Re, MD  lidocaine (LIDODERM) 5 % Place 1 patch onto the skin daily. Remove & Discard patch within 12 hours or as directed by MD Patient not taking: Reported on 05/21/2019 06/06/17   [name], MD  methocarbamol (ROBAXIN) 500 MG tablet Take 1 tablet (500 mg total) by mouth 4 (four) times daily. Patient not taking: Reported on 05/21/2019 11/10/16   Rudene Re, MD  ondansetron (ZOFRAN) 8 MG tablet Take 1 tablet (8 mg total) by mouth every 8 (eight) hours as needed for nausea or vomiting. Patient not taking: Reported on 05/21/2019 09/15/17   Sable Feil, PA-C  oxyCODONE-acetaminophen (PERCOCET) 7.5-325 MG tablet Take 1 tablet by mouth every 6 (six) hours as needed. Patient not taking: Reported on 05/21/2019 05/09/18   Sable Feil, PA-C  oxyCODONE-acetaminophen (  ROXICET) 5-325 MG tablet Take 1 tablet by mouth every 4 (four) hours as needed for severe pain. Patient not taking: Reported on 05/21/2019 02/19/17   [name], MD    Allergies Chlorhexidine, Demerol [meperidine], Morphine and related, and Sulfur  Family History  Problem Relation Age of Onset   Breast cancer Maternal Grandmother 48   Breast cancer Paternal Grandfather 29    Social History Social History   Tobacco Use   Smoking status: Current Every Day Smoker     Packs/day: 0.50    Years: 28.00    Pack years: 14.00    Types: Cigarettes   Smokeless tobacco: Never Used  Substance Use Topics   Alcohol use: No    Alcohol/week: 0.0 standard drinks   Drug use: No    Review of Systems  Constitutional: No fever/chills Eyes: No visual changes. ENT: No sore throat. Cardiovascular: Denies chest pain. Respiratory: Denies shortness of breath. Gastrointestinal: Positive for lower abdominal cramping and rectal bleeding.  No nausea, no vomiting.  No diarrhea.  No constipation. Genitourinary: Negative for dysuria. Musculoskeletal: Negative for back pain. Skin: Negative for rash. Neurological: Negative for headaches, focal weakness or numbness.   ____________________________________________   PHYSICAL EXAM:  VITAL SIGNS: ED Triage Vitals  Enc Vitals Group     BP 05/21/19 0257 (!) 153/91     Pulse Rate 05/21/19 0257 (!) 102     Resp 05/21/19 0257 16     Temp 05/21/19 0257 98.3 F (36.8 C)     Temp Source 05/21/19 0257 Oral     SpO2 05/21/19 0257 98 %     Weight 05/21/19 0258 165 lb (74.8 kg)     Height 05/21/19 0258 5\' 4"  (1.626 m)     Head Circumference --      Peak Flow --      Pain Score 05/21/19 0257 6     Pain Loc --      Pain Edu? --      Excl. in [place]? --     Constitutional: Alert and oriented. Well appearing and in no acute distress. Eyes: Conjunctivae are normal. PERRL. EOMI. Head: Atraumatic. Nose: No congestion/rhinnorhea. Mouth/Throat: Mucous membranes are moist.  Oropharynx non-erythematous. Neck: No stridor.   Cardiovascular: Normal rate, regular rhythm. Grossly normal heart sounds.  Good peripheral circulation. Respiratory: Normal respiratory effort.  No retractions. Lungs CTAB. Gastrointestinal: Soft and mildly tender to palpation left lower quadrant without rebound or guarding. No distention. No abdominal bruits. No CVA tenderness. Musculoskeletal: No lower extremity tenderness nor edema.  No joint  effusions. Neurologic:  Normal speech and language. No gross focal neurologic deficits are appreciated. No gait instability. Skin:  Skin is warm, dry and intact. No rash noted. Psychiatric: Mood and affect are normal. Speech and behavior are normal.  ____________________________________________   LABS (all labs ordered are listed, but only abnormal results are displayed)  Labs Reviewed  CBC - Abnormal; Notable for the following components:      Result Value   WBC 14.7 (*)    All other components within normal limits  COMPREHENSIVE METABOLIC PANEL - Abnormal; Notable for the following components:   Glucose, Bld 133 (*)    AST 12 (*)    All other components within normal limits  TYPE AND SCREEN  TYPE AND SCREEN   ____________________________________________  EKG  None ____________________________________________  RADIOLOGY  ED MD interpretation: Mild hypervascularity of rectum, could represent hemorrhoidal enhancement  Official radiology report(s): Ct Abdomen Pelvis W Contrast  Result Date: 05/21/2019 CLINICAL DATA:  Left lower quadrant pain, rectal bleeding EXAM: CT ABDOMEN AND PELVIS WITH CONTRAST TECHNIQUE: Multidetector CT imaging of the abdomen and pelvis was performed using the standard protocol following bolus administration of intravenous contrast. CONTRAST:  110mL OMNIPAQUE IOHEXOL 300 MG/ML  SOLN COMPARISON:  CT abdomen pelvis 10/16/2010 FINDINGS: Lower chest: Lung bases are clear. Normal heart size. No pericardial effusion. Hepatobiliary: No focal liver abnormality is seen. No gallstones, gallbladder wall thickening, or biliary dilatation. Pancreas: Unremarkable. No pancreatic ductal dilatation or surrounding inflammatory changes. Spleen: Normal in size without focal abnormality. Adrenals/Urinary Tract: Adrenal glands are unremarkable. Kidneys are normal, without renal calculi, focal lesion, or hydronephrosis. Bladder is unremarkable. Stomach/Bowel: Distal esophagus,  stomach and duodenal sweep are unremarkable. No small bowel wall thickening or dilatation. No evidence of obstruction. A normal appendix is visualized. No colonic dilatation or wall thickening. Small amount enhancement at the level of the anal verge. Vascular/Lymphatic: Atherosclerotic plaque within the normal caliber aorta. No suspicious or enlarged lymph nodes in the included lymphatic chains. Reproductive: Uterus is surgically absent. No concerning adnexal lesions. Other: No abdominopelvic free fluid or free gas. No bowel containing hernias. Mild body wall edema. Musculoskeletal: L4-5 posterior spinal fusion without evidence of hardware complication. Degenerative changes at L5-S1 as well with partial sacralization of the transverse processes. IMPRESSION: 1. Mild hypervascularity of the rectum, could hemorrhoidal enhancement. Correlate with rectal exam. 2. No other acute abdominopelvic process. 3. Prior L4-5 posterior spinal fusion without hardware complication. 4. Aortic Atherosclerosis (ICD10-I70.0). Electronically Signed   By: Lovena Le M.D.   On: 05/21/2019 05:50    ____________________________________________   PROCEDURES  Procedure(s) performed (including Critical Care):  Procedures  Rectal exam: External exam reveals 2 small nonbleeding and nonthrombosed hemorrhoids.  DRE reveals scant bright red blood on gloved finger.  ____________________________________________   INITIAL IMPRESSION / ASSESSMENT AND PLAN / ED COURSE  As part of my medical decision making, I reviewed the following data within the [facility] notes reviewed and incorporated, Labs reviewed, Old chart reviewed, Radiograph reviewed and Notes from prior ED visits     JAIMELEE HANOVER was evaluated in Emergency Department on 05/21/2019 for the symptoms described in the history of present illness. She was evaluated in the context of the global COVID-19 pandemic, which necessitated consideration  that the patient might be at risk for infection with the SARS-CoV-2 virus that causes COVID-19. Institutional protocols and algorithms that pertain to the evaluation of patients at risk for COVID-19 are in a state of rapid change based on information released by regulatory bodies including the CDC and federal and state organizations. These policies and algorithms were followed during the patient's care in the ED.    48 year old female presenting with rectal bleeding. Differential diagnosis includes, but is not limited to, bowel obstruction, colitis, diverticulitis, gastroenteritis, hernia, etc.   We will obtain basic lab work, orthostatic vital signs.  Proceed with CT abdomen/pelvis.   Clinical Course as of May 20 634  Fri May 21, 2019  [id number] Updated patient on all lab and imaging results.  Will refer to GI for follow-up.  Strict return precautions given.  Patient verbalizes understanding agrees with plan of care.   [JS]    Clinical Course User Index [JS] Paulette Blanch, MD     ____________________________________________   FINAL CLINICAL IMPRESSION(S) / ED DIAGNOSES  Final diagnoses:  Rectal bleeding     ED Discharge Orders    None  Note:  This document was prepared using Dragon voice recognition software and may include unintentional dictation errors.   Paulette Blanch, MD 05/21/19 [phone]

## 2019-05-24 ENCOUNTER — Telehealth: Payer: Self-pay | Admitting: Gastroenterology

## 2019-05-24 NOTE — Telephone Encounter (Signed)
Left vm to offer apt with Dr. Vicente Males for Ed f/u

## 2019-05-25 ENCOUNTER — Telehealth: Payer: Self-pay | Admitting: Gastroenterology

## 2019-05-25 NOTE — Telephone Encounter (Signed)
NO VM TO OFFER APT FOR TOMORROW WITH DR. ANNA PER DIJAH'S NOTE

## 2019-05-26 ENCOUNTER — Encounter

## 2019-05-26 ENCOUNTER — Ambulatory Visit: Payer: Self-pay | Admitting: Gastroenterology

## 2019-05-26 ENCOUNTER — Other Ambulatory Visit: Payer: Self-pay

## 2019-05-26 VITALS — BP 109/75 | HR 79 | Temp 98.1°F | Ht 64.0 in | Wt 153.4 lb

## 2019-05-26 DIAGNOSIS — K5900 Constipation, unspecified: Secondary | ICD-10-CM

## 2019-05-26 DIAGNOSIS — K625 Hemorrhage of anus and rectum: Secondary | ICD-10-CM

## 2019-05-26 NOTE — Patient Instructions (Signed)

## 2019-05-26 NOTE — Progress Notes (Signed)
Jonathon Bellows MD, MRCP(U.K) [address]  [place]  [place], [place] [address]  Main: [phone]  Fax: [phone]   Gastroenterology Consultation  Referring Provider:    Emergency room  primary Care Physician:  Patient, No Pcp Per Primary Gastroenterologist:  Dr. Jonathon Bellows  Reason for Consultation:     Rectal bleeding        HPI:   [name] is a 48 y.o. y/o female referred for consultation & management  by Dr. Patient, No Pcp Per.    She presented to the emergency room on 05/21/2019 with rectal bleeding.  It was ongoing for 2 weeks.  Some abdominal cramping.  She underwent a CT scan of the abdomen that demonstrated mild hypervascularity of the rectum which could have been hemorrhoidal enhancement.  No other gross abnormalities.  Hemoglobin 13.9 g with an MCV of 94.8 CMP was normal except a mildly elevated blood glucose.   She said that it only happened on 1 occasion where she was severely constipated tried manual disimpaction then had a bowel movement with bright red blood.  She says she can go months without a bowel movement.  That is her baseline.  Last colonoscopy was 3 to 4 years back and that she had many polyps.  Family history of colon polyps. Past Medical History:  Diagnosis Date  . Back pain   . Cancer (HCC)    + CANCER CELLS TO UTERUS  . Diabetes mellitus without complication ([place])   . Family history of adverse reaction to anesthesia    FATHER STOPS BREATHING WITH ANESTHESIA  . Headache    HX MIGRAINES  . Seizures ([place])    1 SEIZURE  VERY STRESSED    Past Surgical History:  Procedure Laterality Date  . ABDOMINAL HYSTERECTOMY    . APPENDECTOMY    . BACK SURGERY      Prior to Admission medications   Medication Sig Start Date End Date Taking? Authorizing Provider  acetaminophen (TYLENOL) 325 MG tablet Take 650 mg by mouth every 6 (six) hours as needed for mild pain.    [provider]  azithromycin (ZITHROMAX Z-PAK) 250 MG tablet 1 tab  by mouth daily Patient not taking: Reported on 05/21/2019 06/08/16   Delman Kitten, MD  cyclobenzaprine (FLEXERIL) 10 MG tablet Take 1 tablet (10 mg total) by mouth 3 (three) times daily as needed. Patient not taking: Reported on 05/21/2019 04/27/16   [name], MD  cyclobenzaprine (FLEXERIL) 10 MG tablet Take 1 tablet (10 mg total) by mouth 3 (three) times daily as needed. Patient not taking: Reported on 05/21/2019 06/06/17   [name], MD  ibuprofen (ADVIL,MOTRIN) 600 MG tablet Take 1 tablet (600 mg total) by mouth every 6 (six) hours as needed. Patient not taking: Reported on 05/21/2019 11/10/16   Rudene Re, MD  lidocaine (LIDODERM) 5 % Place 1 patch onto the skin daily. Remove & Discard patch within 12 hours or as directed by MD Patient not taking: Reported on 05/21/2019 06/06/17   [name], MD  methocarbamol (ROBAXIN) 500 MG tablet Take 1 tablet (500 mg total) by mouth 4 (four) times daily. Patient not taking: Reported on 05/21/2019 11/10/16   Rudene Re, MD  ondansetron (ZOFRAN) 8 MG tablet Take 1 tablet (8 mg total) by mouth every 8 (eight) hours as needed for nausea or vomiting. Patient not taking: Reported on 05/21/2019 09/15/17   Sable Feil, PA-C  oxyCODONE-acetaminophen (PERCOCET) 7.5-325 MG tablet Take 1 tablet  by mouth every 6 (six) hours as needed. Patient not taking: Reported on 05/21/2019 05/09/18   Sable Feil, PA-C  oxyCODONE-acetaminophen (ROXICET) 5-325 MG tablet Take 1 tablet by mouth every 4 (four) hours as needed for severe pain. Patient not taking: Reported on 05/21/2019 02/19/17   [name], MD    Family History  Problem Relation Age of Onset  . Breast cancer Maternal Grandmother 3  . Breast cancer Paternal Grandfather 21     Social History   Tobacco Use  . Smoking status: Current Every Day Smoker    Packs/day: 0.50    Years: 28.00    Pack years: 14.00    Types: Cigarettes  . Smokeless tobacco: Never Used   Substance Use Topics  . Alcohol use: No    Alcohol/week: 0.0 standard drinks  . Drug use: No    Allergies as of 05/26/2019 - Review Complete 05/21/2019  Allergen Reaction Noted  . Chlorhexidine Itching 08/24/2015  . Demerol [meperidine] Rash 05/26/2015  . Morphine and related Hives and Rash 05/26/2015  . Sulfur Rash 11/26/2018    Review of Systems:    All systems reviewed and negative except where noted in HPI.   Physical Exam:  There were no vitals taken for this visit. No LMP recorded. Patient has had a hysterectomy. Psych:  Alert and cooperative. Normal mood and affect. General:   Alert,  Well-developed, well-nourished, pleasant and cooperative in NAD Head:  Normocephalic and atraumatic. Eyes:  Sclera clear, no icterus.   Conjunctiva pink. Ears:  Normal auditory acuity. Nose:  No deformity, discharge, or lesions. Mouth:  No deformity or lesions,oropharynx pink & moist. Neck:  Supple; no masses or thyromegaly. Lungs:  Respirations even and unlabored.  Clear throughout to auscultation.   No wheezes, crackles, or rhonchi. No acute distress. Heart:  Regular rate and rhythm; no murmurs, clicks, rubs, or gallops. Abdomen:  Normal bowel sounds.  No bruits.  Soft, non-tender and non-distended without masses, hepatosplenomegaly or hernias noted.  No guarding or rebound tenderness.    Neurologic:  Alert and oriented x3;  grossly normal neurologically. Skin:  Intact without significant lesions or rashes. No jaundice. Lymph Nodes:  No significant cervical adenopathy. Psych:  Alert and cooperative. Normal mood and affect.  Imaging Studies: Ct Abdomen Pelvis W Contrast  Result Date: 05/21/2019 CLINICAL DATA:  Left lower quadrant pain, rectal bleeding EXAM: CT ABDOMEN AND PELVIS WITH CONTRAST TECHNIQUE: Multidetector CT imaging of the abdomen and pelvis was performed using the standard protocol following bolus administration of intravenous contrast. CONTRAST:  156mL OMNIPAQUE IOHEXOL 300  MG/ML  SOLN COMPARISON:  CT abdomen pelvis 10/16/2010 FINDINGS: Lower chest: Lung bases are clear. Normal heart size. No pericardial effusion. Hepatobiliary: No focal liver abnormality is seen. No gallstones, gallbladder wall thickening, or biliary dilatation. Pancreas: Unremarkable. No pancreatic ductal dilatation or surrounding inflammatory changes. Spleen: Normal in size without focal abnormality. Adrenals/Urinary Tract: Adrenal glands are unremarkable. Kidneys are normal, without renal calculi, focal lesion, or hydronephrosis. Bladder is unremarkable. Stomach/Bowel: Distal esophagus, stomach and duodenal sweep are unremarkable. No small bowel wall thickening or dilatation. No evidence of obstruction. A normal appendix is visualized. No colonic dilatation or wall thickening. Small amount enhancement at the level of the anal verge. Vascular/Lymphatic: Atherosclerotic plaque within the normal caliber aorta. No suspicious or enlarged lymph nodes in the included lymphatic chains. Reproductive: Uterus is surgically absent. No concerning adnexal lesions. Other: No abdominopelvic free fluid or free gas. No bowel containing hernias. Mild body wall  edema. Musculoskeletal: L4-5 posterior spinal fusion without evidence of hardware complication. Degenerative changes at L5-S1 as well with partial sacralization of the transverse processes. IMPRESSION: 1. Mild hypervascularity of the rectum, could hemorrhoidal enhancement. Correlate with rectal exam. 2. No other acute abdominopelvic process. 3. Prior L4-5 posterior spinal fusion without hardware complication. 4. Aortic Atherosclerosis (ICD10-I70.0). Electronically Signed   By: Lovena Le M.D.   On: 05/21/2019 05:50    Assessment and Plan:   LASONJA SPEHAR is a 48 y.o. y/o female has been referred for rectal bleeding.  Likely secondary to constipation.  History of colon polyps and states she is due for surveillance colonoscopy.  Last was 3 to 4 years back.  Family  history of colon polyps.  Plan 1.  Stop NSAIDs 2.  Colonoscopy 3.  High-fiber diet counseled on target of 25 g/day.  Provided patient information.  Provided fiber pills.  Commence on Linzess to 290 mcg a day.  She will call me in a week to inform me if she is doing better or not.  If no better will change to Trulance.     I have discussed alternative options, risks & benefits,  which include, but are not limited to, bleeding, infection, perforation,respiratory complication & drug reaction.  The patient agrees with this plan & written consent will be obtained.     Follow up in 6-8 weeks   Dr Jonathon Bellows MD,MRCP(U.K)

## 2019-05-28 ENCOUNTER — Other Ambulatory Visit
Admission: RE | Admit: 2019-05-28 | Discharge: 2019-05-28 | Disposition: A | Discharge status: Home / Self Care | Payer: Self-pay | Source: Ambulatory Visit | Attending: Gastroenterology | Admitting: Gastroenterology

## 2019-05-28 DIAGNOSIS — Z20828 Contact with and (suspected) exposure to other viral communicable diseases: Secondary | ICD-10-CM | POA: Insufficient documentation

## 2019-05-28 DIAGNOSIS — Z01812 Encounter for preprocedural laboratory examination: Secondary | ICD-10-CM | POA: Insufficient documentation

## 2019-05-28 LAB — SARS CORONAVIRUS 2 (TAT 6-24 HRS): SARS Coronavirus 2: NEGATIVE

## 2019-06-01 ENCOUNTER — Ambulatory Visit: Payer: Self-pay | Admitting: Registered Nurse

## 2019-06-01 ENCOUNTER — Ambulatory Visit
Admission: RE | Admit: 2019-06-01 | Discharge: 2019-06-01 | Disposition: A | Discharge status: Home / Self Care | Payer: Self-pay | Attending: Gastroenterology | Admitting: Gastroenterology

## 2019-06-01 ENCOUNTER — Encounter: Payer: Self-pay | Admitting: Emergency Medicine

## 2019-06-01 ENCOUNTER — Other Ambulatory Visit: Payer: Self-pay

## 2019-06-01 ENCOUNTER — Encounter
Admission: RE | Disposition: A | Discharge status: Home / Self Care | Payer: Self-pay | Source: Home / Self Care | Attending: Gastroenterology

## 2019-06-01 DIAGNOSIS — K635 Polyp of colon: Secondary | ICD-10-CM

## 2019-06-01 DIAGNOSIS — Z86010 Personal history of colonic polyps: Secondary | ICD-10-CM | POA: Insufficient documentation

## 2019-06-01 DIAGNOSIS — F17210 Nicotine dependence, cigarettes, uncomplicated: Secondary | ICD-10-CM | POA: Insufficient documentation

## 2019-06-01 DIAGNOSIS — D122 Benign neoplasm of ascending colon: Secondary | ICD-10-CM | POA: Insufficient documentation

## 2019-06-01 DIAGNOSIS — Z8542 Personal history of malignant neoplasm of other parts of uterus: Secondary | ICD-10-CM | POA: Insufficient documentation

## 2019-06-01 DIAGNOSIS — K625 Hemorrhage of anus and rectum: Secondary | ICD-10-CM | POA: Insufficient documentation

## 2019-06-01 DIAGNOSIS — D124 Benign neoplasm of descending colon: Secondary | ICD-10-CM | POA: Insufficient documentation

## 2019-06-01 DIAGNOSIS — K5900 Constipation, unspecified: Secondary | ICD-10-CM | POA: Insufficient documentation

## 2019-06-01 DIAGNOSIS — D120 Benign neoplasm of cecum: Secondary | ICD-10-CM | POA: Insufficient documentation

## 2019-06-01 DIAGNOSIS — Z803 Family history of malignant neoplasm of breast: Secondary | ICD-10-CM | POA: Insufficient documentation

## 2019-06-01 DIAGNOSIS — D125 Benign neoplasm of sigmoid colon: Secondary | ICD-10-CM | POA: Insufficient documentation

## 2019-06-01 DIAGNOSIS — Z1211 Encounter for screening for malignant neoplasm of colon: Secondary | ICD-10-CM | POA: Insufficient documentation

## 2019-06-01 DIAGNOSIS — E119 Type 2 diabetes mellitus without complications: Secondary | ICD-10-CM | POA: Insufficient documentation

## 2019-06-01 DIAGNOSIS — G43909 Migraine, unspecified, not intractable, without status migrainosus: Secondary | ICD-10-CM | POA: Insufficient documentation

## 2019-06-01 HISTORY — PX: COLONOSCOPY WITH PROPOFOL: SHX5780

## 2019-06-01 LAB — GLUCOSE, CAPILLARY
Glucose-Capillary: 88 mg/dL (ref 70–99)
Glucose-Capillary: 120 mg/dL — ABNORMAL HIGH (ref 70–99)

## 2019-06-01 SURGERY — COLONOSCOPY WITH PROPOFOL
Anesthesia: General

## 2019-06-01 MED ORDER — SODIUM CHLORIDE 0.9 % IV SOLN
INTRAVENOUS | Status: DC
  Administered 2019-06-01: 08:00:00 1000 mL via INTRAVENOUS

## 2019-06-01 MED ORDER — PROPOFOL 500 MG/50ML IV EMUL
INTRAVENOUS | Status: AC
  Filled 2019-06-01: qty 50

## 2019-06-01 MED ORDER — LIDOCAINE HCL (CARDIAC) PF 100 MG/5ML IV SOSY
PREFILLED_SYRINGE | INTRAVENOUS | Status: DC | PRN
  Administered 2019-06-01: 60 mg via INTRAVENOUS
  Administered 2019-06-01: 40 mg via INTRAVENOUS

## 2019-06-01 MED ORDER — PROPOFOL 10 MG/ML IV BOLUS
INTRAVENOUS | Status: DC | PRN
  Administered 2019-06-01: 20 mg via INTRAVENOUS
  Administered 2019-06-01: 10 mg via INTRAVENOUS
  Administered 2019-06-01: 20 mg via INTRAVENOUS
  Administered 2019-06-01: 40 mg via INTRAVENOUS
  Administered 2019-06-01: 10 mg via INTRAVENOUS

## 2019-06-01 MED ORDER — LIDOCAINE HCL (PF) 2 % IJ SOLN
INTRAMUSCULAR | Status: AC
  Filled 2019-06-01: qty 10

## 2019-06-01 MED ORDER — PROPOFOL 500 MG/50ML IV EMUL
INTRAVENOUS | Status: DC | PRN
  Administered 2019-06-01: 100 ug/kg/min via INTRAVENOUS

## 2019-06-01 NOTE — Anesthesia Post-op Follow-up Note (Signed)
Anesthesia QCDR form completed.        

## 2019-06-01 NOTE — Anesthesia Preprocedure Evaluation (Signed)
Anesthesia Evaluation  Patient identified by MRN, date of birth, ID band Patient awake    Reviewed: Allergy & Precautions, H&P , NPO status , Patient's Chart, lab work & pertinent test results, reviewed documented beta blocker date and time   Airway Mallampati: II   Neck ROM: full    Dental  (+) Poor Dentition   Pulmonary neg pulmonary ROS, Current Smoker and Patient abstained from smoking.,    Pulmonary exam normal        Cardiovascular Exercise Tolerance: Good negative cardio ROS Normal cardiovascular exam Rhythm:regular Rate:Normal     Neuro/Psych  Headaches, Seizures -,  negative psych ROS   GI/Hepatic negative GI ROS, Neg liver ROS,   Endo/Other  negative endocrine ROSdiabetes  Renal/GU negative Renal ROS  negative genitourinary   Musculoskeletal   Abdominal   Peds  Hematology negative hematology ROS (+)   Anesthesia Other Findings Past Medical History: No date: Back pain No date: Cancer ([place])     Comment:  + CANCER CELLS TO UTERUS No date: Diabetes mellitus without complication (HCC) No date: Family history of adverse reaction to anesthesia     Comment:  FATHER STOPS BREATHING WITH ANESTHESIA No date: Headache     Comment:  HX MIGRAINES No date: Seizures ([place])     Comment:  1 SEIZURE  VERY STRESSED Past Surgical History: No date: ABDOMINAL HYSTERECTOMY No date: APPENDECTOMY No date: BACK SURGERY   Reproductive/Obstetrics negative OB ROS                             Anesthesia Physical Anesthesia Plan  ASA: II  Anesthesia Plan: General   Post-op Pain Management:    Induction:   PONV Risk Score and Plan:   Airway Management Planned:   Additional Equipment:   Intra-op Plan:   Post-operative Plan:   Informed Consent: I have reviewed the patients History and Physical, chart, labs and discussed the procedure including the risks, benefits and alternatives for the  proposed anesthesia with the patient or authorized representative who has indicated his/her understanding and acceptance.     Dental Advisory Given  Plan Discussed with: CRNA  Anesthesia Plan Comments:         Anesthesia Quick Evaluation

## 2019-06-01 NOTE — Anesthesia Postprocedure Evaluation (Signed)
Anesthesia Post Note  Patient: [name]  Procedure(s) Performed: COLONOSCOPY WITH PROPOFOL (N/A )  Patient location during evaluation: Endoscopy Anesthesia Type: General Level of consciousness: awake and alert and oriented Pain management: pain level controlled Vital Signs Assessment: post-procedure vital signs reviewed and stable Respiratory status: spontaneous breathing, nonlabored ventilation and respiratory function stable Cardiovascular status: blood pressure returned to baseline and stable Postop Assessment: no signs of nausea or vomiting Anesthetic complications: no     Last Vitals:  Vitals:   06/01/19 0938 06/01/19 0948  BP: 124/86 123/76  Pulse: 68 62  Resp: (!) 21 18  Temp:    SpO2: 100% 100%    Last Pain:  Vitals:   06/01/19 0948  TempSrc:   PainSc: 0-No pain                 Shauntae Reitman

## 2019-06-01 NOTE — Op Note (Signed)
[facility] Patient Name: Leylanni Benefield Procedure Date: 06/01/2019 8:35 AM MRN: [id number] Account #: [id number] Date of Birth: Mar 10, 1971 Admit Type: Outpatient Age: 48 Room: [facility] ENDO ROOM 1 Gender: Female Note Status: Finalized Procedure:             Colonoscopy Indications:           High risk colon cancer surveillance: Personal history                         of colonic polyps Providers:             Jonathon Bellows MD, MD Medicines:             Monitored Anesthesia Care Complications:         No immediate complications. Procedure:             Pre-Anesthesia Assessment:                        - Prior to the procedure, a History and Physical was                         performed, and patient medications, allergies and                         sensitivities were reviewed. The patient's tolerance                         of previous anesthesia was reviewed.                        - The risks and benefits of the procedure and the                         sedation options and risks were discussed with the                         patient. All questions were answered and informed                         consent was obtained.                        - ASA Grade Assessment: II - A patient with mild                         systemic disease.                        After obtaining informed consent, the colonoscope was                         passed under direct vision. Throughout the procedure,                         the patient's blood pressure, pulse, and oxygen                         saturations were monitored continuously. The  Colonoscope was introduced through the anus and                         advanced to the the cecum, identified by the                         appendiceal orifice. The colonoscopy was performed                         with ease. The patient tolerated the procedure well.                         The quality of the  bowel preparation was good. Findings:      The perianal and digital rectal examinations were normal.      A 5 mm polyp was found in the descending colon. The polyp was sessile.       The polyp was removed with a cold snare. Resection was complete, but the       polyp tissue was not retrieved.      A 10 mm polyp was found in the proximal ascending colon. The polyp was       sessile. The polyp was removed with a cold snare. Resection and       retrieval were complete. To prevent bleeding after the polypectomy, one       hemostatic clip was successfully placed. There was no bleeding during,       or at the end, of the procedure.      A 5 mm polyp was found in the cecum. The polyp was sessile. The polyp       was removed with a cold snare. Resection and retrieval were complete.      A 3 mm polyp was found in the sigmoid colon. The polyp was sessile. The       polyp was removed with a cold biopsy forceps. Resection and retrieval       were complete.      The exam was otherwise without abnormality on direct and retroflexion       views. Impression:            - One 5 mm polyp in the descending colon, removed with                         a cold snare. Complete resection. Polyp tissue not                         retrieved.                        - One 10 mm polyp in the proximal ascending colon,                         removed with a cold snare. Resected and retrieved.                         Clip was placed.                        - One 5 mm polyp in the cecum, removed with a cold  snare. Resected and retrieved.                        - One 3 mm polyp in the sigmoid colon, removed with a                         cold biopsy forceps. Resected and retrieved.                        - The examination was otherwise normal on direct and                         retroflexion views. Recommendation:        - Discharge patient to home (with escort).                        - Resume  previous diet.                        - Continue present medications.                        - Await pathology results.                        - Repeat colonoscopy for surveillance based on                         pathology results. Procedure Code(s):     --- Professional ---                        [phone], Colonoscopy, flexible; with removal of                         tumor(s), polyp(s), or other lesion(s) by snare                         technique                        [id number], 5, Colonoscopy, flexible; with biopsy, single                         or multiple Diagnosis Code(s):     --- Professional ---                        Z86.010, Personal history of colonic polyps                        K63.5, Polyp of colon CPT copyright 2019 American Medical Association. All rights reserved. The codes documented in this report are preliminary and upon coder review may  be revised to meet current compliance requirements. Jonathon Bellows, MD Jonathon Bellows MD, MD 06/01/2019 9:14:05 AM This report has been signed electronically. Number of Addenda: 0 Note Initiated On: 06/01/2019 8:35 AM Scope Withdrawal Time: 0 hours 14 minutes 0 seconds  Total Procedure Duration: 0 hours 25 minutes 59 seconds  Estimated Blood Loss:  Estimated blood loss: none.      [facility]

## 2019-06-01 NOTE — Transfer of Care (Signed)
Immediate Anesthesia Transfer of Care Note  Patient: [name]  Procedure(s) Performed: COLONOSCOPY WITH PROPOFOL (N/A )  Patient Location: PACU and Endoscopy Unit  Anesthesia Type:General  Level of Consciousness: drowsy and patient cooperative  Airway & Oxygen Therapy: Patient Spontanous Breathing and Patient connected to nasal cannula oxygen  Post-op Assessment: Report given to RN and Post -op Vital signs reviewed and stable  Post vital signs: Reviewed and stable  Last Vitals:  Vitals Value Taken Time  BP    Temp    Pulse 75 06/01/19 0917  Resp 14 06/01/19 0917  SpO2 100 % 06/01/19 0917  Vitals shown include unvalidated device data.  Last Pain:  Vitals:   06/01/19 0807  TempSrc: Oral  PainSc: 0-No pain         Complications: No apparent anesthesia complications

## 2019-06-01 NOTE — H&P (Signed)
Jonathon Bellows, MD [address], [place], [place], [place], [address] [address], [place], [place], [place], [address] Phone: [phone]  Fax: [phone]  Primary Care Physician:  Patient, No Pcp Per   Pre-Procedure History & Physical: HPI:  [name] is a 48 y.o. female is here for an colonoscopy.   Past Medical History:  Diagnosis Date  . Back pain   . Cancer (HCC)    + CANCER CELLS TO UTERUS  . Diabetes mellitus without complication ([place])   . Family history of adverse reaction to anesthesia    FATHER STOPS BREATHING WITH ANESTHESIA  . Headache    HX MIGRAINES  . Seizures ([place])    1 SEIZURE  VERY STRESSED    Past Surgical History:  Procedure Laterality Date  . ABDOMINAL HYSTERECTOMY    . APPENDECTOMY    . BACK SURGERY      Prior to Admission medications   Medication Sig Start Date End Date Taking? Authorizing Provider  acetaminophen (TYLENOL) 325 MG tablet Take 650 mg by mouth every 6 (six) hours as needed for mild pain.   Yes [provider]  azithromycin (ZITHROMAX Z-PAK) 250 MG tablet 1 tab by mouth daily Patient not taking: Reported on 05/21/2019 06/08/16   Delman Kitten, MD  cyclobenzaprine (FLEXERIL) 10 MG tablet Take 1 tablet (10 mg total) by mouth 3 (three) times daily as needed. Patient not taking: Reported on 05/21/2019 04/27/16   [name], MD  cyclobenzaprine (FLEXERIL) 10 MG tablet Take 1 tablet (10 mg total) by mouth 3 (three) times daily as needed. Patient not taking: Reported on 05/21/2019 06/06/17   [name], MD  ibuprofen (ADVIL,MOTRIN) 600 MG tablet Take 1 tablet (600 mg total) by mouth every 6 (six) hours as needed. Patient not taking: Reported on 05/21/2019 11/10/16   Rudene Re, MD  lidocaine (LIDODERM) 5 % Place 1 patch onto the skin daily. Remove & Discard patch within 12 hours or as directed by MD Patient not taking: Reported on 05/21/2019 06/06/17   [name], MD  methocarbamol  (ROBAXIN) 500 MG tablet Take 1 tablet (500 mg total) by mouth 4 (four) times daily. Patient not taking: Reported on 05/21/2019 11/10/16   Rudene Re, MD  ondansetron (ZOFRAN) 8 MG tablet Take 1 tablet (8 mg total) by mouth every 8 (eight) hours as needed for nausea or vomiting. Patient not taking: Reported on 05/21/2019 09/15/17   Sable Feil, PA-C  oxyCODONE-acetaminophen (PERCOCET) 7.5-325 MG tablet Take 1 tablet by mouth every 6 (six) hours as needed. Patient not taking: Reported on 05/21/2019 05/09/18   Sable Feil, PA-C  oxyCODONE-acetaminophen (ROXICET) 5-325 MG tablet Take 1 tablet by mouth every 4 (four) hours as needed for severe pain. Patient not taking: Reported on 05/21/2019 02/19/17   [name], MD    Allergies as of 05/26/2019 - Review Complete 05/26/2019  Allergen Reaction Noted  . Chlorhexidine Itching 08/24/2015  . Demerol [meperidine] Rash 05/26/2015  . Morphine and related Hives and Rash 05/26/2015  . Sulfur Rash 11/26/2018    Family History  Problem Relation Age of Onset  . Breast cancer Maternal Grandmother 39  . Breast cancer Paternal Grandfather 91    Social History   Socioeconomic History  . Marital status: Single    Spouse name: Not on file  . Number of children: Not on file  . Years of education: Not on file  . Highest education level: Not on file  Occupational History  . Not on file  Social Needs  . Financial resource strain: Not on file  . Food insecurity    Worry: Not on file    Inability: Not on file  . Transportation needs    Medical: Not on file    Non-medical: Not on file  Tobacco Use  . Smoking status: Current Every Day Smoker    Packs/day: 1.00    Years: 28.00    Pack years: 28.00    Types: Cigarettes  . Smokeless tobacco: Never Used  Substance and Sexual Activity  . Alcohol use: No    Alcohol/week: 0.0 standard drinks  . Drug use: No  . Sexual activity: Not on file  Lifestyle  . Physical activity    Days  per week: Not on file    Minutes per session: Not on file  . Stress: Not on file  Relationships  . Social Herbalist on phone: Not on file    Gets together: Not on file    Attends religious service: Not on file    Active member of club or organization: Not on file    Attends meetings of clubs or organizations: Not on file    Relationship status: Not on file  . Intimate partner violence    Fear of current or ex partner: Not on file    Emotionally abused: Not on file    Physically abused: Not on file    Forced sexual activity: Not on file  Other Topics Concern  . Not on file  Social History Narrative  . Not on file    Review of Systems: See HPI, otherwise negative ROS  Physical Exam: BP (!) 127/98   Pulse 83   Temp 98.1 F (36.7 C) (Oral)   Resp 18   Ht 5\' 4"  (1.626 m)   Wt 69.4 kg   SpO2 100%   BMI 26.26 kg/m  General:   Alert,  pleasant and cooperative in NAD Head:  Normocephalic and atraumatic. Neck:  Supple; no masses or thyromegaly. Lungs:  Clear throughout to auscultation, normal respiratory effort.    Heart:  +S1, +S2, Regular rate and rhythm, No edema. Abdomen:  Soft, nontender and nondistended. Normal bowel sounds, without guarding, and without rebound.   Neurologic:  Alert and  oriented x4;  grossly normal neurologically.  Impression/Plan: [name] is here for an colonoscopy to be performed for rectal bleeding. Risks, benefits, limitations, and alternatives regarding  colonoscopy have been reviewed with the patient.  Questions have been answered.  All parties agreeable.   Jonathon Bellows, MD  06/01/2019, 8:38 AM

## 2019-06-02 ENCOUNTER — Encounter: Payer: Self-pay | Admitting: Gastroenterology

## 2019-06-02 LAB — SURGICAL PATHOLOGY

## 2019-06-08 ENCOUNTER — Telehealth: Payer: Self-pay | Admitting: Gastroenterology

## 2019-06-08 NOTE — Telephone Encounter (Signed)
Pt is calling  To check if her results are back

## 2019-06-08 NOTE — Telephone Encounter (Signed)
Patient called & l/m on v/m checking on her results from a biopsy she had done.

## 2019-06-09 ENCOUNTER — Telehealth: Payer: Self-pay | Admitting: Gastroenterology

## 2019-06-09 NOTE — Telephone Encounter (Signed)
Spoke with pt and explained that Dr. Vicente Males has not reviewed the biopsy results yet. I explained that I will call back once Dr. Vicente Males reviews and gives his recommendations. Pt understands.

## 2019-06-09 NOTE — Telephone Encounter (Signed)
Pt is calling for Biopsy results she is anxious to receive them

## 2019-06-10 ENCOUNTER — Encounter: Payer: Self-pay | Admitting: Gastroenterology

## 2019-06-10 NOTE — Telephone Encounter (Signed)
Called pt to inform her of biopsy results.  Unable to contact, LVM to return call

## 2019-06-10 NOTE — Telephone Encounter (Signed)
Spoke with pt and informed her of biopsy results and Dr. Georgeann Oppenheim recommendations to repeat colonoscopy in 3 years. Pt understands and agrees.

## 2019-07-07 ENCOUNTER — Ambulatory Visit: Payer: Self-pay | Admitting: Gastroenterology

## 2019-07-19 ENCOUNTER — Other Ambulatory Visit: Payer: Self-pay

## 2019-07-19 ENCOUNTER — Encounter: Payer: Self-pay | Admitting: Emergency Medicine

## 2019-07-19 DIAGNOSIS — K6289 Other specified diseases of anus and rectum: Secondary | ICD-10-CM | POA: Insufficient documentation

## 2019-07-19 DIAGNOSIS — E119 Type 2 diabetes mellitus without complications: Secondary | ICD-10-CM | POA: Insufficient documentation

## 2019-07-19 DIAGNOSIS — K641 Second degree hemorrhoids: Secondary | ICD-10-CM | POA: Insufficient documentation

## 2019-07-19 DIAGNOSIS — F17210 Nicotine dependence, cigarettes, uncomplicated: Secondary | ICD-10-CM | POA: Diagnosis not present

## 2019-07-19 DIAGNOSIS — K649 Unspecified hemorrhoids: Secondary | ICD-10-CM | POA: Diagnosis present

## 2019-07-19 NOTE — ED Triage Notes (Signed)
Pt reports she has constipation and today was straining to have BM. Pt now c/o protrusion of tissue from the anus and pain in same area.

## 2019-07-20 ENCOUNTER — Emergency Department
Admission: EM | Admit: 2019-07-20 | Discharge: 2019-07-20 | Disposition: A | Discharge status: Home / Self Care | Payer: 59 | Attending: Emergency Medicine | Admitting: Emergency Medicine

## 2019-07-20 ENCOUNTER — Telehealth: Payer: Self-pay | Admitting: General Surgery

## 2019-07-20 DIAGNOSIS — K641 Second degree hemorrhoids: Secondary | ICD-10-CM

## 2019-07-20 MED ORDER — LIDOCAINE HCL URETHRAL/MUCOSAL 2 % EX GEL
1.0000 "application " | Freq: Once | CUTANEOUS | Status: AC
  Administered 2019-07-20: 1 via TOPICAL
  Filled 2019-07-20: qty 10

## 2019-07-20 MED ORDER — PRAMOXINE-HC 1-1 % EX FOAM
1.0000 | Freq: Two times a day (BID) | CUTANEOUS | Status: DC
  Administered 2019-07-20: 1 via TOPICAL
  Filled 2019-07-20: qty 10

## 2019-07-20 NOTE — Telephone Encounter (Signed)
Outbound call made to the patient @ [phone], in an attempt to schedule a follow up appointment from her recent ED visit @ [facility] for hemorrhoids.  A message was left requesting a call back to follow through & schedule w/Dr. Celine Ahr.

## 2019-07-20 NOTE — Discharge Instructions (Addendum)
1.  Use Proctofoam as needed for hemorrhoid pain. 2.  Soak in warm sitz bath 2-3 times daily. 3.  Cleanse yourself using wet wipes or water. 4.  Take a daily over-the-counter stool softener such as Colace. 5.  Return to the ER for worsening symptoms, persistent vomiting, difficulty breathing or other concerns.

## 2019-07-20 NOTE — ED Provider Notes (Signed)
[facility] Emergency Department Provider Note   ____________________________________________   First MD Initiated Contact with Patient 07/20/19 [phone]     (approximate)  I have reviewed the triage vital signs and the nursing notes.   HISTORY  Chief Complaint Hemorrhoids    HPI [name] is a 49 y.o. female who presents to the ED from home with a chief complaint of hemorrhoid pain.  Patient reports she has had constipation and today was straining to have a bowel movement and now experiencing hemorrhoid with pain.  Denies abdominal pain, nausea, vomiting or rectal bleeding.       Past Medical History:  Diagnosis Date  . Back pain   . Cancer (HCC)    + CANCER CELLS TO UTERUS  . Diabetes mellitus without complication ([place])   . Family history of adverse reaction to anesthesia    FATHER STOPS BREATHING WITH ANESTHESIA  . Headache    HX MIGRAINES  . 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 Degenerative disc disease, lumbar 08/24/2015    Past Surgical History:  Procedure Laterality Date  . ABDOMINAL HYSTERECTOMY    . APPENDECTOMY    . BACK SURGERY    . COLONOSCOPY WITH PROPOFOL N/A 06/01/2019   Procedure: COLONOSCOPY WITH PROPOFOL;  Surgeon: Jonathon Bellows, MD;  Location: [facility] ENDOSCOPY;  Service: Gastroenterology;  Laterality: N/A;    Prior to Admission medications   Medication Sig Start Date End Date Taking? Authorizing Provider  acetaminophen (TYLENOL) 325 MG tablet Take 650 mg by mouth every 6 (six) hours as needed for mild pain.    [provider]  azithromycin (ZITHROMAX Z-PAK) 250 MG tablet 1 tab by mouth daily Patient not taking: Reported on 05/21/2019 06/08/16   Delman Kitten, MD  cyclobenzaprine (FLEXERIL) 10 MG tablet Take 1 tablet (10 mg total) by mouth 3 (three) times daily as needed. Patient not taking: Reported on 05/21/2019 04/27/16   [name], MD    cyclobenzaprine (FLEXERIL) 10 MG tablet Take 1 tablet (10 mg total) by mouth 3 (three) times daily as needed. Patient not taking: Reported on 05/21/2019 06/06/17   [name], MD  ibuprofen (ADVIL,MOTRIN) 600 MG tablet Take 1 tablet (600 mg total) by mouth every 6 (six) hours as needed. Patient not taking: Reported on 05/21/2019 11/10/16   Rudene Re, MD  lidocaine (LIDODERM) 5 % Place 1 patch onto the skin daily. Remove & Discard patch within 12 hours or as directed by MD Patient not taking: Reported on 05/21/2019 06/06/17   [name], MD  methocarbamol (ROBAXIN) 500 MG tablet Take 1 tablet (500 mg total) by mouth 4 (four) times daily. Patient not taking: Reported on 05/21/2019 11/10/16   Rudene Re, MD  ondansetron (ZOFRAN) 8 MG tablet Take 1 tablet (8 mg total) by mouth every 8 (eight) hours as needed for nausea or vomiting. Patient not taking: Reported on 05/21/2019 09/15/17   Sable Feil, PA-C  oxyCODONE-acetaminophen (PERCOCET) 7.5-325 MG tablet Take 1 tablet by mouth every 6 (six) hours as needed. Patient not taking: Reported on 05/21/2019 05/09/18   Sable Feil, PA-C  oxyCODONE-acetaminophen (ROXICET) 5-325 MG tablet Take 1 tablet by mouth every 4 (four) hours as needed for severe pain. Patient not taking: Reported on 05/21/2019 02/19/17   [name], MD    Allergies Chlorhexidine, Demerol [meperidine], Morphine and related, and Sulfur  Family  History  Problem Relation Age of Onset  . Breast cancer Maternal Grandmother 44  . Breast cancer Paternal Grandfather 60    Social History Social History   Tobacco Use  . Smoking status: Current Every Day Smoker    Packs/day: 1.00    Years: 28.00    Pack years: 28.00    Types: Cigarettes  . Smokeless tobacco: Never Used  Substance Use Topics  . Alcohol use: No    Alcohol/week: 0.0 standard drinks  . Drug use: No    Review of Systems  Constitutional: No fever/chills Eyes: No visual  changes. ENT: No sore throat. Cardiovascular: Denies chest pain. Respiratory: Denies shortness of breath. Gastrointestinal: Positive for hemorrhoid pain.  No abdominal pain.  No nausea, no vomiting.  No diarrhea.  No constipation. Genitourinary: Negative for dysuria. Musculoskeletal: Negative for back pain. Skin: Negative for rash. Neurological: Negative for headaches, focal weakness or numbness.   ____________________________________________   PHYSICAL EXAM:  VITAL SIGNS: ED Triage Vitals [07/19/19 2222]  Enc Vitals Group     BP (!) 151/90     Pulse Rate 92     Resp 17     Temp 98.3 F (36.8 C)     Temp Source Oral     SpO2 100 %     Weight      Height      Head Circumference      Peak Flow      Pain Score      Pain Loc      Pain Edu?      Excl. in [place]?     Constitutional: Alert and oriented. Well appearing and in no acute distress. Eyes: Conjunctivae are normal. PERRL. EOMI. Head: Atraumatic. Nose: No congestion/rhinnorhea. Mouth/Throat: Mucous membranes are moist.  Oropharynx non-erythematous. Neck: No stridor.   Cardiovascular: Normal rate, regular rhythm. Grossly normal heart sounds.  Good peripheral circulation. Respiratory: Normal respiratory effort.  No retractions. Lungs CTAB. Gastrointestinal: Soft and nontender to light and deep palpation. No distention. No abdominal bruits. No CVA tenderness. Rectal: External exam reveals grade 2 nonthrombosed, nonbleeding hemorrhoid which is easily reducible. Musculoskeletal: No lower extremity tenderness nor edema.  No joint effusions. Neurologic:  Normal speech and language. No gross focal neurologic deficits are appreciated. No gait instability. Skin:  Skin is warm, dry and intact. No rash noted. Psychiatric: Mood and affect are normal. Speech and behavior are normal.  ____________________________________________   LABS (all labs ordered are listed, but only abnormal results are displayed)  Labs Reviewed - No  data to display ____________________________________________  EKG  None ____________________________________________  RADIOLOGY  ED MD interpretation: None  Official radiology report(s): No results found.  ____________________________________________   PROCEDURES  Procedure(s) performed (including Critical Care):  Procedures   ____________________________________________   INITIAL IMPRESSION / ASSESSMENT AND PLAN / ED COURSE  As part of my medical decision making, I reviewed the following data within the [place] notes reviewed and incorporated, Old chart reviewed and Notes from prior ED visits     [name] was evaluated in Emergency Department on 07/20/2019 for the symptoms described in the history of present illness. She was evaluated in the context of the global COVID-19 pandemic, which necessitated consideration that the patient might be at risk for infection with the SARS-CoV-2 virus that causes COVID-19. Institutional protocols and algorithms that pertain to the evaluation of patients at risk for COVID-19 are in a state of rapid change based on information released by regulatory  bodies including the CDC and federal and state organizations. These policies and algorithms were followed during the patient's care in the ED.    49 year old female who presents with hemorrhoidal pain secondary to straining with bowel movements.  Lidocaine jelly and Proctofoam applied.  Patient will follow up as needed with general surgery.  Strict return precautions given.  Patient verbalizes understanding agrees with plan of care.      ____________________________________________   FINAL CLINICAL IMPRESSION(S) / ED DIAGNOSES  Final diagnoses:  Grade II hemorrhoids     ED Discharge Orders    None       Note:  This document was prepared using Dragon voice recognition software and may include unintentional dictation errors.   Paulette Blanch,  MD 07/20/19 [phone]

## 2019-07-27 ENCOUNTER — Other Ambulatory Visit: Payer: Self-pay

## 2019-07-27 ENCOUNTER — Ambulatory Visit (INDEPENDENT_AMBULATORY_CARE_PROVIDER_SITE_OTHER): Payer: 59 | Admitting: General Surgery

## 2019-07-27 ENCOUNTER — Encounter: Payer: Self-pay | Admitting: General Surgery

## 2019-07-27 VITALS — BP 107/74 | HR 94 | Temp 97.7°F | Resp 12 | Ht 64.0 in | Wt 156.8 lb

## 2019-07-27 DIAGNOSIS — K640 First degree hemorrhoids: Secondary | ICD-10-CM | POA: Diagnosis not present

## 2019-07-27 MED ORDER — HYDROCORTISONE ACETATE 25 MG RE SUPP: 25.0000 mg | Freq: Two times a day (BID) | RECTAL | 0 refills | Status: DC

## 2019-07-27 NOTE — Patient Instructions (Addendum)
Dr.Cannon discussed with patient to try the over the toilet Sitz Bath three to four times a day and/or after bowel movements to help with Hemorrhoids.  Patient is also advised to try over the counter Preparation H and Tucks Pads. Patient was prescribed Anusol medication at today's visit.    Hemorrhoids Hemorrhoids are swollen veins in and around the rectum or anus. There are two types of hemorrhoids:  Internal hemorrhoids. These occur in the veins that are just inside the rectum. They may poke through to the outside and become irritated and painful.  External hemorrhoids. These occur in the veins that are outside the anus and can be felt as a painful swelling or hard lump near the anus. Most hemorrhoids do not cause serious problems, and they can be managed with home treatments such as diet and lifestyle changes. If home treatments do not help the symptoms, procedures can be done to shrink or remove the hemorrhoids. What are the causes? This condition is caused by increased pressure in the anal area. This pressure may result from various things, including:  Constipation.  Straining to have a bowel movement.  Diarrhea.  Pregnancy.  Obesity.  Sitting for long periods of time.  Heavy lifting or other activity that causes you to strain.  Anal sex.  Riding a bike for a long period of time. What are the signs or symptoms? Symptoms of this condition include:  Pain.  Anal itching or irritation.  Rectal bleeding.  Leakage of stool (feces).  Anal swelling.  One or more lumps around the anus. How is this diagnosed? This condition can often be diagnosed through a visual exam. Other exams or tests may also be done, such as:  An exam that involves feeling the rectal area with a gloved hand (digital rectal exam).  An exam of the anal canal that is done using a small tube (anoscope).  A blood test, if you have lost a significant amount of blood.  A test to look inside the colon  using a flexible tube with a camera on the end (sigmoidoscopy or colonoscopy). How is this treated? This condition can usually be treated at home. However, various procedures may be done if dietary changes, lifestyle changes, and other home treatments do not help your symptoms. These procedures can help make the hemorrhoids smaller or remove them completely. Some of these procedures involve surgery, and others do not. Common procedures include:  Rubber band ligation. Rubber bands are placed at the base of the hemorrhoids to cut off their blood supply.  Sclerotherapy. Medicine is injected into the hemorrhoids to shrink them.  Infrared coagulation. A type of light energy is used to get rid of the hemorrhoids.  Hemorrhoidectomy surgery. The hemorrhoids are surgically removed, and the veins that supply them are tied off.  Stapled hemorrhoidopexy surgery. The surgeon staples the base of the hemorrhoid to the rectal wall. Follow these instructions at home: Eating and drinking   Eat foods that have a lot of fiber in them, such as whole grains, beans, nuts, fruits, and vegetables.  Ask your health care provider about taking products that have added fiber (fiber supplements).  Reduce the amount of fat in your diet. You can do this by eating low-fat dairy products, eating less red meat, and avoiding processed foods.  Drink enough fluid to keep your urine pale yellow. Managing pain and swelling   Take warm sitz baths for 20 minutes, 3-4 times a day to ease pain and discomfort. You may do  this in a bathtub or using a portable sitz bath that fits over the toilet.  If directed, apply ice to the affected area. Using ice packs between sitz baths may be helpful. ? Put ice in a plastic bag. ? Place a towel between your skin and the bag. ? Leave the ice on for 20 minutes, 2-3 times a day. General instructions  Take over-the-counter and prescription medicines only as told by your health care  provider.  Use medicated creams or suppositories as told.  Get regular exercise. Ask your health care provider how much and what kind of exercise is best for you. In general, you should do moderate exercise for at least 30 minutes on most days of the week (150 minutes each week). This can include activities such as walking, biking, or yoga.  Go to the bathroom when you have the urge to have a bowel movement. Do not wait.  Avoid straining to have bowel movements.  Keep the anal area dry and clean. Use wet toilet paper or moist towelettes after a bowel movement.  Do not sit on the toilet for long periods of time. This increases blood pooling and pain.  Keep all follow-up visits as told by your health care provider. This is important. Contact a health care provider if you have:  Increasing pain and swelling that are not controlled by treatment or medicine.  Difficulty having a bowel movement, or you are unable to have a bowel movement.  Pain or inflammation outside the area of the hemorrhoids. Get help right away if you have:  Uncontrolled bleeding from your rectum. Summary  Hemorrhoids are swollen veins in and around the rectum or anus.  Most hemorrhoids can be managed with home treatments such as diet and lifestyle changes.  Taking warm sitz baths can help ease pain and discomfort.  In severe cases, procedures or surgery can be done to shrink or remove the hemorrhoids. This information is not intended to replace advice given to you by your health care provider. Make sure you discuss any questions you have with your health care provider. Document Revised: 11/06/2018 Document Reviewed: 10/30/2017 Elsevier Patient Education  [place].  How to Take a [employer] A sitz bath is a warm water bath that may be used to care for your rectum, genital area, or the area between your rectum and genitals (perineum). For a sitz bath, the water only comes up to your hips and covers your  buttocks. A sitz bath may done at home in a bathtub or with a portable sitz bath that fits over the toilet. Your health care provider may recommend a sitz bath to help:  Relieve pain and discomfort after delivering a baby.  Relieve pain and itching from hemorrhoids or anal fissures.  Relieve pain after certain surgeries.  Relax muscles that are sore or tight. How to take a sitz bath Take 3-4 sitz baths a day, or as many as told by your health care provider. Bathtub sitz bath To take a sitz bath in a bathtub: 1. Partially fill a bathtub with warm water. The water should be deep enough to cover your hips and buttocks when you are sitting in the tub. 2. If your health care provider told you to put medicine in the water, follow his or her instructions. 3. Sit in the water. 4. Open the tub drain a little, and leave it open during your bath. 5. Turn on the warm water again, enough to replace the water that  is draining out. Keep the water running throughout your bath. This helps keep the water at the right level and the right temperature. 6. Soak in the water for 15-20 minutes, or as long as told by your health care provider. 7. When you are done, be careful when you stand up. You may feel dizzy. 8. After the sitz bath, pat yourself dry. Do not rub your skin to dry it.  Over-the-toilet sitz bath To take a sitz bath with an over-the-toilet basin: 1. Follow the manufacturer's instructions. 2. Fill the basin with warm water. 3. If your health care provider told you to put medicine in the water, follow his or her instructions. 4. Sit on the seat. Make sure the water covers your buttocks and perineum. 5. Soak in the water for 15-20 minutes, or as long as told by your health care provider. 6. After the sitz bath, pat yourself dry. Do not rub your skin to dry it. 7. Clean and dry the basin between uses. 8. Discard the basin if it cracks, or according to the manufacturer's instructions. Contact a  health care provider if:  Your symptoms get worse. Do not continue with sitz baths if your symptoms get worse.  You have new symptoms. If this happens, do not continue with sitz baths until you talk with your health care provider. Summary  A sitz bath is a warm water bath in which the water only comes up to your hips and covers your buttocks.  A sitz bath may help relieve itching, relieve pain, and relax muscles that are sore or tight in the lower part of your body, including your genital area.  Take 3-4 sitz baths a day, or as many as told by your health care provider. Soak in the water for 15-20 minutes.  Do not continue with sitz baths if your symptoms get worse. This information is not intended to replace advice given to you by your health care provider. Make sure you discuss any questions you have with your health care provider. Document Revised: 11/09/2018 Document Reviewed: 06/12/2017 Elsevier Patient Education  [place].

## 2019-07-27 NOTE — Progress Notes (Signed)
Patient ID: [name], female   DOB: [birth year], 48 y.o.   MRN: [id number]  Chief Complaint  Patient presents with  . New Patient (Initial Visit)    new pt seen in ER hemorrhoids    HPI [name] is a 49 y.o. female.  She was seen in the emergency department on 20 July 2019 secondary to bleeding hemorrhoids.  She states that she has had hemorrhoids since the birth of a child in 10.  They seem to have become worse over time.  She does give a history of a prior thrombosed hemorrhoid.  When she was seen in the emergency department, no thrombosis was appreciated.  She was given lidocaine jelly and Proctofoam and advised to follow-up with general surgery.  She did bring a photograph of the small blood clot that she had passed in 2023/06/23.  Of note, she has a history of colon polyps and had a colonoscopy on 01 June 2019.  Several polyps were removed at that time.  She endorses significant constipation and has recently started taking fiber supplements.  She has not noticed any blood in her stools aside from the small clot that she passed prior to coming to the emergency department.  Her primary symptom has been itching.  She has not attempted any over-the-counter remedies, nor is she performing sitz baths.   Past Medical History:  Diagnosis Date  . Back pain   . Cancer (HCC)    + CANCER CELLS TO UTERUS  . Diabetes mellitus without complication ([place])   . Family history of adverse reaction to anesthesia    FATHER STOPS BREATHING WITH ANESTHESIA  . Headache    HX MIGRAINES  . Seizures ([place])    1 SEIZURE  VERY STRESSED    Past Surgical History:  Procedure Laterality Date  . ABDOMINAL HYSTERECTOMY    . APPENDECTOMY    . BACK SURGERY    . COLONOSCOPY WITH PROPOFOL N/A 06/01/2019   Procedure: COLONOSCOPY WITH PROPOFOL;  Surgeon: Jonathon Bellows, MD;  Location: [facility] ENDOSCOPY;  Service: Gastroenterology;  Laterality: N/A;    Family History  Problem Relation Age of Onset  . Breast  cancer Maternal Grandmother 88  . Breast cancer Paternal Grandfather 54    Social History Social History   Tobacco Use  . Smoking status: Current Every Day Smoker    Packs/day: 1.00    Years: 28.00    Pack years: 28.00    Types: Cigarettes  . Smokeless tobacco: Never Used  Substance Use Topics  . Alcohol use: No    Alcohol/week: 0.0 standard drinks  . Drug use: No    Allergies  Allergen Reactions  . Chlorhexidine Itching  . Demerol [Meperidine] Rash  . Morphine And Related Hives and Rash  . Sulfur Rash    Current Outpatient Medications  Medication Sig Dispense Refill  . acetaminophen (TYLENOL) 325 MG tablet Take 650 mg by mouth every 6 (six) hours as needed for mild pain.    Marland Kitchen ibuprofen (ADVIL,MOTRIN) 600 MG tablet Take 1 tablet (600 mg total) by mouth every 6 (six) hours as needed. 20 tablet 0  . azithromycin (ZITHROMAX Z-PAK) 250 MG tablet 1 tab by mouth daily (Patient not taking: Reported on 05/21/2019) 4 each 0  . cyclobenzaprine (FLEXERIL) 10 MG tablet Take 1 tablet (10 mg total) by mouth 3 (three) times daily as needed. (Patient not taking: Reported on 05/21/2019) 30 tablet 0  . cyclobenzaprine (FLEXERIL) 10 MG tablet Take 1 tablet (10 mg total)  by mouth 3 (three) times daily as needed. (Patient not taking: Reported on 05/21/2019) 30 tablet 0  . hydrocortisone (ANUSOL-HC) 25 MG suppository Place 1 suppository (25 mg total) rectally 2 (two) times daily. 12 suppository 0  . lidocaine (LIDODERM) 5 % Place 1 patch onto the skin daily. Remove & Discard patch within 12 hours or as directed by MD (Patient not taking: Reported on 05/21/2019) 30 patch 0  . methocarbamol (ROBAXIN) 500 MG tablet Take 1 tablet (500 mg total) by mouth 4 (four) times daily. (Patient not taking: Reported on 05/21/2019) 20 tablet 0  . ondansetron (ZOFRAN) 8 MG tablet Take 1 tablet (8 mg total) by mouth every 8 (eight) hours as needed for nausea or vomiting. (Patient not taking: Reported on 05/21/2019) 20  tablet 0  . oxyCODONE-acetaminophen (PERCOCET) 7.5-325 MG tablet Take 1 tablet by mouth every 6 (six) hours as needed. (Patient not taking: Reported on 05/21/2019) 20 tablet 0  . oxyCODONE-acetaminophen (ROXICET) 5-325 MG tablet Take 1 tablet by mouth every 4 (four) hours as needed for severe pain. (Patient not taking: Reported on 05/21/2019) 20 tablet 0   No current facility-administered medications for this visit.    Review of Systems Review of Systems  Gastrointestinal: Positive for rectal pain.  Musculoskeletal: Positive for back pain.  All other systems reviewed and are negative. Or as discussed in the history of present illness.  Blood pressure 107/74, pulse 94, temperature 97.7 F (36.5 C), temperature source Temporal, resp. rate 12, height 5\' 4"  (1.626 m), weight 156 lb 12.8 oz (71.1 kg), SpO2 97 %. Body mass index is 26.91 kg/m.  Physical Exam Physical Exam Vitals reviewed. Exam conducted with a chaperone present.  Constitutional:      General: She is not in acute distress.    Appearance: Normal appearance. She is normal weight.  HENT:     Head: Normocephalic and atraumatic.     Nose:     Comments: Covered with a mask secondary to COVID-19 precautions    Mouth/Throat:     Comments: Covered with a mask secondary to COVID-19 precautions Eyes:     General:        Right eye: No discharge.        Left eye: No discharge.     Comments: Xanthelasma present  Cardiovascular:     Rate and Rhythm: Normal rate and regular rhythm.     Pulses: Normal pulses.  Pulmonary:     Effort: Pulmonary effort is normal. No respiratory distress.     Breath sounds: Normal breath sounds.  Abdominal:     General: Bowel sounds are normal.     Palpations: Abdomen is soft.     Tenderness: There is no abdominal tenderness.  Genitourinary:    Exam position: Knee-chest position.       Comments: There were 2 small anal skin tags present with grade 1 hemorrhoids.  Digital rectal exam did not  reveal any gross blood nor any other palpable abnormalities. Musculoskeletal:     Cervical back: Normal range of motion. No rigidity.     Comments: Trace ankle edema present.  Lymphadenopathy:     Cervical: No cervical adenopathy.  Skin:    General: Skin is warm and dry.  Neurological:     General: No focal deficit present.     Mental Status: She is alert and oriented to person, place, and time.  Psychiatric:        Mood and Affect: Mood normal.  Behavior: Behavior normal.     Data Reviewed I reviewed the report and images from her colonoscopy that was performed on 06/01/2019.  There were several small polyps removed at that time.  The pathology from all of them was benign.  I also reviewed the emergency department notes from her visit on 07/20/2019. On the emergency department visit physical exam, they noted a prolapsed hemorrhoid that was able to be reduced.  I did not appreciate this on my exam today.  Assessment This is a 49 year old woman who came to the emergency department on January 26 with complaints of hemorrhoidal pain.  She was provided with lidocaine jelly and Proctofoam, but she states that she is not really using this.  She has not attempted any over-the-counter remedies nor is she performing sitz bath's.  She states that she cannot sit in a tub secondary to her back issues which make it difficult for her to get out of the tub.    Plan Today, I discussed the use of a toilet seat type of sitz bath and encouraged her to use this 3-4 times daily as well as after every bowel movement.  She may wish to try using moist wipes as opposed to toilet paper to the irritation of her perianal skin.  She should continue to use the lidocaine jelly for symptomatic relief.  We prescribed Anusol suppositories which she may use on a short-term basis.  She may also use over-the-counter Preparation H suppositories or topical ointment as well as Tucks pads for symptoms.  At this time, I do not  think her hemorrhoids are severe enough to warrant surgical intervention.  I think she will likely respond well to conservative measures.  She will contact us if she continues to have ongoing issues.    Fredirick Maudlin 07/27/2019, 10:59 AM

## 2019-08-17 ENCOUNTER — Encounter: Payer: Self-pay | Admitting: Emergency Medicine

## 2019-08-17 ENCOUNTER — Emergency Department
Admission: EM | Admit: 2019-08-17 | Discharge: 2019-08-17 | Disposition: A | Discharge status: Home / Self Care | Payer: 59 | Attending: Emergency Medicine | Admitting: Emergency Medicine

## 2019-08-17 ENCOUNTER — Other Ambulatory Visit: Payer: Self-pay

## 2019-08-17 ENCOUNTER — Emergency Department: Payer: 59

## 2019-08-17 DIAGNOSIS — R079 Chest pain, unspecified: Secondary | ICD-10-CM

## 2019-08-17 DIAGNOSIS — R0789 Other chest pain: Secondary | ICD-10-CM | POA: Insufficient documentation

## 2019-08-17 DIAGNOSIS — Z79899 Other long term (current) drug therapy: Secondary | ICD-10-CM | POA: Diagnosis not present

## 2019-08-17 DIAGNOSIS — M7989 Other specified soft tissue disorders: Secondary | ICD-10-CM | POA: Diagnosis not present

## 2019-08-17 DIAGNOSIS — F17210 Nicotine dependence, cigarettes, uncomplicated: Secondary | ICD-10-CM | POA: Diagnosis not present

## 2019-08-17 DIAGNOSIS — E119 Type 2 diabetes mellitus without complications: Secondary | ICD-10-CM | POA: Insufficient documentation

## 2019-08-17 LAB — CBC WITH DIFFERENTIAL/PLATELET
Abs Immature Granulocytes: 0.03 10*3/uL (ref 0.00–0.07)
Basophils Absolute: 0.1 10*3/uL (ref 0.0–0.1)
Basophils Relative: 1 %
Eosinophils Absolute: 0.2 10*3/uL (ref 0.0–0.5)
Eosinophils Relative: 2 %
HCT: 39.8 % (ref 36.0–46.0)
Hemoglobin: 13 g/dL (ref 12.0–15.0)
Immature Granulocytes: 0 %
Lymphocytes Relative: 44 %
Lymphs Abs: 4.6 10*3/uL — ABNORMAL HIGH (ref 0.7–4.0)
MCH: 31.6 pg (ref 26.0–34.0)
MCHC: 32.7 g/dL (ref 30.0–36.0)
MCV: 96.8 fL (ref 80.0–100.0)
Monocytes Absolute: 0.6 10*3/uL (ref 0.1–1.0)
Monocytes Relative: 6 %
Neutro Abs: 4.8 10*3/uL (ref 1.7–7.7)
Neutrophils Relative %: 47 %
Platelets: 286 10*3/uL (ref 150–400)
RBC: 4.11 MIL/uL (ref 3.87–5.11)
RDW: 12.8 % (ref 11.5–15.5)
WBC: 10.3 10*3/uL (ref 4.0–10.5)
nRBC: 0 % (ref 0.0–0.2)

## 2019-08-17 LAB — COMPREHENSIVE METABOLIC PANEL
ALT: 10 U/L (ref 0–44)
AST: 12 U/L — ABNORMAL LOW (ref 15–41)
Albumin: 4 g/dL (ref 3.5–5.0)
Alkaline Phosphatase: 72 U/L (ref 38–126)
Anion gap: 8 (ref 5–15)
BUN: 16 mg/dL (ref 6–20)
CO2: 25 mmol/L (ref 22–32)
Calcium: 8.3 mg/dL — ABNORMAL LOW (ref 8.9–10.3)
Chloride: 96 mmol/L — ABNORMAL LOW (ref 98–111)
Creatinine, Ser: 0.86 mg/dL (ref 0.44–1.00)
GFR calc Af Amer: >60 mL/min (ref >60)
GFR calc non Af Amer: >60 mL/min (ref >60)
Glucose, Bld: 117 mg/dL — ABNORMAL HIGH (ref 70–99)
Potassium: 4.1 mmol/L (ref 3.5–5.1)
Sodium: 129 mmol/L — ABNORMAL LOW (ref 135–145)
Total Bilirubin: 0.4 mg/dL (ref 0.3–1.2)
Total Protein: 7.5 g/dL (ref 6.5–8.1)

## 2019-08-17 LAB — TROPONIN I (HIGH SENSITIVITY)
Troponin I (High Sensitivity): 5 ng/L (ref <18)
Troponin I (High Sensitivity): 2 ng/L (ref <18)

## 2019-08-17 LAB — FIBRIN DERIVATIVES D-DIMER (ARMC ONLY): Fibrin derivatives D-dimer (ARMC): 248.57 ng/mL (FEU) (ref 0.00–499.00)

## 2019-08-17 MED ORDER — KETOROLAC TROMETHAMINE 30 MG/ML IJ SOLN
30.0000 mg | Freq: Once | INTRAMUSCULAR | Status: AC
  Administered 2019-08-17: 30 mg via INTRAVENOUS
  Filled 2019-08-17: qty 1

## 2019-08-17 MED ORDER — SODIUM CHLORIDE 0.9 % IV BOLUS
1000.0000 mL | Freq: Once | INTRAVENOUS | Status: AC
  Administered 2019-08-17: 1000 mL via INTRAVENOUS

## 2019-08-17 NOTE — ED Provider Notes (Signed)
[facility] Emergency Department Provider Note  ____________________________________________   First MD Initiated Contact with Patient 08/17/19 [phone]     (approximate)  I have reviewed the triage vital signs and the nursing notes.   HISTORY  Chief Complaint Chest Pain   HPI [name] is a 49 y.o. female with below list of previous medical conditions presents to the emergency department secondary to reproducible  left-sided chest pain that radiates to the right which patient states has been occurring for the past 3 days.  Patient describes the pain as aching in nature.  Patient also admits to worsening of the pain with deep inspiration.  Patient admits to lower extremity swelling however no pain.  Patient denies any cough no fever no known sick contact.        Past Medical History:  Diagnosis Date  . Back pain   . Cancer (HCC)    + CANCER CELLS TO UTERUS  . Diabetes mellitus without complication ([place])   . Family history of adverse reaction to anesthesia    FATHER STOPS BREATHING WITH ANESTHESIA  . Headache    HX MIGRAINES  . Seizures ([place])    1 SEIZURE  VERY STRESSED    Patient Active Problem List   Diagnosis Date Noted  . Grade I hemorrhoids 07/27/2019  . Degenerative disc disease, lumbar 08/24/2015    Past Surgical History:  Procedure Laterality Date  . ABDOMINAL HYSTERECTOMY    . APPENDECTOMY    . BACK SURGERY    . COLONOSCOPY WITH PROPOFOL N/A 06/01/2019   Procedure: COLONOSCOPY WITH PROPOFOL;  Surgeon: Jonathon Bellows, MD;  Location: [facility] ENDOSCOPY;  Service: Gastroenterology;  Laterality: N/A;    Prior to Admission medications   Medication Sig Start Date End Date Taking? Authorizing Provider  acetaminophen (TYLENOL) 325 MG tablet Take 650 mg by mouth every 6 (six) hours as needed for mild pain.    [provider]  azithromycin (ZITHROMAX Z-PAK) 250 MG tablet 1 tab by mouth daily Patient not taking: Reported on  05/21/2019 06/08/16   Delman Kitten, MD  cyclobenzaprine (FLEXERIL) 10 MG tablet Take 1 tablet (10 mg total) by mouth 3 (three) times daily as needed. Patient not taking: Reported on 05/21/2019 04/27/16   [name], MD  cyclobenzaprine (FLEXERIL) 10 MG tablet Take 1 tablet (10 mg total) by mouth 3 (three) times daily as needed. Patient not taking: Reported on 05/21/2019 06/06/17   [name], MD  hydrocortisone (ANUSOL-HC) 25 MG suppository Place 1 suppository (25 mg total) rectally 2 (two) times daily. 07/27/19   Fredirick Maudlin, MD  ibuprofen (ADVIL,MOTRIN) 600 MG tablet Take 1 tablet (600 mg total) by mouth every 6 (six) hours as needed. 11/10/16   Alfred Levins, [place], MD  lidocaine (LIDODERM) 5 % Place 1 patch onto the skin daily. Remove & Discard patch within 12 hours or as directed by MD Patient not taking: Reported on 05/21/2019 06/06/17   [name], MD  methocarbamol (ROBAXIN) 500 MG tablet Take 1 tablet (500 mg total) by mouth 4 (four) times daily. Patient not taking: Reported on 05/21/2019 11/10/16   Rudene Re, MD  ondansetron (ZOFRAN) 8 MG tablet Take 1 tablet (8 mg total) by mouth every 8 (eight) hours as needed for nausea or vomiting. Patient not taking: Reported on 05/21/2019 09/15/17   Sable Feil, PA-C  oxyCODONE-acetaminophen (PERCOCET) 7.5-325 MG tablet Take 1 tablet by mouth every 6 (six) hours as needed. Patient not taking: Reported on 05/21/2019  05/09/18   Sable Feil, PA-C  oxyCODONE-acetaminophen (ROXICET) 5-325 MG tablet Take 1 tablet by mouth every 4 (four) hours as needed for severe pain. Patient not taking: Reported on 05/21/2019 02/19/17   [name], MD    Allergies Chlorhexidine, Demerol [meperidine], Morphine and related, and Sulfur  Family History  Problem Relation Age of Onset  . Breast cancer Maternal Grandmother 68  . Breast cancer Paternal Grandfather 5    Social History Social History   Tobacco Use  . Smoking  status: Current Every Day Smoker    Packs/day: 1.00    Years: 28.00    Pack years: 28.00    Types: Cigarettes  . Smokeless tobacco: Never Used  Substance Use Topics  . Alcohol use: No    Alcohol/week: 0.0 standard drinks  . Drug use: No    Review of Systems Constitutional: No fever/chills Eyes: No visual changes. ENT: No sore throat. Cardiovascular: Positive for chest pain. Respiratory: Denies shortness of breath. Gastrointestinal: No abdominal pain.  No nausea, no vomiting.  No diarrhea.  No constipation. Genitourinary: Negative for dysuria. Musculoskeletal: Negative for neck pain.  Negative for back pain. Integumentary: Negative for rash. Neurological: Negative for headaches, focal weakness or numbness. ____________________________________________   PHYSICAL EXAM:  VITAL SIGNS: ED Triage Vitals  Enc Vitals Group     BP 08/17/19 0058 (!) 159/79     Pulse Rate 08/17/19 0058 78     Resp 08/17/19 0058 14     Temp 08/17/19 0058 98.4 F (36.9 C)     Temp Source 08/17/19 0058 Oral     SpO2 08/17/19 0058 100 %     Weight 08/17/19 0047 74.8 kg (165 lb)     Height 08/17/19 0047 1.626 m (5\' 4" )     Head Circumference --      Peak Flow --      Pain Score 08/17/19 0047 5     Pain Loc --      Pain Edu? --      Excl. in [place]? --     Constitutional: Alert and oriented.  Eyes: Conjunctivae are normal.  Mouth/Throat: Patient is wearing a mask. Neck: No stridor.  No meningeal signs.   Chest: Pain to palpation left sternal border Cardiovascular: Normal rate, regular rhythm. Good peripheral circulation. Grossly normal heart sounds. Respiratory: Normal respiratory effort.  No retractions. Gastrointestinal: Soft and nontender. No distention.  Musculoskeletal: No lower extremity tenderness nor edema. No gross deformities of extremities. Neurologic:  Normal speech and language. No gross focal neurologic deficits are appreciated.  Skin:  Skin is warm, dry and intact. Psychiatric:  Mood and affect are normal. Speech and behavior are normal.  ____________________________________________   LABS (all labs ordered are listed, but only abnormal results are displayed)  Labs Reviewed  CBC WITH DIFFERENTIAL/PLATELET - Abnormal; Notable for the following components:      Result Value   Lymphs Abs 4.6 (*)    All other components within normal limits  COMPREHENSIVE METABOLIC PANEL - Abnormal; Notable for the following components:   Sodium 129 (*)    Chloride 96 (*)    Glucose, Bld 117 (*)    Calcium 8.3 (*)    AST 12 (*)    All other components within normal limits  TROPONIN I (HIGH SENSITIVITY)   ____________________________________________  EKG  ED ECG REPORT I, [place] N Akil Hoos, the attending physician, personally viewed and interpreted this ECG.   Date: 08/17/2019  EKG Time: 12:56 AM  Rate:  78  Rhythm: Normal sinus rhythm  Axis: Normal  Intervals: Normal  ST&T Change: None  ____________________________________________  RADIOLOGY I, [place] Jordann Grime, personally viewed and evaluated these images (plain radiographs) as part of my medical decision making, as well as reviewing the written report by the radiologist.  ED MD interpretation: Chronic interstitial lung disease consistent with patient's tobacco use per radiologist.  Official radiology report(s): DG Chest 2 View  Result Date: 08/17/2019 CLINICAL DATA:  Left-sided chest pain radiating to right chest, headache, symptoms for 3 days, extensive tobacco abuse EXAM: CHEST - 2 VIEW COMPARISON:  06/08/2016 FINDINGS: Frontal and lateral views of the chest demonstrate an unremarkable cardiac silhouette. Background parenchymal lung scarring consistent with history of tobacco abuse. No airspace disease, effusion, or pneumothorax. No acute bony abnormalities. IMPRESSION: 1. Chronic interstitial lung disease consistent with history of tobacco abuse. 2. No acute airspace disease. Electronically Signed   By: Randa Ngo M.D.   On: 08/17/2019 01:22     Procedures   ____________________________________________   INITIAL IMPRESSION / MDM / [place] / ED COURSE  As part of my medical decision making, I reviewed the following data within the electronic MEDICAL RECORD NUMBER   50 year old female presented with above-stated history and physical exam with differential diagnosis including but not limited to costochondritis versus other chest wall etiology of pain.  However did consider the possibility of ACS EKG revealed no evidence of ischemia or infarction.  Laboratory data including high-sensitivity troponin x2 -.  Considered possibly of pulmonary emboli in a low risk patient as such D-dimer was employed which was negative.  Patient will be discharged home with outpatient follow-up.      ____________________________________________  FINAL CLINICAL IMPRESSION(S) / ED DIAGNOSES  Final diagnoses:  Nonspecific chest pain  Chest wall pain     MEDICATIONS GIVEN DURING THIS VISIT:  Medications - No data to display   ED Discharge Orders    None      *Please note:  Nicolee Rayborn was evaluated in Emergency Department on 08/17/2019 for the symptoms described in the history of present illness. She was evaluated in the context of the global COVID-19 pandemic, which necessitated consideration that the patient might be at risk for infection with the SARS-CoV-2 virus that causes COVID-19. Institutional protocols and algorithms that pertain to the evaluation of patients at risk for COVID-19 are in a state of rapid change based on information released by regulatory bodies including the CDC and federal and state organizations. These policies and algorithms were followed during the patient's care in the ED.  Some ED evaluations and interventions may be delayed as a result of limited staffing during the pandemic.*  Note:  This document was prepared using Dragon voice recognition software and may  include unintentional dictation errors.   [name], MD 08/17/19 [phone]

## 2019-08-17 NOTE — ED Notes (Signed)
Resumed care of patient at this time. Pt placed on CCM with call bell within reach. Pt with no other needs at this time.

## 2019-08-17 NOTE — ED Triage Notes (Signed)
Patient ambulatory to triage with steady gait, without difficulty or distress noted, mask in place; pt reports left sided CP radiating over to rt side accomp by HA x 3 days

## 2019-08-17 NOTE — ED Notes (Signed)
Pt states left side CP that started three days ago that radiates down the right side. CP is reproducible at this time. Pt also endorses swelling in bilateral ankles at this time.

## 2019-09-10 DIAGNOSIS — I700 Atherosclerosis of aorta: Secondary | ICD-10-CM | POA: Insufficient documentation

## 2020-01-28 ENCOUNTER — Emergency Department: Payer: BLUE CROSS/BLUE SHIELD

## 2020-01-28 ENCOUNTER — Emergency Department
Admission: EM | Admit: 2020-01-28 | Discharge: 2020-01-28 | Disposition: A | Discharge status: Home / Self Care | Payer: BLUE CROSS/BLUE SHIELD | Attending: Emergency Medicine | Admitting: Emergency Medicine

## 2020-01-28 ENCOUNTER — Other Ambulatory Visit: Payer: Self-pay

## 2020-01-28 DIAGNOSIS — R0602 Shortness of breath: Secondary | ICD-10-CM | POA: Insufficient documentation

## 2020-01-28 DIAGNOSIS — Z5321 Procedure and treatment not carried out due to patient leaving prior to being seen by health care provider: Secondary | ICD-10-CM | POA: Diagnosis not present

## 2020-01-28 DIAGNOSIS — R0789 Other chest pain: Secondary | ICD-10-CM | POA: Insufficient documentation

## 2020-01-28 LAB — CBC
HCT: 40.8 % (ref 36.0–46.0)
Hemoglobin: 14.2 g/dL (ref 12.0–15.0)
MCH: 32.5 pg (ref 26.0–34.0)
MCHC: 34.8 g/dL (ref 30.0–36.0)
MCV: 93.4 fL (ref 80.0–100.0)
Platelets: 346 10*3/uL (ref 150–400)
RBC: 4.37 MIL/uL (ref 3.87–5.11)
RDW: 13 % (ref 11.5–15.5)
WBC: 11.5 10*3/uL — ABNORMAL HIGH (ref 4.0–10.5)
nRBC: 0 % (ref 0.0–0.2)

## 2020-01-28 LAB — BASIC METABOLIC PANEL
Anion gap: 10 (ref 5–15)
BUN: 12 mg/dL (ref 6–20)
CO2: 24 mmol/L (ref 22–32)
Calcium: 9.3 mg/dL (ref 8.9–10.3)
Chloride: 104 mmol/L (ref 98–111)
Creatinine, Ser: 0.76 mg/dL (ref 0.44–1.00)
GFR calc Af Amer: >60 mL/min (ref >60)
GFR calc non Af Amer: >60 mL/min (ref >60)
Glucose, Bld: 160 mg/dL — ABNORMAL HIGH (ref 70–99)
Potassium: 3.8 mmol/L (ref 3.5–5.1)
Sodium: 138 mmol/L (ref 135–145)

## 2020-01-28 LAB — TROPONIN I (HIGH SENSITIVITY): Troponin I (High Sensitivity): 3 ng/L (ref <18)

## 2020-01-28 MED ORDER — SODIUM CHLORIDE 0.9% FLUSH: 3.0000 mL | Freq: Once | INTRAVENOUS | Status: DC

## 2020-01-28 NOTE — ED Triage Notes (Signed)
Centralized CP pain, sharp for "a while", worsening today. Also reporting SOB when pain occurs. Pt alert and oriented X4, cooperative, RR even and unlabored, color WNL. Pt in NAD.

## 2020-01-28 NOTE — ED Notes (Signed)
Pt called for triage x1 

## 2020-01-28 NOTE — ED Notes (Signed)
Called no answer

## 2020-01-28 NOTE — ED Notes (Signed)
Pt called for lab redraw, cannot find pt.

## 2020-01-28 NOTE — ED Notes (Signed)
No answer, cannot find pt, did check outside.

## 2020-01-31 DIAGNOSIS — J449 Chronic obstructive pulmonary disease, unspecified: Secondary | ICD-10-CM | POA: Insufficient documentation

## 2020-02-01 ENCOUNTER — Telehealth: Payer: Self-pay | Admitting: Emergency Medicine

## 2020-02-01 NOTE — Telephone Encounter (Addendum)
Called patient due to recommendation on chest xray report done during ED visit prior to lwot.  I left a message asking her to call me.  Got in touch with patient who requested daughter be on line.  The pateint does have appt with new pcp.  I explained the results of chest xray from ED visit here.  I told her that a mammogram was recommended.  She has not had a mammogram recently.  She agrees to ask her pcp about getting a mammogram and to review the chest xray.

## 2020-02-03 ENCOUNTER — Telehealth: Payer: Self-pay

## 2020-02-03 NOTE — Telephone Encounter (Signed)
Confirmed office visit on 8/16

## 2020-02-07 ENCOUNTER — Encounter: Payer: Self-pay | Admitting: Internal Medicine

## 2020-02-07 ENCOUNTER — Other Ambulatory Visit: Payer: Self-pay | Admitting: Internal Medicine

## 2020-02-07 ENCOUNTER — Other Ambulatory Visit: Payer: Self-pay

## 2020-02-07 ENCOUNTER — Ambulatory Visit (INDEPENDENT_AMBULATORY_CARE_PROVIDER_SITE_OTHER): Payer: BLUE CROSS/BLUE SHIELD | Admitting: Internal Medicine

## 2020-02-07 DIAGNOSIS — E782 Mixed hyperlipidemia: Secondary | ICD-10-CM

## 2020-02-07 DIAGNOSIS — E1165 Type 2 diabetes mellitus with hyperglycemia: Secondary | ICD-10-CM

## 2020-02-07 DIAGNOSIS — N644 Mastodynia: Secondary | ICD-10-CM

## 2020-02-07 DIAGNOSIS — F17200 Nicotine dependence, unspecified, uncomplicated: Secondary | ICD-10-CM

## 2020-02-07 DIAGNOSIS — R0602 Shortness of breath: Secondary | ICD-10-CM

## 2020-02-07 DIAGNOSIS — R0782 Intercostal pain: Secondary | ICD-10-CM | POA: Diagnosis not present

## 2020-02-07 DIAGNOSIS — Z8639 Personal history of other endocrine, nutritional and metabolic disease: Secondary | ICD-10-CM

## 2020-02-07 DIAGNOSIS — Z1231 Encounter for screening mammogram for malignant neoplasm of breast: Secondary | ICD-10-CM | POA: Diagnosis not present

## 2020-02-07 DIAGNOSIS — F339 Major depressive disorder, recurrent, unspecified: Secondary | ICD-10-CM

## 2020-02-07 LAB — POCT GLYCOSYLATED HEMOGLOBIN (HGB A1C): Hemoglobin A1C: 6.2 % — AB (ref 4.0–5.6)

## 2020-02-07 MED ORDER — ESCITALOPRAM OXALATE 10 MG PO TABS: ORAL_TABLET | ORAL | 3 refills | Status: DC

## 2020-02-07 NOTE — Progress Notes (Signed)
[facility] [place], [place] [address]  Internal MEDICINE  Office Visit Note  Patient Name: [name]  [id number]  [id number]  Date of Service: 02/07/2020   Complaints/HPI Pt is here for establishment of PCP. Chief Complaint  Patient presents with  . New Patient (Initial Visit)    wants to discuss left breast lump, and heart and breathing problem, cholesterol  . Diabetes  . Quality Metric Gaps    HepC, HIV screening, TDAP, PAP, Mammogram   HPI Pt is here to establish her primary care. She has multiple complaints and has gone to ED most of the time due to lack of medical coverage. Went to ED with chest pain and sob, she has seen cardiology and is scheduled to have stress test. She also had a spot in her breast or lungs, CXR is not clear, she has not had a mammogram for many years. She currently a smoker but is interested in smoking cessation. She has problem sleeping, feels depressed most of the time.  denies any fever or chills. She gives a vague h/o sz disorder not taking any medicine, does not recall alst sz.   Current Medication: Outpatient Encounter Medications as of 02/07/2020  Medication Sig  . acetaminophen (TYLENOL) 325 MG tablet Take 650 mg by mouth every 6 (six) hours as needed for mild pain.  Marland Kitchen ibuprofen (ADVIL,MOTRIN) 600 MG tablet Take 1 tablet (600 mg total) by mouth every 6 (six) hours as needed.  . isosorbide mononitrate (IMDUR) 30 MG 24 hr tablet Take 81 mg by mouth once.  . cyclobenzaprine (FLEXERIL) 10 MG tablet Take 1 tablet (10 mg total) by mouth 3 (three) times daily as needed. (Patient not taking: Reported on 02/07/2020)  . cyclobenzaprine (FLEXERIL) 10 MG tablet Take 1 tablet (10 mg total) by mouth 3 (three) times daily as needed. (Patient not taking: Reported on 02/07/2020)  . escitalopram (LEXAPRO) 10 MG tablet Take one tab between 5-7 pm for anxiety  . hydrocortisone (ANUSOL-HC) 25 MG suppository Place 1 suppository (25 mg  total) rectally 2 (two) times daily. (Patient not taking: Reported on 02/07/2020)  . lidocaine (LIDODERM) 5 % Place 1 patch onto the skin daily. Remove & Discard patch within 12 hours or as directed by MD (Patient not taking: Reported on 02/07/2020)  . methocarbamol (ROBAXIN) 500 MG tablet Take 1 tablet (500 mg total) by mouth 4 (four) times daily. (Patient not taking: Reported on 02/07/2020)  . ondansetron (ZOFRAN) 8 MG tablet Take 1 tablet (8 mg total) by mouth every 8 (eight) hours as needed for nausea or vomiting. (Patient not taking: Reported on 02/07/2020)  . oxyCODONE-acetaminophen (PERCOCET) 7.5-325 MG tablet Take 1 tablet by mouth every 6 (six) hours as needed. (Patient not taking: Reported on 02/07/2020)  . oxyCODONE-acetaminophen (ROXICET) 5-325 MG tablet Take 1 tablet by mouth every 4 (four) hours as needed for severe pain. (Patient not taking: Reported on 02/07/2020)  . [DISCONTINUED] azithromycin (ZITHROMAX Z-PAK) 250 MG tablet 1 tab by mouth daily (Patient not taking: Reported on 02/07/2020)   No facility-administered encounter medications on file as of 02/07/2020.    Surgical History: Past Surgical History:  Procedure Laterality Date  . ABDOMINAL HYSTERECTOMY    . APPENDECTOMY    . BACK SURGERY    . COLONOSCOPY WITH PROPOFOL N/A 06/01/2019   Procedure: COLONOSCOPY WITH PROPOFOL;  Surgeon: Jonathon Bellows, MD;  Location: [facility] ENDOSCOPY;  Service: Gastroenterology;  Laterality: N/A;    Medical History: Past Medical History:  Diagnosis Date  . Back pain   . Cancer (HCC)    + CANCER CELLS TO UTERUS  . Diabetes mellitus without complication ([place])   . Family history of adverse reaction to anesthesia    FATHER STOPS BREATHING WITH ANESTHESIA  . Headache    HX MIGRAINES  . Seizures ([place])    1 SEIZURE  VERY STRESSED    Family History: Family History  Problem Relation Age of Onset  . Breast cancer Maternal Grandmother 53  . Breast cancer Paternal Grandfather 73    Social History    Socioeconomic History  . Marital status: Single    Spouse name: Not on file  . Number of children: Not on file  . Years of education: Not on file  . Highest education level: Not on file  Occupational History  . Not on file  Tobacco Use  . Smoking status: Current Every Day Smoker    Packs/day: 1.00    Years: 28.00    Pack years: 28.00    Types: Cigarettes  . Smokeless tobacco: Never Used  Vaping Use  . Vaping Use: Never used  Substance and Sexual Activity  . Alcohol use: No    Alcohol/week: 0.0 standard drinks  . Drug use: No  . Sexual activity: Not on file  Other Topics Concern  . Not on file  Social History Narrative  . Not on file   Social Determinants of Health   Financial Resource Strain:   . Difficulty of Paying Living Expenses:   Food Insecurity:   . Worried About Charity fundraiser in the Last Year:   . Arboriculturist in the Last Year:   Transportation Needs:   . Film/video editor (Medical):   Marland Kitchen Lack of Transportation (Non-Medical):   Physical Activity:   . Days of Exercise per Week:   . Minutes of Exercise per Session:   Stress:   . Feeling of Stress :   Social Connections:   . Frequency of Communication with Friends and Family:   . Frequency of Social Gatherings with Friends and Family:   . Attends Religious Services:   . Active Member of Clubs or Organizations:   . Attends Archivist Meetings:   Marland Kitchen Marital Status:   Intimate Partner Violence:   . Fear of Current or Ex-Partner:   . Emotionally Abused:   Marland Kitchen Physically Abused:   . Sexually Abused:      Review of Systems  Constitutional: Negative for chills, diaphoresis and fatigue.  HENT: Negative for ear pain, postnasal drip and sinus pressure.   Eyes: Negative for photophobia, discharge, redness, itching and visual disturbance.  Respiratory: Positive for chest tightness and shortness of breath. Negative for cough and wheezing.   Cardiovascular: Positive for chest pain. Negative  for palpitations and leg swelling.  Gastrointestinal: Negative for abdominal pain, constipation, diarrhea, nausea and vomiting.  Genitourinary: Negative for dysuria and flank pain.  Musculoskeletal: Negative for arthralgias, back pain, gait problem and neck pain.  Skin: Negative for color change.       Breast lump   Allergic/Immunologic: Negative for environmental allergies and food allergies.  Neurological: Negative for dizziness and headaches.  Hematological: Does not bruise/bleed easily.  Psychiatric/Behavioral: Negative for agitation, behavioral problems (depression) and hallucinations.    Vital Signs: BP 124/90   Pulse 82   Temp 98.2 F (36.8 C)   Resp 16   Ht 5\' 4"  (1.626 m)   Wt 161 lb (73 kg)  SpO2 98%   BMI 27.64 kg/m    Physical Exam Constitutional:      General: She is not in acute distress.    Appearance: She is well-developed. She is not diaphoretic.  HENT:     Head: Normocephalic and atraumatic.     Mouth/Throat:     Pharynx: No oropharyngeal exudate.  Eyes:     Pupils: Pupils are equal, round, and reactive to light.  Neck:     Thyroid: No thyromegaly.     Vascular: No JVD.     Trachea: No tracheal deviation.  Cardiovascular:     Rate and Rhythm: Normal rate and regular rhythm.     Heart sounds: Normal heart sounds. No murmur heard.  No friction rub. No gallop.   Pulmonary:     Effort: Pulmonary effort is normal. No respiratory distress.     Breath sounds: No wheezing or rales.  Chest:     Chest wall: No tenderness.  Abdominal:     General: Bowel sounds are normal.     Palpations: Abdomen is soft.  Musculoskeletal:        General: Normal range of motion.     Cervical back: Normal range of motion and neck supple.  Lymphadenopathy:     Cervical: No cervical adenopathy.  Skin:    General: Skin is warm and dry.  Neurological:     Mental Status: She is alert and oriented to person, place, and time.     Cranial Nerves: No cranial nerve deficit.   Psychiatric:        Behavior: Behavior normal.        Thought Content: Thought content normal.        Judgment: Judgment normal.    Assessment/Plan: 1. Shortness of breath - - will need further up for COPD, since she is a smoker, will need PFT's does have stress test this afetrnoon - CT CHEST WO CONTRAST; Future  2. History of diet-controlled diabetes - Will continue to monitor  - POCT HgB A1C  3. Visit for screening mammogram - No visible or palpable mass, will order screening. cxr showed abnormal density  - MM DIAG BREAST TOMO BILATERAL; Future  4. Intercostal pain - CT CHEST WO CONTRAST; Future  5. Mixed hyperlipidemia - Lipid Panel With LDL/HDL Ratio - TSH + free T4 - Hepatic function panel  6. Major depression, recurrent, chronic (HCC) - Start low dose lexapro  - escitalopram (LEXAPRO) 10 MG tablet; Take one tab between 5-7 pm for anxiety  Dispense: 30 tablet; Refill: 3  7. Nicotine dependence with current use Smoking cessation counseling: 1. Pt acknowledges the risks of long term smoking, she will try to quite smoking. 2. Options for different medications including nicotine products, chewing gum, patch etc, Wellbutrin and Chantix is discussed 3. Goal and date of compete cessation is discussed 4. Total time spent in smoking cessation is 10 min.   General Counseling: robinn overholt understanding of the findings of todays visit and agrees with plan of treatment. I have discussed any further diagnostic evaluation that may be needed or ordered today. We also reviewed her medications today. she has been encouraged to call the office with any questions or concerns that should arise related to todays visit.  Orders Placed This Encounter  Procedures  . CT CHEST WO CONTRAST  . MM DIAG BREAST TOMO BILATERAL  . Lipid Panel With LDL/HDL Ratio  . TSH + free T4  . Hepatic function panel  . POCT HgB A1C  Meds ordered this encounter  Medications  . escitalopram  (LEXAPRO) 10 MG tablet    Sig: Take one tab between 5-7 pm for anxiety    Dispense:  30 tablet    Refill:  3    Time spent:45 Minutes

## 2020-02-08 ENCOUNTER — Telehealth: Payer: Self-pay

## 2020-02-08 NOTE — Telephone Encounter (Signed)
Left message advising pt of ct order and asked her to call back and schedule follow up. beth

## 2020-02-10 ENCOUNTER — Ambulatory Visit: Payer: BLUE CROSS/BLUE SHIELD | Admitting: Nurse Practitioner

## 2020-02-10 LAB — TSH+FREE T4
Free T4: 1.1 ng/dL (ref 0.82–1.77)
TSH: 2.76 u[IU]/mL (ref 0.450–4.500)

## 2020-02-10 LAB — HEPATIC FUNCTION PANEL
ALT: 8 IU/L (ref 0–32)
AST: 8 IU/L (ref 0–40)
Albumin: 4 g/dL (ref 3.8–4.8)
Alkaline Phosphatase: 94 IU/L (ref 48–121)
Bilirubin Total: 0.3 mg/dL (ref 0.0–1.2)
Bilirubin, Direct: 0.09 mg/dL (ref 0.00–0.40)
Total Protein: 6.4 g/dL (ref 6.0–8.5)

## 2020-02-10 LAB — LIPID PANEL WITH LDL/HDL RATIO
Cholesterol, Total: 232 mg/dL — ABNORMAL HIGH (ref 100–199)
HDL: 39 mg/dL — ABNORMAL LOW (ref >39)
LDL Chol Calc (NIH): 153 mg/dL — ABNORMAL HIGH (ref 0–99)
LDL/HDL Ratio: 3.9 ratio — ABNORMAL HIGH (ref 0.0–3.2)
Triglycerides: 220 mg/dL — ABNORMAL HIGH (ref 0–149)
VLDL Cholesterol Cal: 40 mg/dL (ref 5–40)

## 2020-02-10 NOTE — Progress Notes (Signed)
Labs will be discussed on next visit

## 2020-02-14 ENCOUNTER — Ambulatory Visit
Admission: RE | Admit: 2020-02-14 | Discharge: 2020-02-14 | Disposition: A | Discharge status: Home / Self Care | Payer: BLUE CROSS/BLUE SHIELD | Source: Ambulatory Visit | Attending: Internal Medicine | Admitting: Internal Medicine

## 2020-02-14 ENCOUNTER — Other Ambulatory Visit: Payer: Self-pay

## 2020-02-14 DIAGNOSIS — Z1231 Encounter for screening mammogram for malignant neoplasm of breast: Secondary | ICD-10-CM | POA: Diagnosis not present

## 2020-02-14 DIAGNOSIS — N644 Mastodynia: Secondary | ICD-10-CM | POA: Diagnosis present

## 2020-02-15 ENCOUNTER — Telehealth: Payer: Self-pay

## 2020-02-15 NOTE — Telephone Encounter (Signed)
LMOM for office visit on 8/26

## 2020-02-17 ENCOUNTER — Other Ambulatory Visit: Payer: Self-pay

## 2020-02-17 ENCOUNTER — Encounter: Payer: Self-pay | Admitting: Hospice and Palliative Medicine

## 2020-02-17 ENCOUNTER — Ambulatory Visit: Payer: BLUE CROSS/BLUE SHIELD | Admitting: Hospice and Palliative Medicine

## 2020-02-17 DIAGNOSIS — N644 Mastodynia: Secondary | ICD-10-CM | POA: Diagnosis not present

## 2020-02-17 DIAGNOSIS — R1012 Left upper quadrant pain: Secondary | ICD-10-CM | POA: Diagnosis not present

## 2020-02-17 DIAGNOSIS — R630 Anorexia: Secondary | ICD-10-CM

## 2020-02-17 DIAGNOSIS — R911 Solitary pulmonary nodule: Secondary | ICD-10-CM

## 2020-02-17 DIAGNOSIS — R112 Nausea with vomiting, unspecified: Secondary | ICD-10-CM

## 2020-02-17 DIAGNOSIS — F411 Generalized anxiety disorder: Secondary | ICD-10-CM

## 2020-02-17 MED ORDER — ATORVASTATIN CALCIUM 10 MG PO TABS: 10.0000 mg | ORAL_TABLET | Freq: Every day | ORAL | 3 refills | Status: DC

## 2020-02-17 MED ORDER — ONDANSETRON HCL 8 MG PO TABS: 8.0000 mg | ORAL_TABLET | Freq: Three times a day (TID) | ORAL | 0 refills | Status: DC | PRN

## 2020-02-17 MED ORDER — OXYCODONE-ACETAMINOPHEN 5-325 MG PO TABS: 1.0000 | ORAL_TABLET | Freq: Four times a day (QID) | ORAL | 0 refills | Status: DC | PRN

## 2020-02-17 MED ORDER — VENLAFAXINE HCL ER 37.5 MG PO CP24: 37.5000 mg | ORAL_CAPSULE | Freq: Every day | ORAL | 0 refills | Status: DC

## 2020-02-17 NOTE — Progress Notes (Signed)
[facility] [place],  [address]  Internal MEDICINE  Office Visit Note  Patient Name: [name]  [id number]  [id number]  Date of Service: 02/18/2020  Chief Complaint  Patient presents with  . Follow-up    pt wants to discuss meds  . Diabetes  . Quality Metric Gaps    HepC, HIVscreening, TDAP, pap    HPI Patient is here for routine follow-up. 1. Left sided breast/chest pain. Continues to have constant left sided breast pain. Pain is located under her left breast and includes her breast. Pain is constant, sharp pain, at times pain is so intense it causes her to vomit. 2. Loss of appetite. She reports it has been a few weeks since she has eaten a full meal. She has a lack of appetite, no foods seem appealing to her. Reports that when she does she often vomits after. No reports of issues with swallowing, denies acid reflux symptoms, denies feeling as though she is chocking. Reports she just becomes extremely nauseated after eating and will frequently vomit. 3. Anxiety-Was recently started on Lexapro for GAD. She is requesting a change in her medications.  Current Medication: Outpatient Encounter Medications as of 02/17/2020  Medication Sig  . acetaminophen (TYLENOL) 325 MG tablet Take 650 mg by mouth every 6 (six) hours as needed for mild pain.  . cyclobenzaprine (FLEXERIL) 10 MG tablet Take 1 tablet (10 mg total) by mouth 3 (three) times daily as needed.  . cyclobenzaprine (FLEXERIL) 10 MG tablet Take 1 tablet (10 mg total) by mouth 3 (three) times daily as needed.  . hydrocortisone (ANUSOL-HC) 25 MG suppository Place 1 suppository (25 mg total) rectally 2 (two) times daily.  Marland Kitchen ibuprofen (ADVIL,MOTRIN) 600 MG tablet Take 1 tablet (600 mg total) by mouth every 6 (six) hours as needed.  . isosorbide mononitrate (IMDUR) 30 MG 24 hr tablet Take 81 mg by mouth once.  . lidocaine (LIDODERM) 5 % Place 1 patch onto the skin daily. Remove & Discard patch  within 12 hours or as directed by MD  . methocarbamol (ROBAXIN) 500 MG tablet Take 1 tablet (500 mg total) by mouth 4 (four) times daily.  . [DISCONTINUED] escitalopram (LEXAPRO) 10 MG tablet Take one tab between 5-7 pm for anxiety  . atorvastatin (LIPITOR) 10 MG tablet Take 1 tablet (10 mg total) by mouth daily.  . ondansetron (ZOFRAN) 8 MG tablet Take 1 tablet (8 mg total) by mouth every 8 (eight) hours as needed for nausea or vomiting.  Marland Kitchen oxyCODONE-acetaminophen (ROXICET) 5-325 MG tablet Take 1 tablet by mouth every 6 (six) hours as needed for severe pain.  Marland Kitchen venlafaxine XR (EFFEXOR XR) 37.5 MG 24 hr capsule Take 1 capsule (37.5 mg total) by mouth daily with breakfast.  . [DISCONTINUED] ondansetron (ZOFRAN) 8 MG tablet Take 1 tablet (8 mg total) by mouth every 8 (eight) hours as needed for nausea or vomiting. (Patient not taking: Reported on 02/17/2020)  . [DISCONTINUED] ondansetron (ZOFRAN) 8 MG tablet Take 1 tablet (8 mg total) by mouth every 8 (eight) hours as needed for nausea or vomiting.  . [DISCONTINUED] oxyCODONE-acetaminophen (PERCOCET) 7.5-325 MG tablet Take 1 tablet by mouth every 6 (six) hours as needed. (Patient not taking: Reported on 02/17/2020)  . [DISCONTINUED] oxyCODONE-acetaminophen (ROXICET) 5-325 MG tablet Take 1 tablet by mouth every 4 (four) hours as needed for severe pain. (Patient not taking: Reported on 02/17/2020)  . [DISCONTINUED] oxyCODONE-acetaminophen (ROXICET) 5-325 MG tablet Take 1 tablet by mouth every  6 (six) hours as needed for severe pain.   No facility-administered encounter medications on file as of 02/17/2020.    Surgical History: Past Surgical History:  Procedure Laterality Date  . ABDOMINAL HYSTERECTOMY    . APPENDECTOMY    . BACK SURGERY    . COLONOSCOPY WITH PROPOFOL N/A 06/01/2019   Procedure: COLONOSCOPY WITH PROPOFOL;  Surgeon: Jonathon Bellows, MD;  Location: [facility] ENDOSCOPY;  Service: Gastroenterology;  Laterality: N/A;    Medical History: Past  Medical History:  Diagnosis Date  . Back pain   . Cancer (HCC)    + CANCER CELLS TO UTERUS  . Diabetes mellitus without complication ([place])   . Family history of adverse reaction to anesthesia    FATHER STOPS BREATHING WITH ANESTHESIA  . Headache    HX MIGRAINES  . Seizures ([place])    1 SEIZURE  VERY STRESSED    Family History: Family History  Problem Relation Age of Onset  . Breast cancer Maternal Grandmother 66    Social History   Socioeconomic History  . Marital status: Single    Spouse name: Not on file  . Number of children: Not on file  . Years of education: Not on file  . Highest education level: Not on file  Occupational History  . Not on file  Tobacco Use  . Smoking status: Current Every Day Smoker    Packs/day: 1.00    Years: 28.00    Pack years: 28.00    Types: Cigarettes  . Smokeless tobacco: Never Used  Vaping Use  . Vaping Use: Never used  Substance and Sexual Activity  . Alcohol use: No    Alcohol/week: 0.0 standard drinks  . Drug use: No  . Sexual activity: Not on file  Other Topics Concern  . Not on file  Social History Narrative  . Not on file   Social Determinants of Health   Financial Resource Strain:   . Difficulty of Paying Living Expenses: Not on file  Food Insecurity:   . Worried About Charity fundraiser in the Last Year: Not on file  . Ran Out of Food in the Last Year: Not on file  Transportation Needs:   . Lack of Transportation (Medical): Not on file  . Lack of Transportation (Non-Medical): Not on file  Physical Activity:   . Days of Exercise per Week: Not on file  . Minutes of Exercise per Session: Not on file  Stress:   . Feeling of Stress : Not on file  Social Connections:   . Frequency of Communication with Friends and Family: Not on file  . Frequency of Social Gatherings with Friends and Family: Not on file  . Attends Religious Services: Not on file  . Active Member of Clubs or Organizations: Not on file  . Attends  Archivist Meetings: Not on file  . Marital Status: Not on file  Intimate Partner Violence:   . Fear of Current or Ex-Partner: Not on file  . Emotionally Abused: Not on file  . Physically Abused: Not on file  . Sexually Abused: Not on file    Review of Systems  Constitutional: Positive for activity change, appetite change and fatigue. Negative for chills and diaphoresis.  HENT: Negative for congestion, sinus pressure, sinus pain, sore throat, trouble swallowing and voice change.   Eyes: Negative for photophobia and visual disturbance.  Respiratory: Negative for cough, shortness of breath and wheezing.   Cardiovascular: Negative for chest pain, palpitations and leg swelling.  Pain in left breast/upper abdomen/chest  Gastrointestinal: Positive for nausea and vomiting. Negative for abdominal pain, constipation and diarrhea.  Genitourinary: Negative for dysuria and flank pain.  Musculoskeletal: Negative for arthralgias, back pain, gait problem and neck pain.  Skin: Negative for color change.  Allergic/Immunologic: Negative for environmental allergies and food allergies.  Neurological: Negative for dizziness, tremors, weakness, light-headedness and headaches.  Hematological: Does not bruise/bleed easily.  Psychiatric/Behavioral: Negative for agitation, behavioral problems (depression) and hallucinations.    Vital Signs: BP 110/65   Pulse 88   Temp 99.1 F (37.3 C)   Resp 16   Ht 5\' 4"  (1.626 m)   Wt 160 lb 3.2 oz (72.7 kg)   SpO2 97%   BMI 27.50 kg/m    Physical Exam Vitals reviewed.  Constitutional:      Appearance: She is ill-appearing.  HENT:     Mouth/Throat:     Mouth: Mucous membranes are moist.     Pharynx: Oropharynx is clear.  Cardiovascular:     Rate and Rhythm: Normal rate and regular rhythm.     Pulses: Normal pulses.     Heart sounds: Normal heart sounds.  Pulmonary:     Effort: Pulmonary effort is normal.     Breath sounds: Normal breath  sounds.  Chest:       Comments: Area of pain as seen in illustration. Constant, sharp pain. Guarding this area during examination. Abdominal:     General: Abdomen is flat.     Palpations: Abdomen is soft.  Musculoskeletal:        General: Normal range of motion.     Cervical back: Normal range of motion.  Skin:    General: Skin is warm.  Neurological:     General: No focal deficit present.     Mental Status: She is alert and oriented to person, place, and time. Mental status is at baseline.  Psychiatric:        Mood and Affect: Mood normal.        Behavior: Behavior normal.        Thought Content: Thought content normal.    Assessment/Plan: 1. Breast pain, left Continues to have left breast pain extending below breast into upper left quadrant of abdomen. Mammogram completed 8/23, normal mammogram, no evidence of malignancy or reason for pain. Will give limited supply of pain medication to help with pain management as the pain is rated 8/10 today and is constant pain. Pain is interfering with her daily life as she has been unable to work. Has had stress test recently, scheduled to follow-up with cardiology Monday to discuss findings of test. - oxyCODONE-acetaminophen (ROXICET) 5-325 MG tablet; Take 1 tablet by mouth every 6 (six) hours as needed for severe pain.  Dispense: 20 tablet; Refill: 0  2. Left upper quadrant abdominal pain Left upper quadrant pain extending from left breast. Reports of loss of appetite, when she is able to eat she reports intense nausea followed frequently by vomiting. - CT Abdomen Pelvis Wo Contrast; Future - oxyCODONE-acetaminophen (ROXICET) 5-325 MG tablet; Take 1 tablet by mouth every 6 (six) hours as needed for severe pain.  Dispense: 20 tablet; Refill: 0  3. Lung nodule Chest CT findings from 8/19 reveal left upper lobe subpleural 0.3 cm nodule. Will have her see Dr. Devona Konig for pulmonary consultation on Monday 8/30.  4. GAD (generalized anxiety  disorder) Was recently started on Lexapro for GAD. She does not like the way Lexapro is making her feel, says  she feels like a "zombie" al day. Will stop Lexapro and start low dose Effexor. Will closely follow-up for effectiveness and tolerability to medication. If needed will increase dosage gradually. - venlafaxine XR (EFFEXOR XR) 37.5 MG 24 hr capsule; Take 1 capsule (37.5 mg total) by mouth daily with breakfast.  Dispense: 30 capsule; Refill: 0  5. Loss of appetite Has been unable to eat a full meal in several weeks from the time between today and last office visit 8/16 there has been a 1 pound weight loss. Encouraged to try different meal replacement shakes to ensure she is receiving adequate nutrition if she continues to have loss of appetite.  6. Intractable vomiting with nausea, unspecified vomiting type Reports vomiting secondary to intense pain in left breast/abdomen and vomiting following eating. No evidence of swallowing complications or gastric reflux. Zofran as needed for nausea and vomiting. - ondansetron (ZOFRAN) 8 MG tablet; Take 1 tablet (8 mg total) by mouth every 8 (eight) hours as needed for nausea or vomiting.  Dispense: 20 tablet; Refill: 0  General Counseling: [name] verbalizes understanding of the findings of todays visit and agrees with plan of treatment. I have discussed any further diagnostic evaluation that may be needed or ordered today. We also reviewed her medications today. she has been encouraged to call the office with any questions or concerns that should arise related to todays visit.  Reviewed risks and possible side effects associated with taking opiates, benzodiazepines and other CNS depressants. Combination of these could cause dizziness and drowsiness. Advised patient not to drive or operate machinery when taking these medications, as patient's and other's life can be at risk and will have consequences. Patient verbalized understanding in this matter. Dependence  and abuse for these drugs will be monitored closely. A Controlled substance policy and procedure is on file which allows [facility] to order a urine drug screen test at any visit. Patient understands and agrees with the plan  Orders Placed This Encounter  Procedures  . CT Abdomen Pelvis Wo Contrast    Meds ordered this encounter  Medications  . venlafaxine XR (EFFEXOR XR) 37.5 MG 24 hr capsule    Sig: Take 1 capsule (37.5 mg total) by mouth daily with breakfast.    Dispense:  30 capsule    Refill:  0  . DISCONTD: oxyCODONE-acetaminophen (ROXICET) 5-325 MG tablet    Sig: Take 1 tablet by mouth every 6 (six) hours as needed for severe pain.    Dispense:  20 tablet    Refill:  0  . atorvastatin (LIPITOR) 10 MG tablet    Sig: Take 1 tablet (10 mg total) by mouth daily.    Dispense:  90 tablet    Refill:  3  . DISCONTD: ondansetron (ZOFRAN) 8 MG tablet    Sig: Take 1 tablet (8 mg total) by mouth every 8 (eight) hours as needed for nausea or vomiting.    Dispense:  20 tablet    Refill:  0  . oxyCODONE-acetaminophen (ROXICET) 5-325 MG tablet    Sig: Take 1 tablet by mouth every 6 (six) hours as needed for severe pain.    Dispense:  20 tablet    Refill:  0  . ondansetron (ZOFRAN) 8 MG tablet    Sig: Take 1 tablet (8 mg total) by mouth every 8 (eight) hours as needed for nausea or vomiting.    Dispense:  20 tablet    Refill:  0    Time spent: 35 Minutes  This patient was seen by Theodoro Grist AGNP-C in Collaboration with Dr Lavera Guise as a part of collaborative care agreement     [name] AGNP-C Internal medicine

## 2020-02-18 ENCOUNTER — Encounter: Payer: Self-pay | Admitting: Hospice and Palliative Medicine

## 2020-02-21 ENCOUNTER — Encounter: Payer: Self-pay | Admitting: Adult Health

## 2020-02-21 ENCOUNTER — Ambulatory Visit: Payer: BLUE CROSS/BLUE SHIELD | Admitting: Internal Medicine

## 2020-02-21 ENCOUNTER — Other Ambulatory Visit: Payer: Self-pay

## 2020-02-21 VITALS — BP 119/84 | HR 85 | Temp 98.4°F | Resp 16 | Ht 64.0 in | Wt 156.2 lb

## 2020-02-21 DIAGNOSIS — F17209 Nicotine dependence, unspecified, with unspecified nicotine-induced disorders: Secondary | ICD-10-CM | POA: Diagnosis not present

## 2020-02-21 DIAGNOSIS — J209 Acute bronchitis, unspecified: Secondary | ICD-10-CM

## 2020-02-21 DIAGNOSIS — R1012 Left upper quadrant pain: Secondary | ICD-10-CM

## 2020-02-21 DIAGNOSIS — R911 Solitary pulmonary nodule: Secondary | ICD-10-CM | POA: Diagnosis not present

## 2020-02-21 DIAGNOSIS — R0602 Shortness of breath: Secondary | ICD-10-CM

## 2020-02-21 DIAGNOSIS — J440 Chronic obstructive pulmonary disease with acute lower respiratory infection: Secondary | ICD-10-CM | POA: Diagnosis not present

## 2020-02-21 DIAGNOSIS — Z716 Tobacco abuse counseling: Secondary | ICD-10-CM

## 2020-02-21 MED ORDER — TRAMADOL HCL 50 MG PO TABS: 50.0000 mg | ORAL_TABLET | Freq: Three times a day (TID) | ORAL | 0 refills | Status: AC | PRN

## 2020-02-21 MED ORDER — DULERA 200-5 MCG/ACT IN AERO: 2.0000 | INHALATION_SPRAY | Freq: Two times a day (BID) | RESPIRATORY_TRACT | 3 refills | Status: DC

## 2020-02-21 NOTE — Progress Notes (Signed)
[facility] [place], [place] [address]  Internal MEDICINE  Office Visit Note  Patient Name: [name]  [id number]  [id number]  Date of Service: 02/24/2020  Chief Complaint  Patient presents with  . Results    CT results     HPI Pt is here to discuss results of CT scan of Chest. There is a nodule ( 0.5 cm) in left upper chest. Pain is better controlled now. Her recent stress test is normal. Unable to see PFT done at [facility] in April of this year.She continues to smoke and is congested. She is thinking about smoking cessation     Current Medication: Outpatient Encounter Medications as of 02/21/2020  Medication Sig  . acetaminophen (TYLENOL) 325 MG tablet Take 650 mg by mouth every 6 (six) hours as needed for mild pain.  Marland Kitchen atorvastatin (LIPITOR) 10 MG tablet Take 1 tablet (10 mg total) by mouth daily.  . cyclobenzaprine (FLEXERIL) 10 MG tablet Take 1 tablet (10 mg total) by mouth 3 (three) times daily as needed.  . cyclobenzaprine (FLEXERIL) 10 MG tablet Take 1 tablet (10 mg total) by mouth 3 (three) times daily as needed.  . hydrocortisone (ANUSOL-HC) 25 MG suppository Place 1 suppository (25 mg total) rectally 2 (two) times daily.  Marland Kitchen ibuprofen (ADVIL,MOTRIN) 600 MG tablet Take 1 tablet (600 mg total) by mouth every 6 (six) hours as needed.  . isosorbide mononitrate (IMDUR) 30 MG 24 hr tablet Take 81 mg by mouth once.  . lidocaine (LIDODERM) 5 % Place 1 patch onto the skin daily. Remove & Discard patch within 12 hours or as directed by MD  . methocarbamol (ROBAXIN) 500 MG tablet Take 1 tablet (500 mg total) by mouth 4 (four) times daily.  . ondansetron (ZOFRAN) 8 MG tablet Take 1 tablet (8 mg total) by mouth every 8 (eight) hours as needed for nausea or vomiting.  Marland Kitchen oxyCODONE-acetaminophen (ROXICET) 5-325 MG tablet Take 1 tablet by mouth every 6 (six) hours as needed for severe pain.  Marland Kitchen venlafaxine XR (EFFEXOR XR) 37.5 MG 24 hr capsule Take 1 capsule  (37.5 mg total) by mouth daily with breakfast.  . azithromycin (ZITHROMAX) 250 MG tablet Take one tab a day for 10 days for uri  . mometasone-formoterol (DULERA) 200-5 MCG/ACT AERO Inhale 2 puffs into the lungs 2 (two) times daily.  . traMADol (ULTRAM) 50 MG tablet Take 1 tablet (50 mg total) by mouth every 8 (eight) hours as needed for up to 5 days.   No facility-administered encounter medications on file as of 02/21/2020.    Surgical History: Past Surgical History:  Procedure Laterality Date  . ABDOMINAL HYSTERECTOMY    . APPENDECTOMY    . BACK SURGERY    . COLONOSCOPY WITH PROPOFOL N/A 06/01/2019   Procedure: COLONOSCOPY WITH PROPOFOL;  Surgeon: Jonathon Bellows, MD;  Location: [facility] ENDOSCOPY;  Service: Gastroenterology;  Laterality: N/A;    Medical History: Past Medical History:  Diagnosis Date  . Back pain   . Cancer (HCC)    + CANCER CELLS TO UTERUS  . Diabetes mellitus without complication ([place])   . Family history of adverse reaction to anesthesia    FATHER STOPS BREATHING WITH ANESTHESIA  . Headache    HX MIGRAINES  . Seizures ([place])    1 SEIZURE  VERY STRESSED    Family History: Family History  Problem Relation Age of Onset  . Breast cancer Maternal Grandmother 99    Social History   Socioeconomic  History  . Marital status: Single    Spouse name: Not on file  . Number of children: Not on file  . Years of education: Not on file  . Highest education level: Not on file  Occupational History  . Not on file  Tobacco Use  . Smoking status: Current Every Day Smoker    Packs/day: 1.00    Years: 28.00    Pack years: 28.00    Types: Cigarettes  . Smokeless tobacco: Never Used  Vaping Use  . Vaping Use: Never used  Substance and Sexual Activity  . Alcohol use: No    Alcohol/week: 0.0 standard drinks  . Drug use: No  . Sexual activity: Not on file  Other Topics Concern  . Not on file  Social History Narrative  . Not on file   Social Determinants of Health    Financial Resource Strain:   . Difficulty of Paying Living Expenses: Not on file  Food Insecurity:   . Worried About Charity fundraiser in the Last Year: Not on file  . Ran Out of Food in the Last Year: Not on file  Transportation Needs:   . Lack of Transportation (Medical): Not on file  . Lack of Transportation (Non-Medical): Not on file  Physical Activity:   . Days of Exercise per Week: Not on file  . Minutes of Exercise per Session: Not on file  Stress:   . Feeling of Stress : Not on file  Social Connections:   . Frequency of Communication with Friends and Family: Not on file  . Frequency of Social Gatherings with Friends and Family: Not on file  . Attends Religious Services: Not on file  . Active Member of Clubs or Organizations: Not on file  . Attends Archivist Meetings: Not on file  . Marital Status: Not on file  Intimate Partner Violence:   . Fear of Current or Ex-Partner: Not on file  . Emotionally Abused: Not on file  . Physically Abused: Not on file  . Sexually Abused: Not on file    Review of Systems  Constitutional: Negative for chills, diaphoresis and fatigue.  HENT: Negative for ear pain, postnasal drip and sinus pressure.   Eyes: Negative for photophobia, discharge, redness, itching and visual disturbance.  Respiratory: Positive for cough and shortness of breath. Negative for wheezing.   Cardiovascular: Negative for chest pain, palpitations and leg swelling.       Pain in lower left chest and upper left abdominal area   Gastrointestinal: Negative for abdominal pain, constipation, diarrhea, nausea and vomiting.  Genitourinary: Negative for dysuria and flank pain.  Musculoskeletal: Negative for arthralgias, back pain, gait problem and neck pain.  Skin: Negative for color change.  Allergic/Immunologic: Negative for environmental allergies and food allergies.  Neurological: Negative for dizziness and headaches.  Hematological: Does not bruise/bleed  easily.  Psychiatric/Behavioral: Negative for agitation, behavioral problems (depression) and hallucinations.   Vital Signs: BP 119/84   Pulse 85   Temp 98.4 F (36.9 C)   Resp 16   Ht 5\' 4"  (1.626 m)   Wt 156 lb 3.2 oz (70.9 kg)   SpO2 (!) 89%   BMI 26.81 kg/m   Physical Exam Constitutional:      General: She is not in acute distress.    Appearance: She is well-developed. She is not diaphoretic.  HENT:     Head: Normocephalic and atraumatic.     Mouth/Throat:     Pharynx: No oropharyngeal exudate.  Eyes:     Pupils: Pupils are equal, round, and reactive to light.  Neck:     Thyroid: No thyromegaly.     Vascular: No JVD.     Trachea: No tracheal deviation.  Cardiovascular:     Rate and Rhythm: Normal rate and regular rhythm.     Heart sounds: Normal heart sounds. No murmur heard.  No friction rub. No gallop.   Pulmonary:     Effort: Pulmonary effort is normal. No respiratory distress.     Breath sounds: No wheezing or rales.     Comments: Congested with audible wheezing  Chest:     Chest wall: No tenderness.  Abdominal:     General: Bowel sounds are normal.     Palpations: Abdomen is soft.  Musculoskeletal:        General: Normal range of motion.     Cervical back: Normal range of motion and neck supple.  Lymphadenopathy:     Cervical: No cervical adenopathy.  Skin:    General: Skin is warm and dry.  Neurological:     Mental Status: She is alert and oriented to person, place, and time.     Cranial Nerves: No cranial nerve deficit.  Psychiatric:        Behavior: Behavior normal.        Thought Content: Thought content normal.        Judgment: Judgment normal.    Assessment/Plan: 1. Lung nodule - CT chest reviewed with pt and her daughter, has a nodule less than 1 cm , will need follow up in 6 months since she is a current smoker   2. COPD with acute bronchitis ([place]) - Will review PFT - 6 minute walk; Future( intial o2 sats were 89% however repeat and 6  min walk did not show any desats  - mometasone-formoterol (DULERA) 200-5 MCG/ACT AERO; Inhale 2 puffs into the lungs 2 (two) times daily.  Dispense: 1 each; Refill: 3 - azithromycin (ZITHROMAX) 250 MG tablet; Take one tab a day for 10 days for uri  Dispense: 10 tablet; Refill: 0  3. Tobacco use disorder, continuous - Lengthy discussion about products and medicine available. She will cut down her number of cigarettes until next visit, might add Remeron at bed time    4. Encounter for smoking cessation counseling Smoking cessation counseling: 1. Pt acknowledges the risks of long term smoking, she will try to quite smoking. 2. Options for different medications including nicotine products, chewing gum, patch etc, Wellbutrin and Chantix is discussed 3. Goal and date of compete cessation is discussed 4. Total time spent in smoking cessation is 10-12 min.  5. Left upper quadrant abdominal pain - Might need to see GI after u/s abdomen is ordered  - traMADol (ULTRAM) 50 MG tablet; Take 1 tablet (50 mg total) by mouth every 8 (eight) hours as needed for up to 5 days.  Dispense: 15 tablet; Refill: 0  General Counseling: Cherlyn verbalizes understanding of the findings of todays visit and agrees with plan of treatment. I have discussed any further diagnostic evaluation that may be needed or ordered today. We also reviewed her medications today. she has been encouraged to call the office with any questions or concerns that should arise related to todays visit.   Orders Placed This Encounter  Procedures  . 6 minute walk    Meds ordered this encounter  Medications  . mometasone-formoterol (DULERA) 200-5 MCG/ACT AERO    Sig: Inhale 2 puffs into the lungs  2 (two) times daily.    Dispense:  1 each    Refill:  3  . traMADol (ULTRAM) 50 MG tablet    Sig: Take 1 tablet (50 mg total) by mouth every 8 (eight) hours as needed for up to 5 days.    Dispense:  15 tablet    Refill:  0  . azithromycin  (ZITHROMAX) 250 MG tablet    Sig: Take one tab a day for 10 days for uri    Dispense:  10 tablet    Refill:  0    Total time spent:35 Minutes Time spent includes review of chart, medications, test results, and follow up plan with the patient.    Dr Lavera Guise Internal medicine

## 2020-02-24 MED ORDER — AZITHROMYCIN 250 MG PO TABS: ORAL_TABLET | ORAL | 0 refills | Status: DC

## 2020-02-29 DIAGNOSIS — I209 Angina pectoris, unspecified: Secondary | ICD-10-CM | POA: Diagnosis present

## 2020-03-07 ENCOUNTER — Telehealth: Payer: Self-pay

## 2020-03-07 NOTE — Telephone Encounter (Signed)
Confirmed and screened for OV on 9/16

## 2020-03-07 NOTE — Telephone Encounter (Signed)
comfirmed and screened for OV on 9/16

## 2020-03-09 ENCOUNTER — Encounter: Payer: Self-pay | Admitting: Hospice and Palliative Medicine

## 2020-03-09 ENCOUNTER — Other Ambulatory Visit: Payer: Self-pay

## 2020-03-09 ENCOUNTER — Ambulatory Visit: Payer: BLUE CROSS/BLUE SHIELD | Admitting: Hospice and Palliative Medicine

## 2020-03-09 DIAGNOSIS — R0609 Other forms of dyspnea: Secondary | ICD-10-CM

## 2020-03-09 DIAGNOSIS — K047 Periapical abscess without sinus: Secondary | ICD-10-CM

## 2020-03-09 DIAGNOSIS — J302 Other seasonal allergic rhinitis: Secondary | ICD-10-CM

## 2020-03-09 DIAGNOSIS — J449 Chronic obstructive pulmonary disease, unspecified: Secondary | ICD-10-CM | POA: Diagnosis not present

## 2020-03-09 DIAGNOSIS — R0600 Dyspnea, unspecified: Secondary | ICD-10-CM | POA: Diagnosis not present

## 2020-03-09 DIAGNOSIS — R1012 Left upper quadrant pain: Secondary | ICD-10-CM | POA: Diagnosis not present

## 2020-03-09 MED ORDER — IPRATROPIUM-ALBUTEROL 0.5-2.5 (3) MG/3ML IN SOLN: 3.0000 mL | Freq: Four times a day (QID) | RESPIRATORY_TRACT | 1 refills | Status: DC | PRN

## 2020-03-09 MED ORDER — AMOXICILLIN-POT CLAVULANATE 875-125 MG PO TABS: 1.0000 | ORAL_TABLET | Freq: Two times a day (BID) | ORAL | 0 refills | Status: DC

## 2020-03-09 MED ORDER — FLUTICASONE PROPIONATE 50 MCG/ACT NA SUSP: 2.0000 | Freq: Every day | NASAL | 2 refills | Status: DC

## 2020-03-09 MED ORDER — OXYCODONE-ACETAMINOPHEN 5-325 MG PO TABS: 1.0000 | ORAL_TABLET | Freq: Four times a day (QID) | ORAL | 0 refills | Status: DC | PRN

## 2020-03-09 NOTE — Progress Notes (Signed)
[facility] [place], [place] [address]  Internal MEDICINE  Office Visit Note  Patient Name: [name]  [id number]  [id number]  Date of Service: 03/11/2020  Chief Complaint  Patient presents with  . Follow-up  . Diabetes  . Nasal Congestion    needs nasal spray  . Quality Metric Gaps    flu,tetnaus,covid,hep C,pap    HPI Patient is here for routine follow-up We have been following her for non-specific left upper abdominal/breast pain Reviewed her abdominal CT scan as well as labs with her, CT scan normal So far, mammogram has been normal, small pulmonary nodule was found has been seen by pulmonology no further testing or treatment at this time follow-up CT scan in 6 months, cardiology is following and plan to do heart cath for possible occlusion PFT testing-mild COPD started on [facility] She does complain of a possible infected tooth or abscess that has been going on for several months, she is not able to afford dental care but has an appointment with a dental clinic offering discounted services later this month She was diagnosed with COVID at the end of July, has noticed she has increased shortness of breath with exertion since diagnosis She does continue to smoke about 4-6 cigarettes a day  Current Medication: Outpatient Encounter Medications as of 03/09/2020  Medication Sig  . acetaminophen (TYLENOL) 325 MG tablet Take 650 mg by mouth every 6 (six) hours as needed for mild pain.  Marland Kitchen atorvastatin (LIPITOR) 10 MG tablet Take 1 tablet (10 mg total) by mouth daily.  Marland Kitchen azithromycin (ZITHROMAX) 250 MG tablet Take one tab a day for 10 days for uri  . cyclobenzaprine (FLEXERIL) 10 MG tablet Take 1 tablet (10 mg total) by mouth 3 (three) times daily as needed.  . cyclobenzaprine (FLEXERIL) 10 MG tablet Take 1 tablet (10 mg total) by mouth 3 (three) times daily as needed.  . hydrocortisone (ANUSOL-HC) 25 MG suppository Place 1 suppository (25 mg total)  rectally 2 (two) times daily.  Marland Kitchen ibuprofen (ADVIL,MOTRIN) 600 MG tablet Take 1 tablet (600 mg total) by mouth every 6 (six) hours as needed.  . isosorbide mononitrate (IMDUR) 30 MG 24 hr tablet Take 81 mg by mouth once.  . methocarbamol (ROBAXIN) 500 MG tablet Take 1 tablet (500 mg total) by mouth 4 (four) times daily.  . mometasone-formoterol (DULERA) 200-5 MCG/ACT AERO Inhale 2 puffs into the lungs 2 (two) times daily.  . ondansetron (ZOFRAN) 8 MG tablet Take 1 tablet (8 mg total) by mouth every 8 (eight) hours as needed for nausea or vomiting.  Marland Kitchen oxyCODONE-acetaminophen (ROXICET) 5-325 MG tablet Take 1 tablet by mouth every 6 (six) hours as needed for severe pain.  Marland Kitchen venlafaxine XR (EFFEXOR XR) 37.5 MG 24 hr capsule Take 1 capsule (37.5 mg total) by mouth daily with breakfast.  . [DISCONTINUED] lidocaine (LIDODERM) 5 % Place 1 patch onto the skin daily. Remove & Discard patch within 12 hours or as directed by MD  . [DISCONTINUED] oxyCODONE-acetaminophen (ROXICET) 5-325 MG tablet Take 1 tablet by mouth every 6 (six) hours as needed for severe pain.  Marland Kitchen amoxicillin-clavulanate (AUGMENTIN) 875-125 MG tablet Take 1 tablet by mouth 2 (two) times daily.  . fluticasone (FLONASE) 50 MCG/ACT nasal spray Place 2 sprays into both nostrils daily.  Marland Kitchen ipratropium-albuterol (DUONEB) 0.5-2.5 (3) MG/3ML SOLN Take 3 mLs by nebulization every 6 (six) hours as needed.   No facility-administered encounter medications on file as of 03/09/2020.  Surgical History: Past Surgical History:  Procedure Laterality Date  . ABDOMINAL HYSTERECTOMY    . APPENDECTOMY    . BACK SURGERY    . COLONOSCOPY WITH PROPOFOL N/A 06/01/2019   Procedure: COLONOSCOPY WITH PROPOFOL;  Surgeon: Jonathon Bellows, MD;  Location: [facility] ENDOSCOPY;  Service: Gastroenterology;  Laterality: N/A;    Medical History: Past Medical History:  Diagnosis Date  . Back pain   . Cancer (HCC)    + CANCER CELLS TO UTERUS  . COPD (chronic obstructive  pulmonary disease) ([place])   . Diabetes mellitus without complication ([place])   . Family history of adverse reaction to anesthesia    FATHER STOPS BREATHING WITH ANESTHESIA  . Headache    HX MIGRAINES  . Seizures ([place])    1 SEIZURE  VERY STRESSED    Family History: Family History  Problem Relation Age of Onset  . Breast cancer Maternal Grandmother 86    Social History   Socioeconomic History  . Marital status: Single    Spouse name: Not on file  . Number of children: Not on file  . Years of education: Not on file  . Highest education level: Not on file  Occupational History  . Not on file  Tobacco Use  . Smoking status: Current Every Day Smoker    Packs/day: 1.00    Years: 28.00    Pack years: 28.00    Types: Cigarettes  . Smokeless tobacco: Never Used  Vaping Use  . Vaping Use: Never used  Substance and Sexual Activity  . Alcohol use: No    Alcohol/week: 0.0 standard drinks  . Drug use: No  . Sexual activity: Not on file  Other Topics Concern  . Not on file  Social History Narrative  . Not on file   Social Determinants of Health   Financial Resource Strain:   . Difficulty of Paying Living Expenses: Not on file  Food Insecurity:   . Worried About Charity fundraiser in the Last Year: Not on file  . Ran Out of Food in the Last Year: Not on file  Transportation Needs:   . Lack of Transportation (Medical): Not on file  . Lack of Transportation (Non-Medical): Not on file  Physical Activity:   . Days of Exercise per Week: Not on file  . Minutes of Exercise per Session: Not on file  Stress:   . Feeling of Stress : Not on file  Social Connections:   . Frequency of Communication with Friends and Family: Not on file  . Frequency of Social Gatherings with Friends and Family: Not on file  . Attends Religious Services: Not on file  . Active Member of Clubs or Organizations: Not on file  . Attends Archivist Meetings: Not on file  . Marital Status: Not on  file  Intimate Partner Violence:   . Fear of Current or Ex-Partner: Not on file  . Emotionally Abused: Not on file  . Physically Abused: Not on file  . Sexually Abused: Not on file      Review of Systems  Constitutional: Negative for chills, diaphoresis and fatigue.  HENT: Negative for ear pain, postnasal drip and sinus pressure.        Decaying teeth, possible infection or abscess  Eyes: Negative for photophobia, discharge, redness, itching and visual disturbance.  Respiratory: Positive for shortness of breath. Negative for cough and wheezing.   Cardiovascular: Negative for chest pain, palpitations and leg swelling.  Gastrointestinal: Positive for abdominal pain. Negative  for constipation, diarrhea, nausea and vomiting.       Left upper abdominal pain  Genitourinary: Negative for dysuria and flank pain.  Musculoskeletal: Negative for arthralgias, back pain, gait problem and neck pain.  Skin: Negative for color change.  Allergic/Immunologic: Negative for environmental allergies and food allergies.  Neurological: Negative for dizziness and headaches.  Hematological: Does not bruise/bleed easily.  Psychiatric/Behavioral: Negative for agitation, behavioral problems (depression) and hallucinations.    Vital Signs: BP 112/79   Pulse 82   Temp (!) 97.3 F (36.3 C)   Resp 16   Ht 5\' 4"  (1.626 m)   Wt 158 lb 12.8 oz (72 kg)   SpO2 99%   BMI 27.26 kg/m    Physical Exam Vitals reviewed.  Constitutional:      Appearance: Normal appearance. She is normal weight.  HENT:     Nose: Nose normal.     Mouth/Throat:     Mouth: Mucous membranes are moist.     Dentition: Abnormal dentition. Gingival swelling and dental abscesses present.      Comments: Left bottom gum, swollen and red Cardiovascular:     Rate and Rhythm: Normal rate and regular rhythm.     Pulses: Normal pulses.     Heart sounds: Normal heart sounds.  Pulmonary:     Effort: Pulmonary effort is normal.     Breath  sounds: Normal breath sounds.  Abdominal:     General: Abdomen is flat.     Palpations: Abdomen is soft.     Tenderness: There is abdominal tenderness. There is guarding.  Musculoskeletal:        General: Normal range of motion.     Cervical back: Normal range of motion.  Skin:    General: Skin is warm.  Neurological:     General: No focal deficit present.     Mental Status: She is alert. Mental status is at baseline.  Psychiatric:        Mood and Affect: Mood normal.        Thought Content: Thought content normal.    Assessment/Plan: 1. Left upper quadrant abdominal pain Non-specific abdominal pain, refer to GI for further evaluation. Continue with cardiology follow-up and heart cath. Short term pain medication sent to pharmacy, will need to refer to pain management to help with pain management assistance. At this time, testing negative for any acute processes correlating to her pain. She says he pain is constant, rated a 10 out of 10 today, at time is awakened from sleep with intense pain. With pain medication pain improves to about 6 out of 10. - Ambulatory referral to Gastroenterology - oxyCODONE-acetaminophen (ROXICET) 5-325 MG tablet; Take 1 tablet by mouth every 6 (six) hours as needed for severe pain.  Dispense: 20 tablet; Refill: 0  2. Dyspnea on exertion Will assess and review results of echo due to her symptoms of dyspnea on exertion since recent COVID diagnosis. - ECHOCARDIOGRAM COMPLETE; Future  3. Chronic obstructive pulmonary disease, unspecified COPD type ([place]) Nebulizer machine given today in office, duo-neb prescription sent for treatment of shortness of breath or wheezing. PFT testing done, mild COPD, was started on Dulera at that time. Continue with routine monitoring, - ipratropium-albuterol (DUONEB) 0.5-2.5 (3) MG/3ML SOLN; Take 3 mLs by nebulization every 6 (six) hours as needed.  Dispense: 360 mL; Refill: 1 - For home use only DME Nebulizer machine  4.  Seasonal allergies Symptoms of nasal congestion and infrequent sneezing, will start on Flonase to help with  symptom management. - fluticasone (FLONASE) 50 MCG/ACT nasal spray; Place 2 sprays into both nostrils daily.  Dispense: 15.8 mL; Refill: 2  5. Dental abscess Advised she needs to be seen by dentist as soon as she is able. Will treat dental abscess with Augmentin for 10 days. It may also be of importance to inform her cardiologist about her dental infection, she has a history of sepsis from a previous abscess infection. - amoxicillin-clavulanate (AUGMENTIN) 875-125 MG tablet; Take 1 tablet by mouth 2 (two) times daily.  Dispense: 20 tablet; Refill: 0  General Counseling: Kiamesha verbalizes understanding of the findings of todays visit and agrees with plan of treatment. I have discussed any further diagnostic evaluation that may be needed or ordered today. We also reviewed her medications today. she has been encouraged to call the office with any questions or concerns that should arise related to todays visit.    Orders Placed This Encounter  Procedures  . For home use only DME Nebulizer machine  . Ambulatory referral to Gastroenterology  . ECHOCARDIOGRAM COMPLETE    Meds ordered this encounter  Medications  . ipratropium-albuterol (DUONEB) 0.5-2.5 (3) MG/3ML SOLN    Sig: Take 3 mLs by nebulization every 6 (six) hours as needed.    Dispense:  360 mL    Refill:  1  . fluticasone (FLONASE) 50 MCG/ACT nasal spray    Sig: Place 2 sprays into both nostrils daily.    Dispense:  15.8 mL    Refill:  2  . oxyCODONE-acetaminophen (ROXICET) 5-325 MG tablet    Sig: Take 1 tablet by mouth every 6 (six) hours as needed for severe pain.    Dispense:  20 tablet    Refill:  0  . amoxicillin-clavulanate (AUGMENTIN) 875-125 MG tablet    Sig: Take 1 tablet by mouth 2 (two) times daily.    Dispense:  20 tablet    Refill:  0    Time spent: 35 Minutes   This patient was seen by Theodoro Grist  AGNP-C in Collaboration with Dr Lavera Guise as a part of collaborative care agreement     Tanna Furry. Saurav Crumble AGNP-C Internal medicine

## 2020-03-11 ENCOUNTER — Encounter: Payer: Self-pay | Admitting: Hospice and Palliative Medicine

## 2020-03-22 ENCOUNTER — Other Ambulatory Visit
Admission: RE | Admit: 2020-03-22 | Discharge: 2020-03-22 | Disposition: A | Discharge status: Home / Self Care | Payer: BLUE CROSS/BLUE SHIELD | Source: Ambulatory Visit | Attending: Internal Medicine | Admitting: Internal Medicine

## 2020-03-22 ENCOUNTER — Other Ambulatory Visit: Payer: Self-pay

## 2020-03-22 DIAGNOSIS — Z20822 Contact with and (suspected) exposure to covid-19: Secondary | ICD-10-CM | POA: Insufficient documentation

## 2020-03-22 LAB — SARS CORONAVIRUS 2 (TAT 6-24 HRS): SARS Coronavirus 2: NEGATIVE

## 2020-03-24 ENCOUNTER — Other Ambulatory Visit: Payer: Self-pay

## 2020-03-24 ENCOUNTER — Encounter
Admission: RE | Disposition: A | Discharge status: Home / Self Care | Payer: Self-pay | Source: Home / Self Care | Attending: Internal Medicine

## 2020-03-24 ENCOUNTER — Encounter: Payer: Self-pay | Admitting: Internal Medicine

## 2020-03-24 ENCOUNTER — Ambulatory Visit
Admission: RE | Admit: 2020-03-24 | Discharge: 2020-03-24 | Disposition: A | Discharge status: Home / Self Care | Payer: BLUE CROSS/BLUE SHIELD | Attending: Internal Medicine | Admitting: Internal Medicine

## 2020-03-24 DIAGNOSIS — F17210 Nicotine dependence, cigarettes, uncomplicated: Secondary | ICD-10-CM | POA: Diagnosis not present

## 2020-03-24 DIAGNOSIS — I25118 Atherosclerotic heart disease of native coronary artery with other forms of angina pectoris: Secondary | ICD-10-CM | POA: Insufficient documentation

## 2020-03-24 DIAGNOSIS — R079 Chest pain, unspecified: Secondary | ICD-10-CM

## 2020-03-24 DIAGNOSIS — R9430 Abnormal result of cardiovascular function study, unspecified: Secondary | ICD-10-CM

## 2020-03-24 DIAGNOSIS — I209 Angina pectoris, unspecified: Secondary | ICD-10-CM | POA: Diagnosis present

## 2020-03-24 HISTORY — PX: LEFT HEART CATH AND CORONARY ANGIOGRAPHY: CATH118249

## 2020-03-24 LAB — GLUCOSE, CAPILLARY
Glucose-Capillary: 134 mg/dL — ABNORMAL HIGH (ref 70–99)
Glucose-Capillary: 130 mg/dL — ABNORMAL HIGH (ref 70–99)

## 2020-03-24 SURGERY — LEFT HEART CATH AND CORONARY ANGIOGRAPHY
Anesthesia: Moderate Sedation | Laterality: Left

## 2020-03-24 MED ORDER — ASPIRIN 81 MG PO CHEW
81.0000 mg | CHEWABLE_TABLET | ORAL | Status: AC
  Administered 2020-03-24: 81 mg via ORAL

## 2020-03-24 MED ORDER — SODIUM CHLORIDE 0.9% FLUSH: 3.0000 mL | Freq: Two times a day (BID) | INTRAVENOUS | Status: DC

## 2020-03-24 MED ORDER — SODIUM CHLORIDE 0.9 % IV SOLN: 250.0000 mL | INTRAVENOUS | Status: DC | PRN

## 2020-03-24 MED ORDER — HEPARIN (PORCINE) IN NACL 1000-0.9 UT/500ML-% IV SOLN
INTRAVENOUS | Status: DC | PRN
  Administered 2020-03-24: 500 mL

## 2020-03-24 MED ORDER — ASPIRIN 81 MG PO CHEW
CHEWABLE_TABLET | ORAL | Status: AC
  Filled 2020-03-24: qty 1

## 2020-03-24 MED ORDER — ONDANSETRON HCL 4 MG/2ML IJ SOLN: 4.0000 mg | Freq: Four times a day (QID) | INTRAMUSCULAR | Status: DC | PRN

## 2020-03-24 MED ORDER — HYDRALAZINE HCL 20 MG/ML IJ SOLN: 10.0000 mg | INTRAMUSCULAR | Status: DC | PRN

## 2020-03-24 MED ORDER — HEPARIN (PORCINE) IN NACL 1000-0.9 UT/500ML-% IV SOLN
INTRAVENOUS | Status: AC
  Filled 2020-03-24: qty 1000

## 2020-03-24 MED ORDER — FENTANYL CITRATE (PF) 100 MCG/2ML IJ SOLN
INTRAMUSCULAR | Status: AC
  Filled 2020-03-24: qty 2

## 2020-03-24 MED ORDER — LABETALOL HCL 5 MG/ML IV SOLN: 10.0000 mg | INTRAVENOUS | Status: DC | PRN

## 2020-03-24 MED ORDER — MIDAZOLAM HCL 2 MG/2ML IJ SOLN
INTRAMUSCULAR | Status: AC
  Filled 2020-03-24: qty 2

## 2020-03-24 MED ORDER — MIDAZOLAM HCL 2 MG/2ML IJ SOLN
INTRAMUSCULAR | Status: DC | PRN
  Administered 2020-03-24 (×2): 1 mg via INTRAVENOUS

## 2020-03-24 MED ORDER — SODIUM CHLORIDE 0.9 % WEIGHT BASED INFUSION: 1.0000 mL/kg/h | INTRAVENOUS | Status: DC

## 2020-03-24 MED ORDER — SODIUM CHLORIDE 0.9% FLUSH: 3.0000 mL | INTRAVENOUS | Status: DC | PRN

## 2020-03-24 MED ORDER — IOHEXOL 300 MG/ML  SOLN
INTRAMUSCULAR | Status: DC | PRN
  Administered 2020-03-24: 80 mL

## 2020-03-24 MED ORDER — FENTANYL CITRATE (PF) 100 MCG/2ML IJ SOLN
INTRAMUSCULAR | Status: DC | PRN
Start: 2020-03-24 — End: 2020-03-24
  Administered 2020-03-24: 50 ug via INTRAVENOUS

## 2020-03-24 MED ORDER — SODIUM CHLORIDE 0.9 % WEIGHT BASED INFUSION
3.0000 mL/kg/h | INTRAVENOUS | Status: AC
  Administered 2020-03-24: 3 mL/kg/h via INTRAVENOUS

## 2020-03-24 MED ORDER — ACETAMINOPHEN 325 MG PO TABS: 650.0000 mg | ORAL_TABLET | ORAL | Status: DC | PRN

## 2020-03-24 SURGICAL SUPPLY — 10 items
CATH INFINITI 5FR ANG PIGTAIL (CATHETERS) ×2 IMPLANT
CATH INFINITI 5FR JL4 (CATHETERS) ×2 IMPLANT
CATH INFINITI JR4 5F (CATHETERS) ×2 IMPLANT
DEVICE CLOSURE MYNXGRIP 5F (Vascular Products) ×2 IMPLANT
KIT MANI 3VAL PERCEP (MISCELLANEOUS) ×3 IMPLANT
NDL PERC 18GX7CM (NEEDLE) IMPLANT
NEEDLE PERC 18GX7CM (NEEDLE) ×3 IMPLANT
PACK CARDIAC CATH (CUSTOM PROCEDURE TRAY) ×3 IMPLANT
SHEATH AVANTI 5FR X 11CM (SHEATH) ×2 IMPLANT
WIRE GUIDERIGHT .035X150 (WIRE) ×2 IMPLANT

## 2020-03-27 NOTE — Progress Notes (Unsigned)
Scanned results for 6-minute walk test on 02/21/20.

## 2020-03-27 NOTE — Progress Notes (Unsigned)
Scanned results for 6-minute walk test for 02/21/20.[name]

## 2020-04-03 ENCOUNTER — Other Ambulatory Visit: Payer: Self-pay

## 2020-04-03 NOTE — Progress Notes (Unsigned)
Scanned in 6 minute walk.

## 2020-04-03 NOTE — Progress Notes (Signed)
Scanned in 6 min walk and added order

## 2020-04-14 ENCOUNTER — Other Ambulatory Visit: Payer: Self-pay

## 2020-04-14 ENCOUNTER — Ambulatory Visit: Payer: BLUE CROSS/BLUE SHIELD

## 2020-04-14 DIAGNOSIS — R0602 Shortness of breath: Secondary | ICD-10-CM

## 2020-04-14 DIAGNOSIS — R0609 Other forms of dyspnea: Secondary | ICD-10-CM

## 2020-04-24 DIAGNOSIS — I10 Essential (primary) hypertension: Secondary | ICD-10-CM | POA: Insufficient documentation

## 2020-04-24 DIAGNOSIS — E782 Mixed hyperlipidemia: Secondary | ICD-10-CM | POA: Insufficient documentation

## 2020-04-24 DIAGNOSIS — I2510 Atherosclerotic heart disease of native coronary artery without angina pectoris: Secondary | ICD-10-CM | POA: Insufficient documentation

## 2020-05-04 ENCOUNTER — Encounter: Payer: BLUE CROSS/BLUE SHIELD | Admitting: Hospice and Palliative Medicine

## 2020-05-04 ENCOUNTER — Telehealth: Payer: Self-pay

## 2020-05-05 ENCOUNTER — Telehealth: Payer: Self-pay

## 2020-05-05 NOTE — Telephone Encounter (Signed)
xalled and LMOM to let pt know to come to our office on Monday 05/08/2020 at 830 am and to call us once she is in parking lot.  Advised if she cant come for appt to call us at 830 am Monday.

## 2020-05-05 NOTE — Telephone Encounter (Signed)
-----   Message from Luiz Ochoa, NP sent at 05/04/2020  7:37 PM EST ----- I need to see patient to be able to fully assess her such as listening to her lungs before I sent anything to her pharmacy. Not to be rude but she should have kept her appt today. Remember also we are no longer doing virtual appts so she will need to be seen in person.

## 2020-05-08 ENCOUNTER — Ambulatory Visit: Payer: BLUE CROSS/BLUE SHIELD | Admitting: Nurse Practitioner

## 2020-05-15 NOTE — Telephone Encounter (Signed)
Pt needs appt

## 2020-06-05 ENCOUNTER — Other Ambulatory Visit: Payer: Self-pay

## 2020-06-05 ENCOUNTER — Encounter: Payer: Self-pay | Admitting: Emergency Medicine

## 2020-06-05 ENCOUNTER — Emergency Department: Payer: BLUE CROSS/BLUE SHIELD

## 2020-06-05 ENCOUNTER — Emergency Department
Admission: EM | Admit: 2020-06-05 | Discharge: 2020-06-05 | Disposition: A | Discharge status: Home / Self Care | Payer: BLUE CROSS/BLUE SHIELD | Attending: Emergency Medicine | Admitting: Emergency Medicine

## 2020-06-05 DIAGNOSIS — Z20822 Contact with and (suspected) exposure to covid-19: Secondary | ICD-10-CM | POA: Insufficient documentation

## 2020-06-05 DIAGNOSIS — R059 Cough, unspecified: Secondary | ICD-10-CM | POA: Insufficient documentation

## 2020-06-05 DIAGNOSIS — Z7951 Long term (current) use of inhaled steroids: Secondary | ICD-10-CM | POA: Insufficient documentation

## 2020-06-05 DIAGNOSIS — J449 Chronic obstructive pulmonary disease, unspecified: Secondary | ICD-10-CM | POA: Insufficient documentation

## 2020-06-05 DIAGNOSIS — E119 Type 2 diabetes mellitus without complications: Secondary | ICD-10-CM | POA: Diagnosis not present

## 2020-06-05 DIAGNOSIS — Z79899 Other long term (current) drug therapy: Secondary | ICD-10-CM | POA: Insufficient documentation

## 2020-06-05 DIAGNOSIS — Z7982 Long term (current) use of aspirin: Secondary | ICD-10-CM | POA: Insufficient documentation

## 2020-06-05 DIAGNOSIS — F17210 Nicotine dependence, cigarettes, uncomplicated: Secondary | ICD-10-CM | POA: Insufficient documentation

## 2020-06-05 DIAGNOSIS — J302 Other seasonal allergic rhinitis: Secondary | ICD-10-CM

## 2020-06-05 LAB — RESP PANEL BY RT-PCR (FLU A&B, COVID) ARPGX2
Influenza A by PCR: NEGATIVE
Influenza B by PCR: NEGATIVE
SARS Coronavirus 2 by RT PCR: NEGATIVE

## 2020-06-05 LAB — GROUP A STREP BY PCR: Group A Strep by PCR: NOT DETECTED

## 2020-06-05 MED ORDER — PREDNISONE 20 MG PO TABS: 40.0000 mg | ORAL_TABLET | Freq: Every day | ORAL | 0 refills | Status: AC

## 2020-06-05 MED ORDER — BENZONATATE 100 MG PO CAPS: ORAL_CAPSULE | ORAL | 0 refills | Status: DC

## 2020-06-05 MED ORDER — FLUTICASONE PROPIONATE 50 MCG/ACT NA SUSP: 2.0000 | Freq: Every day | NASAL | 2 refills | Status: DC

## 2020-06-05 NOTE — Discharge Instructions (Addendum)
Your exam, labs, and CXR are normal at this time. Take the steroid as directed. Continue to dose your home meds for COPD. Consider taking OTC Delsym for additional cough relief. Follow-up with Dr. Humphrey Rolls for continued symptoms.

## 2020-06-05 NOTE — ED Triage Notes (Signed)
Had Peridot in August. Arrives today with c/o persistent cough and sinus congestion

## 2020-06-05 NOTE — ED Notes (Signed)
Pt reports persistent cough since august when she had covid. Worsening over past month. Non-productive, congested cough. Also reports runny nose and sinus congestion since covid.   Pt reports same symptoms when initially diagnosed with covid.   Pt reports she took some cough medicine, which did not help.   Pt reports tested for covid again in early November (Neg, work exposure, tested right after exposure)  No one else in house is sick. Pt states "I wonder if I'm allergic to my dogs now"  Reports increased SOB, also hx of COPD. Mild headache. Denies n/v/d

## 2020-06-05 NOTE — ED Provider Notes (Signed)
[facility] Emergency Department Provider Note ____________________________________________  Time seen: 1600  I have reviewed the triage vital signs and the nursing notes.  HISTORY  Chief Complaint  Cough  HPI [name] is a 49 y.o. female presents herself to the ED for evaluation of persistent cough since August. Patient with history of COPD, diabetes, and seizure disorder, presents with complaint of cough since she contracted Covid back in August.  Patient uses [facility] daily as well as a nebulizer but denies any other symptomatic relief. Has not seen anybody for her symptoms. She reported a command after her cough worsened 2 weeks ago. She was also voicing concern in her case, after her ex-husband had a massive heart attack. Patient denies any interim fever, chills, or sweats. She denies any productive cough. She denies any hemoptysis, syncope, or frank chest pain.  Past Medical History:  Diagnosis Date  . Back pain   . Cancer (HCC)    + CANCER CELLS TO UTERUS  . COPD (chronic obstructive pulmonary disease) ([place])   . Diabetes mellitus without complication ([place])   . Family history of adverse reaction to anesthesia    FATHER STOPS BREATHING WITH ANESTHESIA  . Headache    HX MIGRAINES  . Seizures ([place])    1 SEIZURE  VERY STRESSED    Patient Active Problem List   Diagnosis Date Noted  . Angina pectoris ([place]) 02/29/2020  . Grade I hemorrhoids 07/27/2019  . Degenerative disc disease, lumbar 08/24/2015    Past Surgical History:  Procedure Laterality Date  . ABDOMINAL HYSTERECTOMY    . APPENDECTOMY    . BACK SURGERY    . COLONOSCOPY WITH PROPOFOL N/A 06/01/2019   Procedure: COLONOSCOPY WITH PROPOFOL;  Surgeon: Jonathon Bellows, MD;  Location: [facility] ENDOSCOPY;  Service: Gastroenterology;  Laterality: N/A;  . LEFT HEART CATH AND CORONARY ANGIOGRAPHY Left 03/24/2020   Procedure: LEFT HEART CATH AND CORONARY ANGIOGRAPHY;  Surgeon: Corey Skains, MD;   Location: [place] CV LAB;  Service: Cardiovascular;  Laterality: Left;    Prior to Admission medications   Medication Sig Start Date End Date Taking? Authorizing Provider  aspirin EC 81 MG tablet Take 81 mg by mouth daily. Swallow whole.    [provider]  atorvastatin (LIPITOR) 10 MG tablet Take 1 tablet (10 mg total) by mouth daily. 02/17/20   Luiz Ochoa, NP  benzonatate (TESSALON PERLES) 100 MG capsule Take 1-2 tabs TID prn cough 06/05/20   Nedim Oki, Dannielle Karvonen, PA-C  Cyanocobalamin (B-12 PO) Take 1 capsule by mouth daily.    [provider]  escitalopram (LEXAPRO) 10 MG tablet Take 10 mg by mouth every evening. 03/05/20   [provider]  fluticasone (FLONASE) 50 MCG/ACT nasal spray Place 2 sprays into both nostrils daily. 06/05/20   Shemeika Starzyk, Dannielle Karvonen, PA-C  hydrocortisone (ANUSOL-HC) 25 MG suppository Place 1 suppository (25 mg total) rectally 2 (two) times daily. Patient taking differently: Place 25 mg rectally 2 (two) times daily as needed for hemorrhoids.  07/27/19   Fredirick Maudlin, MD  ipratropium-albuterol (DUONEB) 0.5-2.5 (3) MG/3ML SOLN Take 3 mLs by nebulization every 6 (six) hours as needed. Patient taking differently: Take 3 mLs by nebulization 2 (two) times daily.  03/09/20   Luiz Ochoa, NP  isosorbide mononitrate (IMDUR) 30 MG 24 hr tablet Take 30 mg by mouth daily.  09/10/19 09/09/20  [provider]  metoprolol tartrate (LOPRESSOR) 25 MG tablet Take 25 mg by mouth daily.  [provider]  mometasone-formoterol (DULERA) 200-5 MCG/ACT AERO Inhale 2 puffs into the lungs 2 (two) times daily. 02/21/20   Lavera Guise, MD  nicotine (NICODERM CQ - DOSED IN MG/24 HR) 7 mg/24hr patch Place 7 mg onto the skin daily as needed (nicotine dependence).    [provider]  predniSONE (DELTASONE) 20 MG tablet Take 2 tablets (40 mg total) by mouth daily with breakfast for 5 days. 06/05/20 06/10/20  Ibraheem Voris, Dannielle Karvonen, PA-C  venlafaxine XR (EFFEXOR XR) 37.5 MG 24 hr capsule Take 1 capsule (37.5 mg total) by mouth daily with breakfast. 02/17/20   Luiz Ochoa, NP    Allergies Chlorhexidine, Clindamycin/lincomycin, Demerol [meperidine], Morphine and related, and Sulfur  Family History  Problem Relation Age of Onset  . Breast cancer Maternal Grandmother 68    Social History Social History   Tobacco Use  . Smoking status: Current Every Day Smoker    Packs/day: 0.50    Years: 28.00    Pack years: 14.00    Types: Cigarettes  . Smokeless tobacco: Never Used  Vaping Use  . Vaping Use: Never used  Substance Use Topics  . Alcohol use: No    Alcohol/week: 0.0 standard drinks  . Drug use: No    Review of Systems  Constitutional: Negative for fever. Eyes: Negative for visual changes. ENT: Negative for sore throat. Cardiovascular: Negative for chest pain. Respiratory: Negative for shortness of breath. Reports cough as above Gastrointestinal: Negative for abdominal pain, vomiting and diarrhea. Genitourinary: Negative for dysuria. Musculoskeletal: Negative for back pain. Skin: Negative for rash. Neurological: Negative for headaches, focal weakness or numbness. ____________________________________________  PHYSICAL EXAM:  VITAL SIGNS: ED Triage Vitals  Enc Vitals Group     BP 06/05/20 1438 126/76     Pulse Rate 06/05/20 1438 81     Resp 06/05/20 1438 17     Temp 06/05/20 1438 97.9 F (36.6 C)     Temp Source 06/05/20 1438 Oral     SpO2 06/05/20 1438 100 %     Weight 06/05/20 1346 160 lb 0.9 oz (72.6 kg)     Height 06/05/20 1346 5' (1.524 m)     Head Circumference --      Peak Flow --      Pain Score 06/05/20 1346 0     Pain Loc --      Pain Edu? --      Excl. in [place]? --     Constitutional: Alert and oriented. Well appearing and in no distress. Head: Normocephalic and atraumatic. Eyes: Conjunctivae are normal. Normal extraocular movements Ears: Canals clear. TMs intact  bilaterally. Cardiovascular: Normal rate, regular rhythm. Normal distal pulses. Respiratory: Normal respiratory effort. No wheezes/rales/rhonchi. Gastrointestinal: Soft and nontender. No distention. Musculoskeletal: Nontender with normal range of motion in all extremities.  Neurologic:  Normal gait without ataxia. Normal speech and language. No gross focal neurologic deficits are appreciated. Skin:  Skin is warm, dry and intact. No rash noted. Psychiatric: Mood and affect are normal. Patient exhibits appropriate insight and judgment. ____________________________________________   LABS (pertinent positives/negatives)  Labs Reviewed  RESP PANEL BY RT-PCR (FLU A&B, COVID) ARPGX2  GROUP A STREP BY PCR  ____________________________________________   RADIOLOGY  CXR  Negative ____________________________________________  PROCEDURES  Procedures ____________________________________________  INITIAL IMPRESSION / ASSESSMENT AND PLAN / ED COURSE  DDX: bronchitis, COPD flare, viral URI, strep pharyngitis  Patient with ED evaluation of persistent cough since contracting COVID in August. She has been stable  and presents today without respiratory distress or toxic appearance. She has been screened for COVID, flu, and strep. All of her tests, and her CXR are negative and reassuring. She is discharged with instructions to take her home meds, along with the prescription steroid, Flonase, and benzonatate. She will follow-up with her provider as needed.   [name] was evaluated in Emergency Department on 06/05/2020 for the symptoms described in the history of present illness. She was evaluated in the context of the global COVID-19 pandemic, which necessitated consideration that the patient might be at risk for infection with the SARS-CoV-2 virus that causes COVID-19. Institutional protocols and algorithms that pertain to the evaluation of patients at risk for COVID-19 are in a state of rapid  change based on information released by regulatory bodies including the CDC and federal and state organizations. These policies and algorithms were followed during the patient's care in the ED. ____________________________________________  FINAL CLINICAL IMPRESSION(S) / ED DIAGNOSES  Final diagnoses:  Cough      Carmie End, Dannielle Karvonen, PA-C 06/05/20 1758    Merlyn Lot, MD 06/05/20 [phone]

## 2020-06-12 ENCOUNTER — Other Ambulatory Visit: Payer: Self-pay

## 2020-06-12 DIAGNOSIS — Z7982 Long term (current) use of aspirin: Secondary | ICD-10-CM | POA: Insufficient documentation

## 2020-06-12 DIAGNOSIS — B029 Zoster without complications: Secondary | ICD-10-CM | POA: Insufficient documentation

## 2020-06-12 DIAGNOSIS — Z7951 Long term (current) use of inhaled steroids: Secondary | ICD-10-CM | POA: Diagnosis not present

## 2020-06-12 DIAGNOSIS — J449 Chronic obstructive pulmonary disease, unspecified: Secondary | ICD-10-CM | POA: Diagnosis not present

## 2020-06-12 DIAGNOSIS — R519 Headache, unspecified: Secondary | ICD-10-CM | POA: Diagnosis not present

## 2020-06-12 DIAGNOSIS — E119 Type 2 diabetes mellitus without complications: Secondary | ICD-10-CM | POA: Diagnosis not present

## 2020-06-12 DIAGNOSIS — F17210 Nicotine dependence, cigarettes, uncomplicated: Secondary | ICD-10-CM | POA: Diagnosis not present

## 2020-06-12 DIAGNOSIS — Z8542 Personal history of malignant neoplasm of other parts of uterus: Secondary | ICD-10-CM | POA: Diagnosis not present

## 2020-06-12 DIAGNOSIS — R21 Rash and other nonspecific skin eruption: Secondary | ICD-10-CM | POA: Diagnosis present

## 2020-06-12 NOTE — ED Triage Notes (Signed)
Pt has rash to right side of head states causing a headache. Pt has hx of shingles states feels the same.

## 2020-06-13 ENCOUNTER — Emergency Department
Admission: EM | Admit: 2020-06-13 | Discharge: 2020-06-13 | Disposition: A | Discharge status: Home / Self Care | Payer: BLUE CROSS/BLUE SHIELD | Attending: Emergency Medicine | Admitting: Emergency Medicine

## 2020-06-13 DIAGNOSIS — R21 Rash and other nonspecific skin eruption: Secondary | ICD-10-CM

## 2020-06-13 DIAGNOSIS — B029 Zoster without complications: Secondary | ICD-10-CM

## 2020-06-13 MED ORDER — PREDNISONE 20 MG PO TABS: 40.0000 mg | ORAL_TABLET | Freq: Every day | ORAL | 0 refills | Status: DC

## 2020-06-13 MED ORDER — VALACYCLOVIR HCL 1 G PO TABS: 1000.0000 mg | ORAL_TABLET | Freq: Three times a day (TID) | ORAL | 0 refills | Status: AC

## 2020-06-13 NOTE — ED Provider Notes (Signed)
[facility] Emergency Department Provider Note  Time seen: 3:40 AM  I have reviewed the triage vital signs and the nursing notes.   HISTORY  Chief Complaint Rash and Headache   HPI [name] is a 49 y.o. female with a past medical history of COPD, diabetes, presents to the emergency department for rash to the right side of her head.  According to the patient for the past 3 days she has had a rash to the right side of her forehead.  Patient states mild headache as well which she relates due to the rash.  Denies any fever.  No weakness or numbness.  Patient states a history of shingles to the same area many years ago.    Past Medical History:  Diagnosis Date  . Back pain   . Cancer (HCC)    + CANCER CELLS TO UTERUS  . COPD (chronic obstructive pulmonary disease) ([place])   . Diabetes mellitus without complication ([place])   . Family history of adverse reaction to anesthesia    FATHER STOPS BREATHING WITH ANESTHESIA  . Headache    HX MIGRAINES  . Seizures ([place])    1 SEIZURE  VERY STRESSED    Patient Active Problem List   Diagnosis Date Noted  . Angina pectoris ([place]) 02/29/2020  . Grade I hemorrhoids 07/27/2019  . Degenerative disc disease, lumbar 08/24/2015    Past Surgical History:  Procedure Laterality Date  . ABDOMINAL HYSTERECTOMY    . APPENDECTOMY    . BACK SURGERY    . COLONOSCOPY WITH PROPOFOL N/A 06/01/2019   Procedure: COLONOSCOPY WITH PROPOFOL;  Surgeon: Jonathon Bellows, MD;  Location: [facility] ENDOSCOPY;  Service: Gastroenterology;  Laterality: N/A;  . LEFT HEART CATH AND CORONARY ANGIOGRAPHY Left 03/24/2020   Procedure: LEFT HEART CATH AND CORONARY ANGIOGRAPHY;  Surgeon: Corey Skains, MD;  Location: [place] CV LAB;  Service: Cardiovascular;  Laterality: Left;    Prior to Admission medications   Medication Sig Start Date End Date Taking? Authorizing Provider  aspirin EC 81 MG tablet Take 81 mg by mouth daily. Swallow whole.     [provider]  atorvastatin (LIPITOR) 10 MG tablet Take 1 tablet (10 mg total) by mouth daily. 02/17/20   Luiz Ochoa, NP  benzonatate (TESSALON PERLES) 100 MG capsule Take 1-2 tabs TID prn cough 06/05/20   Menshew, Dannielle Karvonen, PA-C  Cyanocobalamin (B-12 PO) Take 1 capsule by mouth daily.    [provider]  escitalopram (LEXAPRO) 10 MG tablet Take 10 mg by mouth every evening. 03/05/20   [provider]  fluticasone (FLONASE) 50 MCG/ACT nasal spray Place 2 sprays into both nostrils daily. 06/05/20   Menshew, Dannielle Karvonen, PA-C  hydrocortisone (ANUSOL-HC) 25 MG suppository Place 1 suppository (25 mg total) rectally 2 (two) times daily. Patient taking differently: Place 25 mg rectally 2 (two) times daily as needed for hemorrhoids.  07/27/19   Fredirick Maudlin, MD  ipratropium-albuterol (DUONEB) 0.5-2.5 (3) MG/3ML SOLN Take 3 mLs by nebulization every 6 (six) hours as needed. Patient taking differently: Take 3 mLs by nebulization 2 (two) times daily.  03/09/20   Luiz Ochoa, NP  isosorbide mononitrate (IMDUR) 30 MG 24 hr tablet Take 30 mg by mouth daily.  09/10/19 09/09/20  [provider]  metoprolol tartrate (LOPRESSOR) 25 MG tablet Take 25 mg by mouth daily.     [provider]  mometasone-formoterol (DULERA) 200-5 MCG/ACT AERO Inhale 2 puffs into the lungs 2 (  two) times daily. 02/21/20   Lavera Guise, MD  nicotine (NICODERM CQ - DOSED IN MG/24 HR) 7 mg/24hr patch Place 7 mg onto the skin daily as needed (nicotine dependence).    [provider]  venlafaxine XR (EFFEXOR XR) 37.5 MG 24 hr capsule Take 1 capsule (37.5 mg total) by mouth daily with breakfast. 02/17/20   Luiz Ochoa, NP    Allergies  Allergen Reactions  . Chlorhexidine Itching  . Clindamycin/Lincomycin Rash  . Demerol [Meperidine] Rash  . Morphine And Related Hives and Rash  . Sulfur Rash    Family History  Problem Relation Age of Onset  . Breast cancer  Maternal Grandmother 93    Social History Social History   Tobacco Use  . Smoking status: Current Every Day Smoker    Packs/day: 0.50    Years: 28.00    Pack years: 14.00    Types: Cigarettes  . Smokeless tobacco: Never Used  Vaping Use  . Vaping Use: Never used  Substance Use Topics  . Alcohol use: No    Alcohol/week: 0.0 standard drinks  . Drug use: No    Review of Systems Constitutional: Negative for fever. Eyes: No visual changes or eye irritation Cardiovascular: Negative for chest pain. Respiratory: Negative for shortness of breath.  Chronic cough. Gastrointestinal: Negative for abdominal pain Musculoskeletal: Negative for musculoskeletal complaints Skin: Rash to her right forehead/temple Neurological: Mild headache All other ROS negative  ____________________________________________   PHYSICAL EXAM:  VITAL SIGNS: ED Triage Vitals  Enc Vitals Group     BP 06/13/20 0019 (!) 142/98     Pulse Rate 06/13/20 0019 78     Resp 06/13/20 0019 18     Temp 06/12/20 2037 98.8 F (37.1 C)     Temp Source 06/12/20 2037 Oral     SpO2 06/13/20 0019 100 %     Weight 06/12/20 2037 165 lb (74.8 kg)     Height 06/12/20 2037 5\' 4"  (1.626 m)     Head Circumference --      Peak Flow --      Pain Score 06/12/20 2037 6     Pain Loc --      Pain Edu? --      Excl. in [place]? --    Constitutional: Alert and oriented. Well appearing and in no distress. Eyes: Normal exam ENT      Head: Patient has a rash to her right temple/right forehead that appears to be blistering.      Mouth/Throat: Mucous membranes are moist. Cardiovascular: Normal rate, regular rhythm. Respiratory: Normal respiratory effort without tachypnea nor retractions. Breath sounds are clear Gastrointestinal: Soft and nontender. No distention.   Musculoskeletal: Nontender with normal range of motion in all extremities. Neurologic:  Normal speech and language. No gross focal neurologic deficits Skin:  Skin is warm.   Small amount of blistering type rash to right temple.  Mild tenderness palpation. Psychiatric: Mood and affect are normal.   ____________________________________________   INITIAL IMPRESSION / ASSESSMENT AND PLAN / ED COURSE  Pertinent labs & imaging results that were available during my care of the patient were reviewed by me and considered in my medical decision making (see chart for details).   Patient has small area of blistering rash to her right temple.  Does not extend near the eye.  Has been present for 3 days.  States mild headache.  Patient states a history of shingles many years ago to the same area.  Could  very possibly be recurrence of shingles.  I discussed with the patient a trial of prednisone and valacyclovir as well as follow-up with her doctor for recheck/reevaluation.  Patient agreeable to plan of care.  Aidyn Sportsman was evaluated in Emergency Department on 06/13/2020 for the symptoms described in the history of present illness. She was evaluated in the context of the global COVID-19 pandemic, which necessitated consideration that the patient might be at risk for infection with the SARS-CoV-2 virus that causes COVID-19. Institutional protocols and algorithms that pertain to the evaluation of patients at risk for COVID-19 are in a state of rapid change based on information released by regulatory bodies including the CDC and federal and state organizations. These policies and algorithms were followed during the patient's care in the ED.  ____________________________________________   FINAL CLINICAL IMPRESSION(S) / ED DIAGNOSES  Shingles   Harvest Dark, MD 06/13/20 [phone]

## 2020-06-13 NOTE — ED Notes (Signed)
Signature pad unavailable at time of d/c. Pt given verbal and written d/c instructions at time of d/c. Pt ambulatory at time of discharge.

## 2020-06-13 NOTE — Discharge Instructions (Addendum)
You have been seen in the emergency department for rash.  As we discussed the rash could be consistent with shingles.  Please take your steroids and antiviral medications as prescribed.  Please follow-up with your doctor for recheck in several days.  Return to the emergency department for any significant worsening of the rash, any changes in your vision or eye involvement, or any other symptom personally concerning to yourself.

## 2020-06-13 NOTE — ED Notes (Signed)
ED Provider at bedside. 

## 2020-06-15 ENCOUNTER — Encounter: Payer: Self-pay | Admitting: Hospice and Palliative Medicine

## 2020-06-15 ENCOUNTER — Other Ambulatory Visit: Payer: Self-pay

## 2020-06-15 ENCOUNTER — Ambulatory Visit (INDEPENDENT_AMBULATORY_CARE_PROVIDER_SITE_OTHER): Payer: BLUE CROSS/BLUE SHIELD | Admitting: Hospice and Palliative Medicine

## 2020-06-15 VITALS — BP 134/88 | HR 92 | Temp 97.9°F | Resp 16 | Ht 64.0 in | Wt 165.0 lb

## 2020-06-15 DIAGNOSIS — E782 Mixed hyperlipidemia: Secondary | ICD-10-CM

## 2020-06-15 DIAGNOSIS — B0229 Other postherpetic nervous system involvement: Secondary | ICD-10-CM | POA: Diagnosis not present

## 2020-06-15 DIAGNOSIS — Z124 Encounter for screening for malignant neoplasm of cervix: Secondary | ICD-10-CM

## 2020-06-15 DIAGNOSIS — R300 Dysuria: Secondary | ICD-10-CM

## 2020-06-15 DIAGNOSIS — Z113 Encounter for screening for infections with a predominantly sexual mode of transmission: Secondary | ICD-10-CM

## 2020-06-15 DIAGNOSIS — G4734 Idiopathic sleep related nonobstructive alveolar hypoventilation: Secondary | ICD-10-CM | POA: Diagnosis not present

## 2020-06-15 DIAGNOSIS — Z0001 Encounter for general adult medical examination with abnormal findings: Secondary | ICD-10-CM

## 2020-06-15 DIAGNOSIS — R1012 Left upper quadrant pain: Secondary | ICD-10-CM

## 2020-06-15 MED ORDER — OXYCODONE-ACETAMINOPHEN 5-325 MG PO TABS: 1.0000 | ORAL_TABLET | Freq: Two times a day (BID) | ORAL | 0 refills | Status: DC | PRN

## 2020-06-15 MED ORDER — GABAPENTIN 100 MG PO CAPS: 100.0000 mg | ORAL_CAPSULE | Freq: Three times a day (TID) | ORAL | 0 refills | Status: DC

## 2020-06-15 NOTE — Progress Notes (Signed)
[facility] [place], [place] [address]  Internal MEDICINE  Office Visit Note  Patient Name: [name]  [id number]  [name]  Date of Service: 06/23/2020  Chief Complaint  Patient presents with  . Annual Exam    Pt has shingles, woke up with them on Monday, right side of head/face, knot on head, ears have been popping when she blows nose, shortness of breath  . Diabetes  . COPD  . Cough     HPI Pt is here for routine health maintenance examination Recently seen in emergency department and found to have shingles, started on anti-viral treatment Breakout is on right temple and extends onto scalp--complaining of pain from breakout Had heart cath In November--normal LV and mild coronary atherosclerosis, no intervention during cath--continue with medical management  Continues to have non-specific left upper quadrant abdominal pain--testing with manu incidental findings that have required further follow-up  During a recent hospitalization she remembers being placed on oxygen therapy at night while she was sleeping, the nurse explained that her oxygen levels were dropping--she has not had any follow-up on this  PHM: Mammogram completed 2021 Colonoscopy needed every three years, last completed 2020--repeat 2023  Current Medication: Outpatient Encounter Medications as of 06/15/2020  Medication Sig  . aspirin EC 81 MG tablet Take 81 mg by mouth daily. Swallow whole.  Marland Kitchen atorvastatin (LIPITOR) 10 MG tablet Take 1 tablet (10 mg total) by mouth daily.  . benzonatate (TESSALON PERLES) 100 MG capsule Take 1-2 tabs TID prn cough  . Cyanocobalamin (B-12 PO) Take 1 capsule by mouth daily.  Marland Kitchen escitalopram (LEXAPRO) 10 MG tablet Take 10 mg by mouth every evening.  . fluticasone (FLONASE) 50 MCG/ACT nasal spray Place 2 sprays into both nostrils daily.  Marland Kitchen gabapentin (NEURONTIN) 100 MG capsule Take 1 capsule (100 mg total) by mouth 3 (three) times daily.  .  hydrocortisone (ANUSOL-HC) 25 MG suppository Place 1 suppository (25 mg total) rectally 2 (two) times daily. (Patient taking differently: Place 25 mg rectally 2 (two) times daily as needed for hemorrhoids. )  . ipratropium-albuterol (DUONEB) 0.5-2.5 (3) MG/3ML SOLN Take 3 mLs by nebulization every 6 (six) hours as needed. (Patient taking differently: Take 3 mLs by nebulization 2 (two) times daily. )  . isosorbide mononitrate (IMDUR) 30 MG 24 hr tablet Take 30 mg by mouth daily.   . metoprolol tartrate (LOPRESSOR) 25 MG tablet Take 25 mg by mouth daily.   . mometasone-formoterol (DULERA) 200-5 MCG/ACT AERO Inhale 2 puffs into the lungs 2 (two) times daily.  . nicotine (NICODERM CQ - DOSED IN MG/24 HR) 7 mg/24hr patch Place 7 mg onto the skin daily as needed (nicotine dependence).  Marland Kitchen oxyCODONE-acetaminophen (PERCOCET/ROXICET) 5-325 MG tablet Take 1 tablet by mouth 2 (two) times daily as needed for severe pain.  . predniSONE (DELTASONE) 20 MG tablet Take 2 tablets (40 mg total) by mouth daily.  . [EXPIRED] valACYclovir (VALTREX) 1000 MG tablet Take 1 tablet (1,000 mg total) by mouth 3 (three) times daily for 7 days.  Marland Kitchen venlafaxine XR (EFFEXOR XR) 37.5 MG 24 hr capsule Take 1 capsule (37.5 mg total) by mouth daily with breakfast.   No facility-administered encounter medications on file as of 06/15/2020.    Surgical History: Past Surgical History:  Procedure Laterality Date  . ABDOMINAL HYSTERECTOMY    . APPENDECTOMY    . BACK SURGERY    . COLONOSCOPY WITH PROPOFOL N/A 06/01/2019   Procedure: COLONOSCOPY WITH PROPOFOL;  Surgeon: Vicente Males,  Bailey Mech, MD;  Location: [facility];  Service: Gastroenterology;  Laterality: N/A;  . LEFT HEART CATH AND CORONARY ANGIOGRAPHY Left 03/24/2020   Procedure: LEFT HEART CATH AND CORONARY ANGIOGRAPHY;  Surgeon: Corey Skains, MD;  Location: [facility];  Service: Cardiovascular;  Laterality: Left;    Medical History: Past Medical History:  Diagnosis Date   . Back pain   . Cancer (HCC)    + CANCER CELLS TO UTERUS  . COPD (chronic obstructive pulmonary disease) ([place])   . Diabetes mellitus without complication ([place])   . Family history of adverse reaction to anesthesia    FATHER STOPS BREATHING WITH ANESTHESIA  . Headache    HX MIGRAINES  . Seizures ([place])    1 SEIZURE  VERY STRESSED    Family History: Family History  Problem Relation Age of Onset  . Breast cancer Maternal Grandmother 40      Review of Systems  Constitutional: Negative for chills, diaphoresis and fatigue.  HENT: Negative for ear pain, postnasal drip and sinus pressure.   Eyes: Negative for photophobia, discharge, redness, itching and visual disturbance.  Respiratory: Negative for cough, shortness of breath and wheezing.   Cardiovascular: Negative for chest pain, palpitations and leg swelling.  Gastrointestinal: Negative for abdominal pain, constipation, diarrhea, nausea and vomiting.  Genitourinary: Negative for dysuria and flank pain.  Musculoskeletal: Negative for arthralgias, back pain, gait problem and neck pain.  Skin: Negative for color change.  Allergic/Immunologic: Negative for environmental allergies and food allergies.  Neurological: Positive for headaches. Negative for dizziness.       Pain from recent shingles outbreak on right temple into scalp  Hematological: Does not bruise/bleed easily.  Psychiatric/Behavioral: Negative for agitation, behavioral problems (depression) and hallucinations.     Vital Signs: BP 134/88   Pulse 92   Temp 97.9 F (36.6 C)   Resp 16   Ht 5\' 4"  (1.626 m)   Wt 165 lb (74.8 kg)   SpO2 98%   BMI 28.32 kg/m    Physical Exam Vitals reviewed.  Constitutional:      Appearance: Normal appearance. She is obese.  HENT:     Right Ear: Tympanic membrane normal.     Left Ear: Tympanic membrane normal.     Nose: Nose normal.     Mouth/Throat:     Mouth: Mucous membranes are dry.     Pharynx: Oropharynx is clear.   Eyes:     Pupils: Pupils are equal, round, and reactive to light.  Cardiovascular:     Rate and Rhythm: Normal rate and regular rhythm.     Pulses: Normal pulses.     Heart sounds: Normal heart sounds.  Pulmonary:     Effort: Pulmonary effort is normal.     Breath sounds: Normal breath sounds.  Chest:  Breasts:     Right: Normal.     Left: Normal.    Abdominal:     General: Abdomen is flat.     Palpations: Abdomen is soft.  Genitourinary:    Vagina: Normal.     Cervix: Normal.     Uterus: Normal.      Adnexa: Right adnexa normal and left adnexa normal.  Musculoskeletal:        General: Normal range of motion.     Cervical back: Normal range of motion.  Skin:    General: Skin is warm.  Neurological:     General: No focal deficit present.     Mental Status: She is alert  and oriented to person, place, and time. Mental status is at baseline.  Psychiatric:        Mood and Affect: Mood normal.        Behavior: Behavior normal.        Thought Content: Thought content normal.        Judgment: Judgment normal.      LABS: Recent Results (from the past 2160 hour(s))  Resp Panel by RT-PCR (Flu A&B, Covid) Nasopharyngeal Swab     Status: None   Collection Time: 06/05/20  4:08 PM   Specimen: Nasopharyngeal Swab; Nasopharyngeal(NP) swabs in vial transport medium  Result Value Ref Range   SARS Coronavirus 2 by RT PCR NEGATIVE NEGATIVE    Comment: (NOTE) SARS-CoV-2 target nucleic acids are NOT DETECTED.  The SARS-CoV-2 RNA is generally detectable in upper respiratory specimens during the acute phase of infection. The lowest concentration of SARS-CoV-2 viral copies this assay can detect is 138 copies/mL. A negative result does not preclude SARS-Cov-2 infection and should not be used as the sole basis for treatment or other patient management decisions. A negative result may occur with  improper specimen collection/handling, submission of specimen other than nasopharyngeal  swab, presence of viral mutation(s) within the areas targeted by this assay, and inadequate number of viral copies(<138 copies/mL). A negative result must be combined with clinical observations, patient history, and epidemiological information. The expected result is Negative.  Fact Sheet for Patients:  EntrepreneurPulse.com.au  Fact Sheet for Healthcare Providers:  IncredibleEmployment.be  This test is no t yet approved or cleared by the [place] FDA and  has been authorized for detection and/or diagnosis of SARS-CoV-2 by FDA under an Emergency Use Authorization (EUA). This EUA will remain  in effect (meaning this test can be used) for the duration of the COVID-19 declaration under Section 564(b)(1) of the Act, 21 U.S.C.section 360bbb-3(b)(1), unless the authorization is terminated  or revoked sooner.       Influenza A by PCR NEGATIVE NEGATIVE   Influenza B by PCR NEGATIVE NEGATIVE    Comment: (NOTE) The Xpert Xpress SARS-CoV-2/FLU/RSV plus assay is intended as an aid in the diagnosis of influenza from Nasopharyngeal swab specimens and should not be used as a sole basis for treatment. Nasal washings and aspirates are unacceptable for Xpert Xpress SARS-CoV-2/FLU/RSV testing.  Fact Sheet for Patients: EntrepreneurPulse.com.au  Fact Sheet for Healthcare Providers: IncredibleEmployment.be  This test is not yet approved or cleared by the [place] FDA and has been authorized for detection and/or diagnosis of SARS-CoV-2 by FDA under an Emergency Use Authorization (EUA). This EUA will remain in effect (meaning this test can be used) for the duration of the COVID-19 declaration under Section 564(b)(1) of the Act, 21 U.S.C. section 360bbb-3(b)(1), unless the authorization is terminated or revoked.  Performed at [facility], [address] [place], [place] [address]   Group A Strep by PCR      Status: None   Collection Time: 06/05/20  4:31 PM   Specimen: Nasopharyngeal Swab; Sterile Swab  Result Value Ref Range   Group A Strep by PCR NOT DETECTED NOT DETECTED    Comment: Performed at [facility], [address] [place], [place] [address]  IGP, Aptima HPV     Status: None   Collection Time: 06/15/20  4:00 PM  Result Value Ref Range   Interpretation UNS     Comment: UNSATISFACTORY FOR EVALUATION.   Category UNS     Comment: Unsatisfactory   Adequacy  USLB     Comment: Specimen processed and examined, but unsatisfactory for evaluation of epithelial abnormality because of excessive lubricant.    Clinician Provided ICD10 Comment     Comment: Z12.4   Performed by: Comment     Comment: Windell Norfolk, Cytotechnologist (ASCP)   QC reviewed by: Comment     Comment: Schkita Black, Cytotechnologist (ASCP)   Note: Comment     Comment: The Pap smear is a screening test designed to aid in the detection of premalignant and malignant conditions of the uterine cervix.  It is not a diagnostic procedure and should not be used as the sole means of detecting cervical cancer.  Both false-positive and false-negative reports do occur.    Test Methodology Comment     Comment: This liquid based ThinPrep(R) pap test was screened with the use of an image guided system.    HPV Aptima Negative Negative    Comment: This nucleic acid amplification test detects fourteen high-risk HPV types (16,18,31,33,35,39,45,51,52,56,58,59,66,68) without differentiation.   NuSwab Vaginitis Plus (VG+)     Status: Abnormal   Collection Time: 06/15/20  4:25 PM  Result Value Ref Range   Atopobium vaginae High - 2 (A) Score   BVAB 2 High - 2 (A) Score   Megasphaera 1 High - 2 (A) Score    Comment: Calculate total score by adding the 3 individual bacterial vaginosis (BV) marker scores together.  Total score is interpreted as follows: Total score 0-1: Indicates the absence of BV. Total score   2:  Indeterminate for BV. Additional clinical                  data should be evaluated to establish a                  diagnosis. Total score 3-6: Indicates the presence of BV. This test was developed and its performance characteristics determined by [facility].  It has not been cleared or approved by the Food and Drug Administration.    Candida albicans, NAA Negative Negative   Candida glabrata, NAA Negative Negative   Trich vag by NAA Negative Negative   Chlamydia trachomatis, NAA Negative Negative   Neisseria gonorrhoeae, NAA Negative Negative    Assessment/Plan: 1. Encounter for routine adult health examination with abnormal findings Well appearing 49 year old female Reviewed routine labs Up to date on PHM  2. LUQ abdominal pain Will obtain US for ongoing LUQ abdominal pain-other testing negative for findings contributing to pain - US Abdomen Complete; Future  3. HZV (herpes zoster virus) post herpetic neuralgia Large affected area on right temple extending into scalp--gabapentin to help with pain and low dose Percocet for severe pain as pain is disrupting sleep and causing severe headaches--closely monitor - gabapentin (NEURONTIN) 100 MG capsule; Take 1 capsule (100 mg total) by mouth 3 (three) times daily.  Dispense: 90 capsule; Refill: 0 - oxyCODONE-acetaminophen (PERCOCET/ROXICET) 5-325 MG tablet; Take 1 tablet by mouth 2 (two) times daily as needed for severe pain.  Dispense: 14 tablet; Refill: 0  4. Nocturnal hypoxia Will review overnight pulse oximetry for possible nocturnal hypoxia - Pulse oximetry, overnight; Future  5. Mixed hyperlipidemia Discussed recent lipid levels being abnormal, continue with statin therapy but discussed importance of healthy food choices and active lifestyle to help control levels   6. Routine cervical smear - IGP, Aptima HPV - IGP, Aptima HPV  7. Screening for STD (sexually transmitted disease) - NuSwab Vaginitis Plus (VG+)  General  Counseling:  Keyundra verbalizes understanding of the findings of todays visit and agrees with plan of treatment. I have discussed any further diagnostic evaluation that may be needed or ordered today. We also reviewed her medications today. she has been encouraged to call the office with any questions or concerns that should arise related to todays visit.    Counseling:    Orders Placed This Encounter  Procedures  . US Abdomen Complete  . NuSwab Vaginitis Plus (VG+)  . Pulse oximetry, overnight    Meds ordered this encounter  Medications  . gabapentin (NEURONTIN) 100 MG capsule    Sig: Take 1 capsule (100 mg total) by mouth 3 (three) times daily.    Dispense:  90 capsule    Refill:  0  . oxyCODONE-acetaminophen (PERCOCET/ROXICET) 5-325 MG tablet    Sig: Take 1 tablet by mouth 2 (two) times daily as needed for severe pain.    Dispense:  14 tablet    Refill:  0    Total time spent: 30 Minutes  Time spent includes review of chart, medications, test results, and follow up plan with the patient.   This patient was seen by Casey Burkitt AGNP-C Collaboration with Dr Lavera Guise as a part of collaborative care agreement   Tanna Furry. [facility]

## 2020-06-18 ENCOUNTER — Other Ambulatory Visit: Payer: Self-pay | Admitting: Hospice and Palliative Medicine

## 2020-06-18 MED ORDER — METRONIDAZOLE 500 MG PO TABS: 500.0000 mg | ORAL_TABLET | Freq: Two times a day (BID) | ORAL | 0 refills | Status: DC

## 2020-06-18 NOTE — Progress Notes (Signed)
Please call the patient and let her know she has tested positive for bacterial vaginosis (BV), I have sent metronidazole to her pharmacy for treatment, please advise she must avoid alcohol while taking this medication. Thanks!

## 2020-06-18 NOTE — Progress Notes (Signed)
Metronidazole sent to pharmacy for treatment of BV on recent PAP.

## 2020-06-19 ENCOUNTER — Telehealth: Payer: Self-pay

## 2020-06-19 LAB — NUSWAB VAGINITIS PLUS (VG+)
Atopobium vaginae: HIGH Score — AB
BVAB 2: HIGH Score — AB
Candida albicans, NAA: NEGATIVE
Candida glabrata, NAA: NEGATIVE
Chlamydia trachomatis, NAA: NEGATIVE
Megasphaera 1: HIGH Score — AB
Neisseria gonorrhoeae, NAA: NEGATIVE
Trich vag by NAA: NEGATIVE

## 2020-06-19 NOTE — Telephone Encounter (Signed)
Spoke to pt and informed her of lab results for nuswab.  Pt informed that prescription was sent to pharmacy and that she doesn't need to drink alcohol while taking medication.

## 2020-06-20 LAB — IGP, APTIMA HPV: HPV Aptima: NEGATIVE

## 2020-06-22 ENCOUNTER — Telehealth: Payer: Self-pay

## 2020-06-23 ENCOUNTER — Encounter: Payer: Self-pay | Admitting: Hospice and Palliative Medicine

## 2020-06-27 NOTE — Telephone Encounter (Signed)
LMOM that pt can repeat pap on her next OV

## 2020-06-28 ENCOUNTER — Other Ambulatory Visit: Payer: BLUE CROSS/BLUE SHIELD

## 2020-06-29 ENCOUNTER — Telehealth: Payer: Self-pay

## 2020-06-29 NOTE — Telephone Encounter (Signed)
Billed missed appt fee 06/28/20. Antanasia Kaczynski

## 2020-06-30 ENCOUNTER — Other Ambulatory Visit: Payer: Self-pay | Admitting: Hospice and Palliative Medicine

## 2020-06-30 DIAGNOSIS — G4734 Idiopathic sleep related nonobstructive alveolar hypoventilation: Secondary | ICD-10-CM

## 2020-06-30 NOTE — Progress Notes (Signed)
Please let her know that she qualifies for overnight oxygen based on overnight oximetry results. I have placed the order and they should be contacting her soon.

## 2020-07-06 ENCOUNTER — Other Ambulatory Visit: Payer: Self-pay | Admitting: Hospice and Palliative Medicine

## 2020-07-06 ENCOUNTER — Telehealth: Payer: Self-pay

## 2020-07-06 NOTE — Telephone Encounter (Signed)
Placed orders for nocturnal oxygen in [facility]HP box to be picked up. [name]

## 2020-07-19 ENCOUNTER — Other Ambulatory Visit: Payer: BLUE CROSS/BLUE SHIELD

## 2020-07-20 ENCOUNTER — Encounter: Payer: Self-pay | Admitting: Hospice and Palliative Medicine

## 2020-07-20 ENCOUNTER — Telehealth (INDEPENDENT_AMBULATORY_CARE_PROVIDER_SITE_OTHER): Payer: BLUE CROSS/BLUE SHIELD | Admitting: Hospice and Palliative Medicine

## 2020-07-20 ENCOUNTER — Encounter: Payer: Self-pay | Admitting: Internal Medicine

## 2020-07-20 VITALS — Temp 98.8°F | Ht 64.0 in | Wt 165.0 lb

## 2020-07-20 DIAGNOSIS — R0602 Shortness of breath: Secondary | ICD-10-CM | POA: Diagnosis not present

## 2020-07-20 DIAGNOSIS — R519 Headache, unspecified: Secondary | ICD-10-CM | POA: Diagnosis not present

## 2020-07-20 DIAGNOSIS — U071 COVID-19: Secondary | ICD-10-CM | POA: Diagnosis not present

## 2020-07-20 MED ORDER — BUTALBITAL-APAP-CAFFEINE 50-325-40 MG PO TABS: 1.0000 | ORAL_TABLET | Freq: Four times a day (QID) | ORAL | 0 refills | Status: DC | PRN

## 2020-07-20 MED ORDER — PREDNISONE 10 MG PO TABS: ORAL_TABLET | ORAL | 0 refills | Status: DC

## 2020-07-20 MED ORDER — GUAIFENESIN ER 600 MG PO TB12: 600.0000 mg | ORAL_TABLET | Freq: Two times a day (BID) | ORAL | 0 refills | Status: DC

## 2020-07-20 MED ORDER — AZITHROMYCIN 250 MG PO TABS: ORAL_TABLET | ORAL | 0 refills | Status: DC

## 2020-07-20 NOTE — Progress Notes (Signed)
[facility] [place], [place] [address]  Internal MEDICINE  Telephone Visit  Patient Name: [name]  [id number]  [id number]  Date of Service: 07/22/2020  I connected with the patient at 1232 by telephone and verified the patients identity using two identifiers.   I discussed the limitations, risks, security and privacy concerns of performing an evaluation and management service by telephone and the availability of in person appointments. I also discussed with the patient that there may be a patient responsible charge related to the service.  The patient expressed understanding and agrees to proceed.    Chief Complaint  Patient presents with  . Telephone Screen  . Telephone Assessment    [phone]  . Covid Positive    yesterday  . Sinusitis  . Headache  . Sore Throat  . Generalized Body Aches  . Cough    HPI Patient is being seen via video visit for acute sick visit She took a home COVID test yesterday and tested positive She is not feeling well, c/o shortness of breath, chest congestion, fatigue, body aches and headaches Her biggest complaint are the headaches--she has tried acetaminophen, ibuprofen and even took a Percocet which did not relieve her pain She has also been wearing her oxygen she normally only wears at night during the day due to feeling short of breath    Current Medication: Outpatient Encounter Medications as of 07/20/2020  Medication Sig  . aspirin EC 81 MG tablet Take 81 mg by mouth daily. Swallow whole.  Marland Kitchen atorvastatin (LIPITOR) 10 MG tablet Take 1 tablet (10 mg total) by mouth daily.  Marland Kitchen azithromycin (ZITHROMAX Z-PAK) 250 MG tablet Take one tablet daily for 10 days.  . butalbital-acetaminophen-caffeine (FIORICET) 50-325-40 MG tablet Take 1 tablet by mouth every 6 (six) hours as needed for headache.  . Cyanocobalamin (B-12 PO) Take 1 capsule by mouth daily.  Marland Kitchen escitalopram (LEXAPRO) 10 MG tablet Take 10 mg by  mouth every evening.  . fluticasone (FLONASE) 50 MCG/ACT nasal spray Place 2 sprays into both nostrils daily.  Marland Kitchen gabapentin (NEURONTIN) 100 MG capsule Take 1 capsule (100 mg total) by mouth 3 (three) times daily.  Marland Kitchen guaiFENesin (MUCINEX) 600 MG 12 hr tablet Take 1 tablet (600 mg total) by mouth 2 (two) times daily.  . hydrocortisone (ANUSOL-HC) 25 MG suppository Place 1 suppository (25 mg total) rectally 2 (two) times daily. (Patient taking differently: Place 25 mg rectally 2 (two) times daily as needed for hemorrhoids.)  . ipratropium-albuterol (DUONEB) 0.5-2.5 (3) MG/3ML SOLN Take 3 mLs by nebulization every 6 (six) hours as needed. (Patient taking differently: Take 3 mLs by nebulization 2 (two) times daily.)  . isosorbide mononitrate (IMDUR) 30 MG 24 hr tablet Take 30 mg by mouth daily.   . metoprolol tartrate (LOPRESSOR) 25 MG tablet Take 25 mg by mouth daily.   . metroNIDAZOLE (FLAGYL) 500 MG tablet Take 1 tablet (500 mg total) by mouth 2 (two) times daily.  . mometasone-formoterol (DULERA) 200-5 MCG/ACT AERO Inhale 2 puffs into the lungs 2 (two) times daily.  . nicotine (NICODERM CQ - DOSED IN MG/24 HR) 7 mg/24hr patch Place 7 mg onto the skin daily as needed (nicotine dependence).  Marland Kitchen oxyCODONE-acetaminophen (PERCOCET/ROXICET) 5-325 MG tablet Take 1 tablet by mouth 2 (two) times daily as needed for severe pain.  . predniSONE (DELTASONE) 10 MG tablet Take 1 tablet three times a day with a meal for three for three days, take 1 tablet by twice daily  with a meal for 3 days, take 1 tablet once daily with a meal for 3 days  . venlafaxine XR (EFFEXOR XR) 37.5 MG 24 hr capsule Take 1 capsule (37.5 mg total) by mouth daily with breakfast.  . [DISCONTINUED] benzonatate (TESSALON PERLES) 100 MG capsule Take 1-2 tabs TID prn cough  . [DISCONTINUED] predniSONE (DELTASONE) 20 MG tablet Take 2 tablets (40 mg total) by mouth daily.   No facility-administered encounter medications on file as of 07/20/2020.     Surgical History: Past Surgical History:  Procedure Laterality Date  . ABDOMINAL HYSTERECTOMY    . APPENDECTOMY    . BACK SURGERY    . COLONOSCOPY WITH PROPOFOL N/A 06/01/2019   Procedure: COLONOSCOPY WITH PROPOFOL;  Surgeon: Jonathon Bellows, MD;  Location: [facility] ENDOSCOPY;  Service: Gastroenterology;  Laterality: N/A;  . LEFT HEART CATH AND CORONARY ANGIOGRAPHY Left 03/24/2020   Procedure: LEFT HEART CATH AND CORONARY ANGIOGRAPHY;  Surgeon: Corey Skains, MD;  Location: [place] [facility];  Service: Cardiovascular;  Laterality: Left;    Medical History: Past Medical History:  Diagnosis Date  . Back pain   . Cancer (HCC)    + CANCER CELLS TO UTERUS  . COPD (chronic obstructive pulmonary disease) ([place])   . Diabetes mellitus without complication ([place])   . Family history of adverse reaction to anesthesia    FATHER STOPS BREATHING WITH ANESTHESIA  . Headache    HX MIGRAINES  . Seizures ([place])    1 SEIZURE  VERY STRESSED    Family History: Family History  Problem Relation Age of Onset  . Breast cancer Maternal Grandmother 15    Social History   Socioeconomic History  . Marital status: Single    Spouse name: Not on file  . Number of children: 2  . Years of education: Not on file  . Highest education level: Not on file  Occupational History  . Not on file  Tobacco Use  . Smoking status: Current Every Day Smoker    Packs/day: 0.50    Years: 28.00    Pack years: 14.00    Types: Cigarettes  . Smokeless tobacco: Never Used  . Tobacco comment: few a day   Vaping Use  . Vaping Use: Never used  Substance and Sexual Activity  . Alcohol use: No    Alcohol/week: 0.0 standard drinks  . Drug use: No  . Sexual activity: Not on file  Other Topics Concern  . Not on file  Social History Narrative   Lives with boyfriend    Social Determinants of Health   Financial Resource Strain: Not on file  Food Insecurity: Not on file  Transportation Needs: Not on file  Physical  Activity: Not on file  Stress: Not on file  Social Connections: Not on file  Intimate Partner Violence: Not on file      Review of Systems  Constitutional: Positive for chills, fatigue and fever. Negative for diaphoresis.  HENT: Positive for congestion and sore throat. Negative for ear pain, postnasal drip and sinus pressure.   Eyes: Negative for photophobia, discharge, redness, itching and visual disturbance.  Respiratory: Positive for cough and shortness of breath. Negative for wheezing.   Cardiovascular: Negative for chest pain, palpitations and leg swelling.  Gastrointestinal: Negative for abdominal pain, constipation, diarrhea, nausea and vomiting.  Genitourinary: Negative for dysuria and flank pain.  Musculoskeletal: Negative for arthralgias, back pain, gait problem and neck pain.  Skin: Negative for color change.  Allergic/Immunologic: Negative for environmental allergies and  food allergies.  Neurological: Positive for headaches. Negative for dizziness.  Hematological: Does not bruise/bleed easily.  Psychiatric/Behavioral: Negative for agitation, behavioral problems (depression) and hallucinations.    Vital Signs: Temp 98.8 F (37.1 C)   Ht 5\' 4"  (1.626 m)   Wt 165 lb (74.8 kg)   BMI 28.32 kg/m    Observation/Objective: Appears acutely ill, lying down, alert and oriented, answers questions appropriately. No acute respiratory distress evident.   Assessment/Plan: 1. COVID-19 Treat with Mucinex as well as prednisone Azithromycin for high risk of development of PNA Advised to increase fluid intake, rest, vitamin C, D and Zinc - azithromycin (ZITHROMAX Z-PAK) 250 MG tablet; Take one tablet daily for 10 days.  Dispense: 10 each; Refill: 0 - predniSONE (DELTASONE) 10 MG tablet; Take 1 tablet three times a day with a meal for three for three days, take 1 tablet by twice daily with a meal for 3 days, take 1 tablet once daily with a meal for 3 days  Dispense: 18 tablet;  Refill: 0 - guaiFENesin (MUCINEX) 600 MG 12 hr tablet; Take 1 tablet (600 mg total) by mouth 2 (two) times daily.  Dispense: 20 tablet; Refill: 0  2. Acute nonintractable headache, unspecified headache type Headaches likely secondary to acute viral illness, try Fioricet to relieve headaches--advised not to mix with Percocet - butalbital-acetaminophen-caffeine (FIORICET) 50-325-40 MG tablet; Take 1 tablet by mouth every 6 (six) hours as needed for headache.  Dispense: 20 tablet; Refill: 0  3. Shortness of breath Advised to monitor SpO2 levels at home, will need to seek emergency care if drops and sustains below 90% Continue to wear supplemental oxygen as needed during the day and at night  General Counseling: [place] verbalizes understanding of the findings of today's phone visit and agrees with plan of treatment. I have discussed any further diagnostic evaluation that may be needed or ordered today. We also reviewed her medications today. she has been encouraged to call the office with any questions or concerns that should arise related to todays visit.  Meds ordered this encounter  Medications  . azithromycin (ZITHROMAX Z-PAK) 250 MG tablet    Sig: Take one tablet daily for 10 days.    Dispense:  10 each    Refill:  0  . predniSONE (DELTASONE) 10 MG tablet    Sig: Take 1 tablet three times a day with a meal for three for three days, take 1 tablet by twice daily with a meal for 3 days, take 1 tablet once daily with a meal for 3 days    Dispense:  18 tablet    Refill:  0  . guaiFENesin (MUCINEX) 600 MG 12 hr tablet    Sig: Take 1 tablet (600 mg total) by mouth 2 (two) times daily.    Dispense:  20 tablet    Refill:  0  . butalbital-acetaminophen-caffeine (FIORICET) 50-325-40 MG tablet    Sig: Take 1 tablet by mouth every 6 (six) hours as needed for headache.    Dispense:  20 tablet    Refill:  0     Time spent: 25 Minutes Time spent includes review of chart, medications, test results  and follow-up plan with the patient.  Tanna Furry Chanese Hartsough AGNP-C Internal medicine

## 2020-07-21 ENCOUNTER — Emergency Department: Payer: BLUE CROSS/BLUE SHIELD

## 2020-07-21 ENCOUNTER — Emergency Department
Admission: EM | Admit: 2020-07-21 | Discharge: 2020-07-21 | Disposition: A | Discharge status: Home / Self Care | Payer: BLUE CROSS/BLUE SHIELD | Attending: Emergency Medicine | Admitting: Emergency Medicine

## 2020-07-21 ENCOUNTER — Encounter: Payer: Self-pay | Admitting: Hospice and Palliative Medicine

## 2020-07-21 ENCOUNTER — Encounter: Payer: Self-pay | Admitting: *Deleted

## 2020-07-21 ENCOUNTER — Other Ambulatory Visit: Payer: Self-pay

## 2020-07-21 DIAGNOSIS — J449 Chronic obstructive pulmonary disease, unspecified: Secondary | ICD-10-CM | POA: Insufficient documentation

## 2020-07-21 DIAGNOSIS — R059 Cough, unspecified: Secondary | ICD-10-CM | POA: Diagnosis present

## 2020-07-21 DIAGNOSIS — U071 COVID-19: Secondary | ICD-10-CM | POA: Diagnosis not present

## 2020-07-21 DIAGNOSIS — Z8542 Personal history of malignant neoplasm of other parts of uterus: Secondary | ICD-10-CM | POA: Diagnosis not present

## 2020-07-21 DIAGNOSIS — F17210 Nicotine dependence, cigarettes, uncomplicated: Secondary | ICD-10-CM | POA: Insufficient documentation

## 2020-07-21 DIAGNOSIS — Z7982 Long term (current) use of aspirin: Secondary | ICD-10-CM | POA: Insufficient documentation

## 2020-07-21 DIAGNOSIS — Z7951 Long term (current) use of inhaled steroids: Secondary | ICD-10-CM | POA: Diagnosis not present

## 2020-07-21 DIAGNOSIS — J028 Acute pharyngitis due to other specified organisms: Secondary | ICD-10-CM | POA: Diagnosis not present

## 2020-07-21 DIAGNOSIS — E119 Type 2 diabetes mellitus without complications: Secondary | ICD-10-CM | POA: Diagnosis not present

## 2020-07-21 DIAGNOSIS — B9789 Other viral agents as the cause of diseases classified elsewhere: Secondary | ICD-10-CM | POA: Insufficient documentation

## 2020-07-21 LAB — BASIC METABOLIC PANEL
Anion gap: 12 (ref 5–15)
BUN: 8 mg/dL (ref 6–20)
CO2: 20 mmol/L — ABNORMAL LOW (ref 22–32)
Calcium: 9.3 mg/dL (ref 8.9–10.3)
Chloride: 106 mmol/L (ref 98–111)
Creatinine, Ser: 0.72 mg/dL (ref 0.44–1.00)
GFR, Estimated: >60 mL/min (ref >60)
Glucose, Bld: 169 mg/dL — ABNORMAL HIGH (ref 70–99)
Potassium: 4.5 mmol/L (ref 3.5–5.1)
Sodium: 138 mmol/L (ref 135–145)

## 2020-07-21 LAB — CBC
HCT: 41.1 % (ref 36.0–46.0)
Hemoglobin: 13.9 g/dL (ref 12.0–15.0)
MCH: 32.4 pg (ref 26.0–34.0)
MCHC: 33.8 g/dL (ref 30.0–36.0)
MCV: 95.8 fL (ref 80.0–100.0)
Platelets: 247 10*3/uL (ref 150–400)
RBC: 4.29 MIL/uL (ref 3.87–5.11)
RDW: 12.9 % (ref 11.5–15.5)
WBC: 5.2 10*3/uL (ref 4.0–10.5)
nRBC: 0 % (ref 0.0–0.2)

## 2020-07-21 LAB — TROPONIN I (HIGH SENSITIVITY): Troponin I (High Sensitivity): 2 ng/L (ref <18)

## 2020-07-21 MED ORDER — DEXAMETHASONE 6 MG PO TABS
10.0000 mg | ORAL_TABLET | Freq: Once | ORAL | Status: AC
  Administered 2020-07-21: 10 mg via ORAL
  Filled 2020-07-21: qty 1

## 2020-07-21 MED ORDER — LIDOCAINE VISCOUS HCL 2 % MT SOLN: 15.0000 mL | OROMUCOSAL | 0 refills | Status: DC | PRN

## 2020-07-21 MED ORDER — LIDOCAINE VISCOUS HCL 2 % MT SOLN
15.0000 mL | Freq: Once | OROMUCOSAL | Status: AC
  Administered 2020-07-21: 15 mL via OROMUCOSAL
  Filled 2020-07-21: qty 15

## 2020-07-21 MED ORDER — IBUPROFEN 800 MG PO TABS
800.0000 mg | ORAL_TABLET | Freq: Once | ORAL | Status: AC
  Administered 2020-07-21: 800 mg via ORAL
  Filled 2020-07-21: qty 1

## 2020-07-21 NOTE — Discharge Instructions (Signed)
Please quarantine for 10 days after the onset of symptoms.  You should isolate from others that you live with as well.  Any close contacts that have seen you in the past 2 days before your symptoms have started will need to be notified and they will need to quarantine for 14 days.  COVID-19 is a viral illness.  You do not need antibiotics.  You may alternate Tylenol 1000 mg every 6 hours as needed for pain, fever and Ibuprofen 800 mg every 8 hours as needed for pain, fever.  Please take Ibuprofen with food.  Do not take more than 4000 mg of Tylenol (acetaminophen) in a 24 hour period.  If you develop chest pain, difficulty breathing, have blue lips or fingertips, begin vomiting and can not stop and can not hold down fluids, feel like you may pass out or you do pass out, have confusion, please return to the emergency department.  Given you feel like your throat is closing and you are having itching, I recommend that you stop taking azithromycin.  You may continue your prednisone.  You may use warm salt water gargles to help with sore throat.  You may use over-the-counter Chloraseptic spray.  You do not need antibiotics at this time.  Your symptoms are likely due to COVID-19 which is a viral illness.

## 2020-07-21 NOTE — ED Provider Notes (Signed)
[facility] Emergency Department Provider Note  ____________________________________________   Event Date/Time   First MD Initiated Contact with Patient 07/21/20 0310     (approximate)  I have reviewed the triage vital signs and the nursing notes.   HISTORY  Chief Complaint Covid Positive    HPI [name] is a 50 y.o. female with history of COPD who presents to the emergency department with complaints of sore throat pain with swallowing.   She states on Tuesday, January 25 she started having fevers, nonproductive cough, body aches, nausea, vomiting and diarrhea.  She tested positive for COVID-19 the next day.  States she saw her doctor who prescribed Fioricet, prednisone and azithromycin.  Patient reports she chronically has shortness of breath which is unchanged.  No chest pain or chest discomfort.  She states she had some itching but no rash, hives and thought she could be having allergic reaction tonight.  She took Benadryl without much relief.  No lip or tongue swelling.  No other new exposures.  States she has taken azithromycin in the past without difficulty.        Past Medical History:  Diagnosis Date  . Back pain   . Cancer (HCC)    + CANCER CELLS TO UTERUS  . COPD (chronic obstructive pulmonary disease) ([place])   . Diabetes mellitus without complication ([place])   . Family history of adverse reaction to anesthesia    FATHER STOPS BREATHING WITH ANESTHESIA  . Headache    HX MIGRAINES  . Seizures ([place])    1 SEIZURE  VERY STRESSED    Patient Active Problem List   Diagnosis Date Noted  . Angina pectoris ([place]) 02/29/2020  . Grade I hemorrhoids 07/27/2019  . Degenerative disc disease, lumbar 08/24/2015    Past Surgical History:  Procedure Laterality Date  . ABDOMINAL HYSTERECTOMY    . APPENDECTOMY    . BACK SURGERY    . COLONOSCOPY WITH PROPOFOL N/A 06/01/2019   Procedure: COLONOSCOPY WITH PROPOFOL;  Surgeon: Jonathon Bellows, MD;   Location: [facility] ENDOSCOPY;  Service: Gastroenterology;  Laterality: N/A;  . LEFT HEART CATH AND CORONARY ANGIOGRAPHY Left 03/24/2020   Procedure: LEFT HEART CATH AND CORONARY ANGIOGRAPHY;  Surgeon: Corey Skains, MD;  Location: [place] CV LAB;  Service: Cardiovascular;  Laterality: Left;    Prior to Admission medications   Medication Sig Start Date End Date Taking? Authorizing Provider  lidocaine (XYLOCAINE) 2 % solution Use as directed 15 mLs in the mouth or throat every 4 (four) hours as needed for mouth pain. 07/21/20  Yes Jaxen Samples, Delice Bison, DO  aspirin EC 81 MG tablet Take 81 mg by mouth daily. Swallow whole.    [provider]  atorvastatin (LIPITOR) 10 MG tablet Take 1 tablet (10 mg total) by mouth daily. 02/17/20   Luiz Ochoa, NP  azithromycin (ZITHROMAX Z-PAK) 250 MG tablet Take one tablet daily for 10 days. 07/20/20   Luiz Ochoa, NP  butalbital-acetaminophen-caffeine (FIORICET) [phone] MG tablet Take 1 tablet by mouth every 6 (six) hours as needed for headache. 07/20/20 07/20/21  Luiz Ochoa, NP  Cyanocobalamin (B-12 PO) Take 1 capsule by mouth daily.    [provider]  escitalopram (LEXAPRO) 10 MG tablet Take 10 mg by mouth every evening. 03/05/20   [provider]  fluticasone (FLONASE) 50 MCG/ACT nasal spray Place 2 sprays into both nostrils daily. 06/05/20   Menshew, Dannielle Karvonen, PA-C  gabapentin (NEURONTIN) 100 MG capsule Take  1 capsule (100 mg total) by mouth 3 (three) times daily. 06/15/20   Luiz Ochoa, NP  guaiFENesin (MUCINEX) 600 MG 12 hr tablet Take 1 tablet (600 mg total) by mouth 2 (two) times daily. 07/20/20   Luiz Ochoa, NP  hydrocortisone (ANUSOL-HC) 25 MG suppository Place 1 suppository (25 mg total) rectally 2 (two) times daily. Patient taking differently: Place 25 mg rectally 2 (two) times daily as needed for hemorrhoids. 07/27/19   Fredirick Maudlin, MD  ipratropium-albuterol (DUONEB) 0.5-2.5 (3) MG/3ML SOLN  Take 3 mLs by nebulization every 6 (six) hours as needed. Patient taking differently: Take 3 mLs by nebulization 2 (two) times daily. 03/09/20   Luiz Ochoa, NP  isosorbide mononitrate (IMDUR) 30 MG 24 hr tablet Take 30 mg by mouth daily.  09/10/19 09/09/20  [provider]  metoprolol tartrate (LOPRESSOR) 25 MG tablet Take 25 mg by mouth daily.     [provider]  metroNIDAZOLE (FLAGYL) 500 MG tablet Take 1 tablet (500 mg total) by mouth 2 (two) times daily. 06/18/20   Luiz Ochoa, NP  mometasone-formoterol (DULERA) 200-5 MCG/ACT AERO Inhale 2 puffs into the lungs 2 (two) times daily. 02/21/20   Lavera Guise, MD  nicotine (NICODERM CQ - DOSED IN MG/24 HR) 7 mg/24hr patch Place 7 mg onto the skin daily as needed (nicotine dependence).    [provider]  oxyCODONE-acetaminophen (PERCOCET/ROXICET) 5-325 MG tablet Take 1 tablet by mouth 2 (two) times daily as needed for severe pain. 06/15/20   Luiz Ochoa, NP  predniSONE (DELTASONE) 10 MG tablet Take 1 tablet three times a day with a meal for three for three days, take 1 tablet by twice daily with a meal for 3 days, take 1 tablet once daily with a meal for 3 days 07/20/20   Luiz Ochoa, NP  venlafaxine XR (EFFEXOR XR) 37.5 MG 24 hr capsule Take 1 capsule (37.5 mg total) by mouth daily with breakfast. 02/17/20   Luiz Ochoa, NP    Allergies Chlorhexidine, Clindamycin/lincomycin, Demerol [meperidine], Elemental sulfur, and Morphine and related  Family History  Problem Relation Age of Onset  . Breast cancer Maternal Grandmother 45    Social History Social History   Tobacco Use  . Smoking status: Current Every Day Smoker    Packs/day: 0.50    Years: 28.00    Pack years: 14.00    Types: Cigarettes  . Smokeless tobacco: Never Used  . Tobacco comment: few a day   Vaping Use  . Vaping Use: Never used  Substance Use Topics  . Alcohol use: No    Alcohol/week: 0.0 standard drinks  . Drug use: No     Review of Systems Constitutional: + fever. Eyes: No visual changes. ENT: No sore throat. Cardiovascular: Denies chest pain. Respiratory: Chronic, unchanged shortness of breath. Gastrointestinal: + nausea, vomiting, diarrhea. Genitourinary: Negative for dysuria. Musculoskeletal: Negative for back pain. Skin: Negative for rash. Neurological: Negative for focal weakness or numbness.  ____________________________________________   PHYSICAL EXAM:  VITAL SIGNS: ED Triage Vitals  Enc Vitals Group     BP 07/21/20 0220 (!) 150/97     Pulse Rate 07/21/20 0220 93     Resp 07/21/20 0220 18     Temp 07/21/20 0220 98.4 F (36.9 C)     Temp Source 07/21/20 0220 Oral     SpO2 07/21/20 0220 100 %     Weight 07/21/20 0201 165 lb (74.8 kg)     Height  07/21/20 0201 5\' 4"  (1.626 m)     Head Circumference --      Peak Flow --      Pain Score 07/21/20 0201 5     Pain Loc --      Pain Edu? --      Excl. in [place]? --    CONSTITUTIONAL: Alert and oriented and responds appropriately to questions. Well-appearing; well-nourished, afebrile, nontoxic, well-hydrated HEAD: Normocephalic EYES: Conjunctivae clear, pupils appear equal, EOM appear intact ENT: normal nose; moist mucous membranes; normal posterior pharyngeal erythema but no petechiae, posterior oropharynx is patent without any swelling noted, no tonsillar hypertrophy or exudate, no uvular deviation, no unilateral swelling, no trismus or drooling, no muffled voice, normal phonation, no stridor, no dental caries present, no drainable dental abscess noted, no Ludwig's angina, tongue sits flat in the bottom of the mouth, no angioedema, no facial erythema or warmth, no facial swelling; no pain with movement of the neck, no cervical LAD. NECK: Supple, normal ROM CARD: RRR; S1 and S2 appreciated; no murmurs, no clicks, no rubs, no gallops RESP: Normal chest excursion without splinting or tachypnea; breath sounds clear and equal bilaterally; no  wheezes, no rhonchi, no rales, no hypoxia or respiratory distress, speaking full sentences ABD/GI: Normal bowel sounds; non-distended; soft, non-tender, no rebound, no guarding, no peritoneal signs, no hepatosplenomegaly BACK: The back appears normal EXT: Normal ROM in all joints; no deformity noted, no edema; no cyanosis SKIN: Normal color for age and race; warm; no rash on exposed skin, no urticaria NEURO: Moves all extremities equally PSYCH: The patient's mood and manner are appropriate.  ____________________________________________   LABS (all labs ordered are listed, but only abnormal results are displayed)  Labs Reviewed  BASIC METABOLIC PANEL - Abnormal; Notable for the following components:      Result Value   CO2 20 (*)    Glucose, Bld 169 (*)    All other components within normal limits  CBC  TROPONIN I (HIGH SENSITIVITY)   ____________________________________________  EKG   EKG Interpretation  Date/Time:  Friday July 21 2020 01:57:48 EST Ventricular Rate:  90 PR Interval:  154 QRS Duration: 86 QT Interval:  398 QTC Calculation: 486 R Axis:   91 Text Interpretation: Normal sinus rhythm Rightward axis Borderline ECG No significant change since last tracing Confirmed by Pryor Curia [phone]) on 07/21/2020 3:47:21 AM       ____________________________________________  RADIOLOGY Jessie Foot Enza Shone, personally viewed and evaluated these images (plain radiographs) as part of my medical decision making, as well as reviewing the written report by the radiologist.  ED MD interpretation: Chest x-ray clear.  Official radiology report(s): DG Chest 2 View  Result Date: 07/21/2020 CLINICAL DATA:  Dysphagia EXAM: CHEST - 2 VIEW COMPARISON:  06/05/2020 FINDINGS: The lungs are symmetrically well expanded. Opacity overlying the right sixth rib posteriorly on frontal radiograph likely corresponds to a chest lead better seen on lateral examination. The lungs are clear. No  pneumothorax or pleural effusion. Cardiac size within normal limits. No acute bone abnormality. IMPRESSION: No active cardiopulmonary disease. Electronically Signed   By: Fidela Salisbury MD   On: 07/21/2020 02:36    ____________________________________________   PROCEDURES  Procedure(s) performed (including Critical Care):     ____________________________________________   INITIAL IMPRESSION / ASSESSMENT AND PLAN / ED COURSE  As part of my medical decision making, I reviewed the following data within the [place] notes reviewed and incorporated, Labs reviewed, EKG interpreted NSR, Radiograph reviewed  and Notes from prior ED visits         Patient here with pharyngitis likely from COVID-19.  No sign of meningitis, deep space neck infection, PTA.  Very low suspicion for strep given her exam.  She is concerned this could be an allergic reaction but she has no swelling noted.  No angioedema.  No urticaria.  Discussed with patient that if she is concerned this is an allergic reaction that she should stop her azithromycin.  Chest x-ray shows no pneumonia today.  Labs obtained in triage are unremarkable.  She has chronic shortness of breath but that is unchanged.  No chest pain.  I do not think this is her anginal equivalent.  Given ibuprofen, 1 dose of Decadron and viscous lidocaine here for symptomatic relief.  Recommended alternating Tylenol and Motrin at home.  Will discharge with prescription of lidocaine to use as needed.  I feel she is safe for discharge home.  Patient comfortable with this plan.  Provided with work note.  Patient understands importance of quarantine for 10 days after the onset of symptoms.   At this time, I do not feel there is any life-threatening condition present. I have reviewed, interpreted and discussed all results (EKG, imaging, lab, urine as appropriate) and exam findings with patient/family. I have reviewed nursing notes and appropriate  previous records.  I feel the patient is safe to be discharged home without further emergent workup and can continue workup as an outpatient as needed. Discussed usual and customary return precautions. Patient/family verbalize understanding and are comfortable with this plan.  Outpatient follow-up has been provided as needed. All questions have been answered.       ____________________________________________   FINAL CLINICAL IMPRESSION(S) / ED DIAGNOSES  Final diagnoses:  Pharyngitis due to other organism  COVID-19     ED Discharge Orders         Ordered    lidocaine (XYLOCAINE) 2 % solution  Every 4 hours PRN        07/21/20 0412          *Please note:  Yairi Sabas was evaluated in Emergency Department on 07/21/2020 for the symptoms described in the history of present illness. She was evaluated in the context of the global COVID-19 pandemic, which necessitated consideration that the patient might be at risk for infection with the SARS-CoV-2 virus that causes COVID-19. Institutional protocols and algorithms that pertain to the evaluation of patients at risk for COVID-19 are in a state of rapid change based on information released by regulatory bodies including the CDC and federal and state organizations. These policies and algorithms were followed during the patient's care in the ED.  Some ED evaluations and interventions may be delayed as a result of limited staffing during and the pandemic.*   Note:  This document was prepared using Dragon voice recognition software and may include unintentional dictation errors.   Samamtha Tiegs, Delice Bison, DO 07/21/20 [phone]

## 2020-07-21 NOTE — ED Triage Notes (Addendum)
Pt ambulatory to triage.  Pt positive for covid this week.  Pt reports diff swallowing.  Started meds today for covid.  Pt has a cough.  Pt on 2 liters oxygen at home.  Hx copd.  No rash, no tongue swelling.   Pt alert  Speech clear.

## 2020-07-22 ENCOUNTER — Encounter: Payer: Self-pay | Admitting: Hospice and Palliative Medicine

## 2020-07-27 ENCOUNTER — Ambulatory Visit: Payer: BLUE CROSS/BLUE SHIELD | Admitting: Internal Medicine

## 2020-07-27 ENCOUNTER — Other Ambulatory Visit: Payer: Self-pay

## 2020-07-27 ENCOUNTER — Encounter: Payer: Self-pay | Admitting: Internal Medicine

## 2020-07-27 VITALS — BP 120/88 | HR 94 | Temp 98.4°F | Resp 16 | Ht 64.0 in | Wt 159.8 lb

## 2020-07-27 DIAGNOSIS — R1084 Generalized abdominal pain: Secondary | ICD-10-CM

## 2020-07-27 DIAGNOSIS — G4734 Idiopathic sleep related nonobstructive alveolar hypoventilation: Secondary | ICD-10-CM

## 2020-07-27 DIAGNOSIS — J449 Chronic obstructive pulmonary disease, unspecified: Secondary | ICD-10-CM

## 2020-07-27 DIAGNOSIS — E1165 Type 2 diabetes mellitus with hyperglycemia: Secondary | ICD-10-CM

## 2020-07-27 LAB — POCT GLYCOSYLATED HEMOGLOBIN (HGB A1C): Hemoglobin A1C: 6.7 % — AB (ref 4.0–5.6)

## 2020-07-27 MED ORDER — ACCU-CHEK GUIDE ME W/DEVICE KIT: PACK | 0 refills | Status: DC

## 2020-07-27 MED ORDER — ACCU-CHEK GUIDE VI STRP: ORAL_STRIP | 1 refills | Status: DC

## 2020-07-27 MED ORDER — DAPAGLIFLOZIN PROPANEDIOL 5 MG PO TABS: 5.0000 mg | ORAL_TABLET | Freq: Every day | ORAL | 3 refills | Status: DC

## 2020-07-27 MED ORDER — ACCU-CHEK SOFTCLIX LANCETS MISC: 1 refills | Status: DC

## 2020-07-27 NOTE — Progress Notes (Signed)
[facility] [place], [place] [address]  Internal MEDICINE  Office Visit Note  Patient Name: [name]  [id number]  [id number]  Date of Service: 08/03/2020  Chief Complaint  Patient presents with  . Results    Overnight Ox, wasn't able to do U/S due to having Covid  . Quality Metric Gaps    Hep c screen, HIV screen, Tdap    HPI Pt returns for follow up today. She had covid and was unable to have her [place] of LUQ done for chronic epigastric pain. She will need to be rescheduled for this. She needs to get her vaccination still since she was positive for covid on the day she was supposed to get it. She will reschedule this as well. She is still having post herpetic neuralgia and is using gabapentin which is helping.  She reports that she is now on oxygen at night after having the overnight oximetry done. She is doing well on O2 at night. She is also taking her atorvastatin as prescribed. She is concerned about her recent lab work showing elevated Glucose of 169. She previously had Gestational DM which resulted in a diabetic coma 6 mo after giving birth. She has never been diagnosed with diabetes and has not had an A1c done. Had seen cardiology, on imdur and lopressor but not taking it regularly  Current Medication: Outpatient Encounter Medications as of 07/27/2020  Medication Sig  . aspirin EC 81 MG tablet Take 81 mg by mouth daily. Swallow whole.  Marland Kitchen atorvastatin (LIPITOR) 10 MG tablet Take 1 tablet (10 mg total) by mouth daily.  . butalbital-acetaminophen-caffeine (FIORICET) 50-325-40 MG tablet Take 1 tablet by mouth every 6 (six) hours as needed for headache.  . Cyanocobalamin (B-12 PO) Take 1 capsule by mouth daily.  . dapagliflozin propanediol (FARXIGA) 5 MG TABS tablet Take 1 tablet (5 mg total) by mouth daily before breakfast.  . escitalopram (LEXAPRO) 10 MG tablet Take 10 mg by mouth every evening.  . fluticasone (FLONASE) 50 MCG/ACT nasal spray Place  2 sprays into both nostrils daily.  Marland Kitchen gabapentin (NEURONTIN) 100 MG capsule Take 1 capsule (100 mg total) by mouth 3 (three) times daily.  Marland Kitchen guaiFENesin (MUCINEX) 600 MG 12 hr tablet Take 1 tablet (600 mg total) by mouth 2 (two) times daily.  . hydrocortisone (ANUSOL-HC) 25 MG suppository Place 1 suppository (25 mg total) rectally 2 (two) times daily. (Patient taking differently: Place 25 mg rectally 2 (two) times daily as needed for hemorrhoids.)  . ipratropium-albuterol (DUONEB) 0.5-2.5 (3) MG/3ML SOLN Take 3 mLs by nebulization every 6 (six) hours as needed. (Patient taking differently: Take 3 mLs by nebulization 2 (two) times daily.)  . lidocaine (XYLOCAINE) 2 % solution Use as directed 15 mLs in the mouth or throat every 4 (four) hours as needed for mouth pain.  . metoprolol tartrate (LOPRESSOR) 25 MG tablet Take 25 mg by mouth daily.   . mometasone-formoterol (DULERA) 200-5 MCG/ACT AERO Inhale 2 puffs into the lungs 2 (two) times daily.  . nicotine (NICODERM CQ - DOSED IN MG/24 HR) 7 mg/24hr patch Place 7 mg onto the skin daily as needed (nicotine dependence).  Marland Kitchen oxyCODONE-acetaminophen (PERCOCET/ROXICET) 5-325 MG tablet Take 1 tablet by mouth 2 (two) times daily as needed for severe pain.  . predniSONE (DELTASONE) 10 MG tablet Take 1 tablet three times a day with a meal for three for three days, take 1 tablet by twice daily with a meal for 3 days,  take 1 tablet once daily with a meal for 3 days  . venlafaxine XR (EFFEXOR XR) 37.5 MG 24 hr capsule Take 1 capsule (37.5 mg total) by mouth daily with breakfast.  . [DISCONTINUED] Accu-Chek Softclix Lancets lancets by Other route. Use as instructed once a daily DX E11.65  . [DISCONTINUED] Blood Glucose Monitoring Suppl (ACCU-CHEK GUIDE ME) w/Device KIT by Does not apply route. Use as directed DX E11.65  . [DISCONTINUED] glucose blood (ACCU-CHEK GUIDE) test strip by In Vitro route. Use as directed once a daily DX E11.65  . [DISCONTINUED] isosorbide  mononitrate (IMDUR) 30 MG 24 hr tablet Take 30 mg by mouth daily.   . [DISCONTINUED] azithromycin (ZITHROMAX Z-PAK) 250 MG tablet Take one tablet daily for 10 days. (Patient not taking: Reported on 07/27/2020)  . [DISCONTINUED] metroNIDAZOLE (FLAGYL) 500 MG tablet Take 1 tablet (500 mg total) by mouth 2 (two) times daily. (Patient not taking: Reported on 07/27/2020)   No facility-administered encounter medications on file as of 07/27/2020.    Surgical History: Past Surgical History:  Procedure Laterality Date  . ABDOMINAL HYSTERECTOMY    . APPENDECTOMY    . BACK SURGERY    . COLONOSCOPY WITH PROPOFOL N/A 06/01/2019   Procedure: COLONOSCOPY WITH PROPOFOL;  Surgeon: Jonathon Bellows, MD;  Location: [facility] ENDOSCOPY;  Service: Gastroenterology;  Laterality: N/A;  . LEFT HEART CATH AND CORONARY ANGIOGRAPHY Left 03/24/2020   Procedure: LEFT HEART CATH AND CORONARY ANGIOGRAPHY;  Surgeon: Corey Skains, MD;  Location: [place] CV LAB;  Service: Cardiovascular;  Laterality: Left;    Medical History: Past Medical History:  Diagnosis Date  . Back pain   . Cancer (HCC)    + CANCER CELLS TO UTERUS  . COPD (chronic obstructive pulmonary disease) ([place])   . Diabetes mellitus without complication ([place])   . Family history of adverse reaction to anesthesia    FATHER STOPS BREATHING WITH ANESTHESIA  . Headache    HX MIGRAINES  . Seizures ([place])    1 SEIZURE  VERY STRESSED    Family History: Family History  Problem Relation Age of Onset  . Breast cancer Maternal Grandmother 28    Social History   Socioeconomic History  . Marital status: Single    Spouse name: Not on file  . Number of children: 2  . Years of education: Not on file  . Highest education level: Not on file  Occupational History  . Not on file  Tobacco Use  . Smoking status: Light Tobacco Smoker    Packs/day: 0.50    Years: 28.00    Pack years: 14.00    Types: Cigarettes  . Smokeless tobacco: Never Used  . Tobacco comment:  smokes a few a day maybe  Vaping Use  . Vaping Use: Never used  Substance and Sexual Activity  . Alcohol use: No    Alcohol/week: 0.0 standard drinks  . Drug use: No  . Sexual activity: Not on file  Other Topics Concern  . Not on file  Social History Narrative   Lives with boyfriend    Social Determinants of Health   Financial Resource Strain: Not on file  Food Insecurity: Not on file  Transportation Needs: Not on file  Physical Activity: Not on file  Stress: Not on file  Social Connections: Not on file  Intimate Partner Violence: Not on file      Review of Systems  Constitutional: Negative for chills, fatigue and unexpected weight change.  HENT: Negative for congestion, postnasal drip,  rhinorrhea, sneezing and sore throat.   Eyes: Negative for redness.  Respiratory: Negative for cough, chest tightness and shortness of breath.   Cardiovascular: Negative for chest pain and palpitations.  Gastrointestinal: Negative for abdominal pain, constipation, diarrhea, nausea and vomiting.  Genitourinary: Negative for dysuria and frequency.  Musculoskeletal: Negative for arthralgias, back pain, joint swelling and neck pain.  Skin: Negative for rash.  Neurological: Negative.  Negative for tremors and numbness.  Hematological: Negative for adenopathy. Does not bruise/bleed easily.  Psychiatric/Behavioral: Negative for behavioral problems (Depression), sleep disturbance and suicidal ideas. The patient is not nervous/anxious.     Vital Signs: BP 120/88   Pulse 94   Temp 98.4 F (36.9 C)   Resp 16   Ht '5\' 4"'  (1.626 m)   Wt 159 lb 12.8 oz (72.5 kg)   SpO2 95%   BMI 27.43 kg/m    Physical Exam Constitutional:      General: She is not in acute distress.    Appearance: She is well-developed. She is not diaphoretic.  HENT:     Head: Normocephalic and atraumatic.     Mouth/Throat:     Pharynx: No oropharyngeal exudate.  Eyes:     Pupils: Pupils are equal, round, and reactive to  light.  Neck:     Thyroid: No thyromegaly.     Vascular: No JVD.     Trachea: No tracheal deviation.  Cardiovascular:     Rate and Rhythm: Normal rate and regular rhythm.     Heart sounds: Normal heart sounds. No murmur heard. No friction rub. No gallop.   Pulmonary:     Effort: Pulmonary effort is normal. No respiratory distress.     Breath sounds: No wheezing or rales.  Chest:     Chest wall: No tenderness.  Abdominal:     General: Bowel sounds are normal.     Palpations: Abdomen is soft.  Musculoskeletal:        General: Normal range of motion.     Cervical back: Normal range of motion and neck supple.  Lymphadenopathy:     Cervical: No cervical adenopathy.  Skin:    General: Skin is warm and dry.  Neurological:     Mental Status: She is alert and oriented to person, place, and time.     Cranial Nerves: No cranial nerve deficit.  Psychiatric:        Behavior: Behavior normal.        Thought Content: Thought content normal.        Judgment: Judgment normal.        Assessment/Plan: 1. Uncontrolled type 2 diabetes mellitus with hyperglycemia (HCC) Will start on Farxiga 5 mg po qd. ADA dte 1800 for now  - POCT glycosylated hemoglobin (Hb A1C)  2. Chronic obstructive pulmonary disease, unspecified COPD type ([place]) Improved however had stopped Imdur, she is not sure why she has to take Lopresor since her cardiac test were normal, will get records, in the meantime will  - Pulmonary Function Test; Future  3. Nocturnal hypoxia O2 at night 2 L N/C with humidifier   4. Generalized abdominal discomfort Will reorder U/S once PFT's are done and on next follow up   General Counseling: Amberlyn verbalizes understanding of the findings of todays visit and agrees with plan of treatment. I have discussed any further diagnostic evaluation that may be needed or ordered today. We also reviewed her medications today. she has been encouraged to call the office with any questions or  concerns  that should arise related to todays visit.  Orders Placed This Encounter  Procedures  . For home use only DME oxygen  . POCT glycosylated hemoglobin (Hb A1C)  . Pulmonary Function Test    Meds ordered this encounter  Medications  . dapagliflozin propanediol (FARXIGA) 5 MG TABS tablet    Sig: Take 1 tablet (5 mg total) by mouth daily before breakfast.    Dispense:  30 tablet    Refill:  3    Total time spent:30 Minutes Time spent includes review of chart, medications, test results, and follow up plan with the patient.     Dr Lavera Guise Internal medicine

## 2020-07-31 ENCOUNTER — Telehealth: Payer: Self-pay

## 2020-07-31 NOTE — Telephone Encounter (Signed)
Was speaking with pt, call was disconnected. Tried calling back but no answer and unable to [facility], pt needs to reschedule her appt from 08-25-20 Ander Purpura will not be in the office on this day.

## 2020-08-02 ENCOUNTER — Telehealth: Payer: Self-pay

## 2020-08-02 NOTE — Telephone Encounter (Signed)
LMOM to reschedule 08-25-20 appt to [facility]

## 2020-08-23 ENCOUNTER — Ambulatory Visit: Payer: BLUE CROSS/BLUE SHIELD | Admitting: Internal Medicine

## 2020-08-25 ENCOUNTER — Ambulatory Visit: Payer: BLUE CROSS/BLUE SHIELD | Admitting: Hospice and Palliative Medicine

## 2020-10-26 ENCOUNTER — Ambulatory Visit: Payer: BLUE CROSS/BLUE SHIELD | Admitting: Hospice and Palliative Medicine

## 2020-11-22 ENCOUNTER — Encounter: Payer: Self-pay | Admitting: Emergency Medicine

## 2020-11-22 ENCOUNTER — Emergency Department
Admission: EM | Admit: 2020-11-22 | Discharge: 2020-11-22 | Disposition: A | Discharge status: Home / Self Care | Payer: Self-pay | Attending: Emergency Medicine | Admitting: Emergency Medicine

## 2020-11-22 ENCOUNTER — Emergency Department: Payer: Self-pay

## 2020-11-22 ENCOUNTER — Other Ambulatory Visit: Payer: Self-pay

## 2020-11-22 DIAGNOSIS — Z8542 Personal history of malignant neoplasm of other parts of uterus: Secondary | ICD-10-CM | POA: Insufficient documentation

## 2020-11-22 DIAGNOSIS — J449 Chronic obstructive pulmonary disease, unspecified: Secondary | ICD-10-CM | POA: Insufficient documentation

## 2020-11-22 DIAGNOSIS — Z7982 Long term (current) use of aspirin: Secondary | ICD-10-CM | POA: Insufficient documentation

## 2020-11-22 DIAGNOSIS — R109 Unspecified abdominal pain: Secondary | ICD-10-CM | POA: Insufficient documentation

## 2020-11-22 DIAGNOSIS — E119 Type 2 diabetes mellitus without complications: Secondary | ICD-10-CM | POA: Insufficient documentation

## 2020-11-22 DIAGNOSIS — R140 Abdominal distension (gaseous): Secondary | ICD-10-CM | POA: Insufficient documentation

## 2020-11-22 DIAGNOSIS — Z79899 Other long term (current) drug therapy: Secondary | ICD-10-CM | POA: Insufficient documentation

## 2020-11-22 DIAGNOSIS — F17210 Nicotine dependence, cigarettes, uncomplicated: Secondary | ICD-10-CM | POA: Insufficient documentation

## 2020-11-22 DIAGNOSIS — K5901 Slow transit constipation: Secondary | ICD-10-CM | POA: Insufficient documentation

## 2020-11-22 LAB — URINALYSIS, COMPLETE (UACMP) WITH MICROSCOPIC
Bilirubin Urine: NEGATIVE
Glucose, UA: NEGATIVE mg/dL
Ketones, ur: NEGATIVE mg/dL
Leukocytes,Ua: NEGATIVE
Nitrite: NEGATIVE
Protein, ur: NEGATIVE mg/dL
Specific Gravity, Urine: 1.017 (ref 1.005–1.030)
pH: 5 (ref 5.0–8.0)

## 2020-11-22 LAB — CBC
HCT: 36.8 % (ref 36.0–46.0)
Hemoglobin: 12.6 g/dL (ref 12.0–15.0)
MCH: 32.1 pg (ref 26.0–34.0)
MCHC: 34.2 g/dL (ref 30.0–36.0)
MCV: 93.6 fL (ref 80.0–100.0)
Platelets: 327 10*3/uL (ref 150–400)
RBC: 3.93 MIL/uL (ref 3.87–5.11)
RDW: 12.4 % (ref 11.5–15.5)
WBC: 10.4 10*3/uL (ref 4.0–10.5)
nRBC: 0 % (ref 0.0–0.2)

## 2020-11-22 LAB — COMPREHENSIVE METABOLIC PANEL
ALT: 9 U/L (ref 0–44)
AST: 19 U/L (ref 15–41)
Albumin: 3.6 g/dL (ref 3.5–5.0)
Alkaline Phosphatase: 67 U/L (ref 38–126)
Anion gap: 9 (ref 5–15)
BUN: 9 mg/dL (ref 6–20)
CO2: 20 mmol/L — ABNORMAL LOW (ref 22–32)
Calcium: 9 mg/dL (ref 8.9–10.3)
Chloride: 106 mmol/L (ref 98–111)
Creatinine, Ser: 0.81 mg/dL (ref 0.44–1.00)
GFR, Estimated: >60 mL/min (ref >60)
Glucose, Bld: 183 mg/dL — ABNORMAL HIGH (ref 70–99)
Potassium: 3.8 mmol/L (ref 3.5–5.1)
Sodium: 135 mmol/L (ref 135–145)
Total Bilirubin: 0.4 mg/dL (ref 0.3–1.2)
Total Protein: 6.6 g/dL (ref 6.5–8.1)

## 2020-11-22 LAB — LIPASE, BLOOD: Lipase: 39 U/L (ref 11–51)

## 2020-11-22 MED ORDER — GLYCERIN (LAXATIVE) 2 G RE SUPP
1.0000 | Freq: Once | RECTAL | Status: AC
  Administered 2020-11-22: 1 via RECTAL
  Filled 2020-11-22: qty 1

## 2020-11-22 MED ORDER — MAGNESIUM CITRATE PO SOLN
1.0000 | Freq: Once | ORAL | Status: AC
  Administered 2020-11-22: 1 via ORAL
  Filled 2020-11-22: qty 296

## 2020-11-22 MED ORDER — MAGNESIUM CITRATE PO SOLN
1.0000 | Freq: Once | ORAL | Status: AC
  Administered 2020-11-22: 1 via ORAL

## 2020-11-22 MED ORDER — MAGNESIUM CITRATE PO SOLN
1.0000 | Freq: Once | ORAL | Status: DC
  Filled 2020-11-22: qty 296

## 2020-11-22 MED ORDER — POLYETHYLENE GLYCOL 3350 17 G PO PACK: 17.0000 g | PACK | Freq: Every day | ORAL | 0 refills | Status: DC

## 2020-11-22 NOTE — Discharge Instructions (Signed)
Read and follow discharge care instructions.  Take medication as directed.  Call  GI clinic listed on your discharge care instructions and tell them you a follow-up from the emergency room.

## 2020-11-22 NOTE — ED Provider Notes (Signed)
[facility] Emergency Department Provider Note   ____________________________________________   Event Date/Time   First MD Initiated Contact with Patient 11/22/20 [phone]     (approximate)  I have reviewed the triage vital signs and the nursing notes.   HISTORY  Chief Complaint Constipation    HPI [name] is a 50 y.o. female patient presents with abdominal pain and constipation.  Patient states she has very small bowel movements.  Patient has not had a full bowel movement in the past 4 months.  Patient has noticed abdominal distention.  Patient was scheduled to see a GI specialist but canceled appointment.  Patient rates abdominal pain as 8/10.  Patient described pain as "crampy".  No palliative measure for complaint.         Past Medical History:  Diagnosis Date  . Back pain   . Cancer (HCC)    + CANCER CELLS TO UTERUS  . COPD (chronic obstructive pulmonary disease) ([place])   . Diabetes mellitus without complication ([place])   . Family history of adverse reaction to anesthesia    FATHER STOPS BREATHING WITH ANESTHESIA  . Headache    HX MIGRAINES  . Seizures ([place])    1 SEIZURE  VERY STRESSED    Patient Active Problem List   Diagnosis Date Noted  . Angina pectoris ([place]) 02/29/2020  . Grade I hemorrhoids 07/27/2019  . Degenerative disc disease, lumbar 08/24/2015    Past Surgical History:  Procedure Laterality Date  . ABDOMINAL HYSTERECTOMY    . APPENDECTOMY    . BACK SURGERY    . COLONOSCOPY WITH PROPOFOL N/A 06/01/2019   Procedure: COLONOSCOPY WITH PROPOFOL;  Surgeon: Jonathon Bellows, MD;  Location: [facility] ENDOSCOPY;  Service: Gastroenterology;  Laterality: N/A;  . LEFT HEART CATH AND CORONARY ANGIOGRAPHY Left 03/24/2020   Procedure: LEFT HEART CATH AND CORONARY ANGIOGRAPHY;  Surgeon: Corey Skains, MD;  Location: [place] CV LAB;  Service: Cardiovascular;  Laterality: Left;    Prior to Admission medications   Medication Sig  Start Date End Date Taking? Authorizing Provider  polyethylene glycol (MIRALAX) 17 g packet Take 17 g by mouth daily. 11/22/20  Yes Sable Feil, PA-C  Accu-Chek Softclix Lancets lancets Use as instructed once a daily DX E11.65 07/27/20   Lavera Guise, MD  aspirin EC 81 MG tablet Take 81 mg by mouth daily. Swallow whole.    [provider]  atorvastatin (LIPITOR) 10 MG tablet Take 1 tablet (10 mg total) by mouth daily. 02/17/20   Luiz Ochoa, NP  Blood Glucose Monitoring Suppl (ACCU-CHEK GUIDE ME) w/Device KIT Use as directed DX E11.65 07/27/20   Lavera Guise, MD  butalbital-acetaminophen-caffeine [facility]) [phone] MG tablet Take 1 tablet by mouth every 6 (six) hours as needed for headache. 07/20/20 07/20/21  Luiz Ochoa, NP  Cyanocobalamin (B-12 PO) Take 1 capsule by mouth daily.    [provider]  dapagliflozin propanediol (FARXIGA) 5 MG TABS tablet Take 1 tablet (5 mg total) by mouth daily before breakfast. 07/27/20   Lavera Guise, MD  escitalopram (LEXAPRO) 10 MG tablet Take 10 mg by mouth every evening. 03/05/20   [provider]  fluticasone (FLONASE) 50 MCG/ACT nasal spray Place 2 sprays into both nostrils daily. 06/05/20   Menshew, Dannielle Karvonen, PA-C  gabapentin (NEURONTIN) 100 MG capsule Take 1 capsule (100 mg total) by mouth 3 (three) times daily. 06/15/20   Luiz Ochoa, NP  glucose blood (ACCU-CHEK GUIDE) test  strip Use as directed once a daily DX E11.65 07/27/20   Lavera Guise, MD  guaiFENesin (MUCINEX) 600 MG 12 hr tablet Take 1 tablet (600 mg total) by mouth 2 (two) times daily. 07/20/20   Luiz Ochoa, NP  hydrocortisone (ANUSOL-HC) 25 MG suppository Place 1 suppository (25 mg total) rectally 2 (two) times daily. Patient taking differently: Place 25 mg rectally 2 (two) times daily as needed for hemorrhoids. 07/27/19   Fredirick Maudlin, MD  ipratropium-albuterol (DUONEB) 0.5-2.5 (3) MG/3ML SOLN Take 3 mLs by nebulization every 6 (six) hours as  needed. Patient taking differently: Take 3 mLs by nebulization 2 (two) times daily. 03/09/20   Luiz Ochoa, NP  lidocaine (XYLOCAINE) 2 % solution Use as directed 15 mLs in the mouth or throat every 4 (four) hours as needed for mouth pain. 07/21/20   Ward, Delice Bison, DO  metoprolol tartrate (LOPRESSOR) 25 MG tablet Take 25 mg by mouth daily.     [provider]  mometasone-formoterol (DULERA) 200-5 MCG/ACT AERO Inhale 2 puffs into the lungs 2 (two) times daily. 02/21/20   Lavera Guise, MD  nicotine (NICODERM CQ - DOSED IN MG/24 HR) 7 mg/24hr patch Place 7 mg onto the skin daily as needed (nicotine dependence).    [provider]  predniSONE (DELTASONE) 10 MG tablet Take 1 tablet three times a day with a meal for three for three days, take 1 tablet by twice daily with a meal for 3 days, take 1 tablet once daily with a meal for 3 days 07/20/20   Luiz Ochoa, NP  venlafaxine XR (EFFEXOR XR) 37.5 MG 24 hr capsule Take 1 capsule (37.5 mg total) by mouth daily with breakfast. 02/17/20   Luiz Ochoa, NP    Allergies Chlorhexidine, Clindamycin/lincomycin, Demerol [meperidine], Elemental sulfur, and Morphine and related  Family History  Problem Relation Age of Onset  . Breast cancer Maternal Grandmother 87    Social History Social History   Tobacco Use  . Smoking status: Light Tobacco Smoker    Packs/day: 0.50    Years: 28.00    Pack years: 14.00    Types: Cigarettes  . Smokeless tobacco: Never Used  . Tobacco comment: smokes a few a day maybe  Vaping Use  . Vaping Use: Never used  Substance Use Topics  . Alcohol use: No    Alcohol/week: 0.0 standard drinks  . Drug use: No    Review of Systems Constitutional: No fever/chills Eyes: No visual changes. ENT: No sore throat. Cardiovascular: Denies chest pain. Respiratory: Denies shortness of breath. Gastrointestinal: Diffuse abdominal pain.  No nausea, no vomiting.  No diarrhea.  constipation. Genitourinary:  Negative for dysuria. Musculoskeletal: Negative for back pain. Skin: Negative for rash. Neurological: Negative for headaches, focal weakness or numbness. Endocrine:  Diabetes, hyperlipidemia, hypertension. Hematological/Lymphatic:  Allergic/Immunilogical: Chlorhexidine, clindamycin, Demerol, sulfa, and morphine products. ____________________________________________   PHYSICAL EXAM:  VITAL SIGNS: ED Triage Vitals  Enc Vitals Group     BP 11/22/20 0743 (!) 125/97     Pulse Rate 11/22/20 0743 89     Resp 11/22/20 0743 17     Temp 11/22/20 0743 98.4 F (36.9 C)     Temp Source 11/22/20 0743 Oral     SpO2 11/22/20 0743 97 %     Weight 11/22/20 0747 185 lb (83.9 kg)     Height 11/22/20 0747 '5\' 4"'  (1.626 m)     Head Circumference --      Peak Flow --  Pain Score 11/22/20 0746 8     Pain Loc --      Pain Edu? --      Excl. in [place]? --     Constitutional: Alert and oriented. Well appearing and in no acute distress. Cardiovascular: Normal rate, regular rhythm. Grossly normal heart sounds.  Good peripheral circulation. Respiratory: Normal respiratory effort.  No retractions. Lungs CTAB. Gastrointestinal: Abdominal distention, decreased bowel sounds, soft and nontender to palpation.  Genitourinary: Deferred Musculoskeletal: No lower extremity tenderness nor edema.  No joint effusions. Neurologic:  Normal speech and language. No gross focal neurologic deficits are appreciated. No gait instability. Skin:  Skin is warm, dry and intact. No rash noted. Psychiatric: Mood and affect are normal. Speech and behavior are normal.  ____________________________________________   LABS (all labs ordered are listed, but only abnormal results are displayed)  Labs Reviewed  COMPREHENSIVE METABOLIC PANEL - Abnormal; Notable for the following components:      Result Value   CO2 20 (*)    Glucose, Bld 183 (*)    All other components within normal limits  URINALYSIS, COMPLETE (UACMP) WITH  MICROSCOPIC - Abnormal; Notable for the following components:   Color, Urine YELLOW (*)    APPearance HAZY (*)    Hgb urine dipstick SMALL (*)    Bacteria, UA RARE (*)    All other components within normal limits  LIPASE, BLOOD  CBC   ____________________________________________  EKG   ____________________________________________  RADIOLOGY I, Sable Feil, personally viewed and evaluated these images (plain radiographs) as part of my medical decision making, as well as reviewing the written report by the radiologist.  ED MD interpretation: Moderate stool burden. Official radiology report(s): DG Abdomen 1 View  Result Date: 11/22/2020 CLINICAL DATA:  Abdominal pain with constipation EXAM: ABDOMEN - 1 VIEW COMPARISON:  CT abdomen and pelvis May 21, 2019. FINDINGS: There is moderate stool throughout the colon. There is no bowel dilatation or air-fluid level to suggest bowel obstruction. No free air. Postoperative changes noted in the lower lumbar region. Probable phleboliths noted in the pelvis. Lung bases clear. IMPRESSION: Moderate stool in colon. No bowel obstruction or free air evident on supine examination. Visualized lung bases clear. Electronically Signed   By: Lowella Grip III M.D.   On: 11/22/2020 09:03    ____________________________________________   PROCEDURES  Procedure(s) performed (including Critical Care):  Procedures   ____________________________________________   INITIAL IMPRESSION / ASSESSMENT AND PLAN / ED COURSE  As part of my medical decision making, I reviewed the following data within the [place]         Patient complaint of 4 months of constipation.  Discussed KUB with patient showing no impaction.  Patient given discharge care instructions and advised to take MiraLAX as directed.  Patient given a consult to GI clinic for further evaluation for chronic constipation.  Return to ED if condition worsens.       ____________________________________________   FINAL CLINICAL IMPRESSION(S) / ED DIAGNOSES  Final diagnoses:  Constipation by delayed colonic transit     ED Discharge Orders         Ordered    polyethylene glycol (MIRALAX) 17 g packet  Daily        11/22/20 1124           Note:  This document was prepared using Dragon voice recognition software and may include unintentional dictation errors.    Sable Feil, PA-C 11/22/20 1128    Paduchowski, Lennette Bihari,  MD 11/22/20 1438

## 2020-11-22 NOTE — ED Triage Notes (Signed)
Pt arrives to ED via POV with complaints of abd pain, and constipation. Pt states she passed a small bowel movement this morning. Pt hasn't had full bowel movements in the past 4 months. Pt has abd distention present. Pt was expected to see GI specialist, appt cancelled. Pt ambulatory to triage. Even and unlabored respirations.

## 2020-11-23 ENCOUNTER — Ambulatory Visit (INDEPENDENT_AMBULATORY_CARE_PROVIDER_SITE_OTHER): Payer: Self-pay | Admitting: Physician Assistant

## 2020-11-23 ENCOUNTER — Encounter: Payer: Self-pay | Admitting: Physician Assistant

## 2020-11-23 DIAGNOSIS — R1084 Generalized abdominal pain: Secondary | ICD-10-CM

## 2020-11-23 DIAGNOSIS — Z09 Encounter for follow-up examination after completed treatment for conditions other than malignant neoplasm: Secondary | ICD-10-CM

## 2020-11-23 DIAGNOSIS — R1012 Left upper quadrant pain: Secondary | ICD-10-CM

## 2020-11-23 DIAGNOSIS — K047 Periapical abscess without sinus: Secondary | ICD-10-CM

## 2020-11-23 DIAGNOSIS — G4734 Idiopathic sleep related nonobstructive alveolar hypoventilation: Secondary | ICD-10-CM

## 2020-11-23 DIAGNOSIS — K5901 Slow transit constipation: Secondary | ICD-10-CM

## 2020-11-23 DIAGNOSIS — E1165 Type 2 diabetes mellitus with hyperglycemia: Secondary | ICD-10-CM

## 2020-11-23 LAB — POCT GLYCOSYLATED HEMOGLOBIN (HGB A1C): Hemoglobin A1C: 6.9 % — AB (ref 4.0–5.6)

## 2020-11-23 MED ORDER — DAPAGLIFLOZIN PROPANEDIOL 10 MG PO TABS: 10.0000 mg | ORAL_TABLET | Freq: Every day | ORAL | 2 refills | Status: DC

## 2020-11-23 MED ORDER — AMOXICILLIN-POT CLAVULANATE 875-125 MG PO TABS: 1.0000 | ORAL_TABLET | Freq: Two times a day (BID) | ORAL | 0 refills | Status: DC

## 2020-11-23 NOTE — Progress Notes (Signed)
[facility] [place], [place] [address]  Internal MEDICINE  Office Visit Note  Patient Name: [name]  [id number]  [id number]  Date of Service: 11/26/2020     Chief Complaint  Patient presents with  . Hospitalization Follow-up    Stomach issues and constipation   . Diabetes  . COPD  . Quality Metric Gaps    Shingrix      HPI Pt is here for recent hospital follow up to obtain a GI referral to specialist recommended by ED. Martin Majestic to ED yesterday for chest pain and abdominal pain. Did a home enema and it didnt help, neither did the laxative. Only thing that helped was given in ED called Citroma. Taking miralax daily already and doesn't help. -Lost 21 pounds since ED arrival yesterday AM since laxatives have cleared her out. She has dealt with chronic constipation her whole life -A1c rose to 6.9, but fasting levels at home have been 110 -She also has another dental abscess lower left side and is requesting antibiotic to help with this. She needs to have her teeth pulled due to recurrent/spreading infections putting her health at risk.  Current Medication: Outpatient Encounter Medications as of 11/23/2020  Medication Sig  . amoxicillin-clavulanate (AUGMENTIN) 875-125 MG tablet Take 1 tablet by mouth 2 (two) times daily.  . dapagliflozin propanediol (FARXIGA) 10 MG TABS tablet Take 1 tablet (10 mg total) by mouth daily before breakfast.  . Accu-Chek Softclix Lancets lancets Use as instructed once a daily DX E11.65  . aspirin EC 81 MG tablet Take 81 mg by mouth daily. Swallow whole.  Marland Kitchen atorvastatin (LIPITOR) 10 MG tablet Take 1 tablet (10 mg total) by mouth daily.  . Blood Glucose Monitoring Suppl (ACCU-CHEK GUIDE ME) w/Device KIT Use as directed DX E11.65  . butalbital-acetaminophen-caffeine (FIORICET) 50-325-40 MG tablet Take 1 tablet by mouth every 6 (six) hours as needed for headache.  . Cyanocobalamin (B-12 PO) Take 1 capsule by mouth daily.  Marland Kitchen  escitalopram (LEXAPRO) 10 MG tablet Take 10 mg by mouth every evening.  . fluticasone (FLONASE) 50 MCG/ACT nasal spray Place 2 sprays into both nostrils daily.  Marland Kitchen gabapentin (NEURONTIN) 100 MG capsule Take 1 capsule (100 mg total) by mouth 3 (three) times daily.  Marland Kitchen glucose blood (ACCU-CHEK GUIDE) test strip Use as directed once a daily DX E11.65  . guaiFENesin (MUCINEX) 600 MG 12 hr tablet Take 1 tablet (600 mg total) by mouth 2 (two) times daily.  . hydrocortisone (ANUSOL-HC) 25 MG suppository Place 1 suppository (25 mg total) rectally 2 (two) times daily. (Patient taking differently: Place 25 mg rectally 2 (two) times daily as needed for hemorrhoids.)  . ipratropium-albuterol (DUONEB) 0.5-2.5 (3) MG/3ML SOLN Take 3 mLs by nebulization every 6 (six) hours as needed. (Patient taking differently: Take 3 mLs by nebulization 2 (two) times daily.)  . lidocaine (XYLOCAINE) 2 % solution Use as directed 15 mLs in the mouth or throat every 4 (four) hours as needed for mouth pain.  . metoprolol tartrate (LOPRESSOR) 25 MG tablet Take 25 mg by mouth daily.   . mometasone-formoterol (DULERA) 200-5 MCG/ACT AERO Inhale 2 puffs into the lungs 2 (two) times daily.  . nicotine (NICODERM CQ - DOSED IN MG/24 HR) 7 mg/24hr patch Place 7 mg onto the skin daily as needed (nicotine dependence).  . polyethylene glycol (MIRALAX) 17 g packet Take 17 g by mouth daily.  . predniSONE (DELTASONE) 10 MG tablet Take 1 tablet three times a day  with a meal for three for three days, take 1 tablet by twice daily with a meal for 3 days, take 1 tablet once daily with a meal for 3 days  . venlafaxine XR (EFFEXOR XR) 37.5 MG 24 hr capsule Take 1 capsule (37.5 mg total) by mouth daily with breakfast.  . [DISCONTINUED] dapagliflozin propanediol (FARXIGA) 5 MG TABS tablet Take 1 tablet (5 mg total) by mouth daily before breakfast.   No facility-administered encounter medications on file as of 11/23/2020.    Surgical History: Past Surgical  History:  Procedure Laterality Date  . ABDOMINAL HYSTERECTOMY    . APPENDECTOMY    . BACK SURGERY    . COLONOSCOPY WITH PROPOFOL N/A 06/01/2019   Procedure: COLONOSCOPY WITH PROPOFOL;  Surgeon: Jonathon Bellows, MD;  Location: [facility] ENDOSCOPY;  Service: Gastroenterology;  Laterality: N/A;  . LEFT HEART CATH AND CORONARY ANGIOGRAPHY Left 03/24/2020   Procedure: LEFT HEART CATH AND CORONARY ANGIOGRAPHY;  Surgeon: Corey Skains, MD;  Location: [place] CV LAB;  Service: Cardiovascular;  Laterality: Left;    Medical History: Past Medical History:  Diagnosis Date  . Back pain   . Cancer (HCC)    + CANCER CELLS TO UTERUS  . COPD (chronic obstructive pulmonary disease) ([place])   . Diabetes mellitus without complication ([place])   . Family history of adverse reaction to anesthesia    FATHER STOPS BREATHING WITH ANESTHESIA  . Headache    HX MIGRAINES  . Seizures ([place])    1 SEIZURE  VERY STRESSED    Family History: Family History  Problem Relation Age of Onset  . Breast cancer Maternal Grandmother 5    Social History   Socioeconomic History  . Marital status: Single    Spouse name: Not on file  . Number of children: 2  . Years of education: Not on file  . Highest education level: Not on file  Occupational History  . Not on file  Tobacco Use  . Smoking status: Light Tobacco Smoker    Packs/day: 0.50    Years: 28.00    Pack years: 14.00    Types: Cigarettes  . Smokeless tobacco: Never Used  . Tobacco comment: smokes a few a day maybe  Vaping Use  . Vaping Use: Never used  Substance and Sexual Activity  . Alcohol use: No    Alcohol/week: 0.0 standard drinks  . Drug use: No  . Sexual activity: Not on file  Other Topics Concern  . Not on file  Social History Narrative   Lives with boyfriend    Social Determinants of Health   Financial Resource Strain: Not on file  Food Insecurity: Not on file  Transportation Needs: Not on file  Physical Activity: Not on file   Stress: Not on file  Social Connections: Not on file  Intimate Partner Violence: Not on file      Review of Systems  Constitutional: Negative for chills, fatigue and unexpected weight change.  HENT: Positive for dental problem. Negative for congestion, postnasal drip, rhinorrhea, sneezing and sore throat.        Dental abscess that is painful  Eyes: Negative for redness.  Respiratory: Negative for cough, chest tightness and shortness of breath.   Cardiovascular: Negative for chest pain and palpitations.  Gastrointestinal: Positive for abdominal pain and constipation. Negative for anal bleeding, blood in stool, diarrhea, nausea and vomiting.  Genitourinary: Negative for dysuria and frequency.  Musculoskeletal: Negative for arthralgias, back pain, joint swelling and neck pain.  Skin:  Negative for rash.  Neurological: Negative.  Negative for tremors and numbness.  Hematological: Negative for adenopathy. Does not bruise/bleed easily.  Psychiatric/Behavioral: Negative for behavioral problems (Depression), sleep disturbance and suicidal ideas. The patient is not nervous/anxious.     Vital Signs: BP 110/84   Pulse 83   Temp (!) 97.3 F (36.3 C)   Resp 16   Ht '5\' 4"'  (1.626 m)   Wt 164 lb 3.2 oz (74.5 kg)   SpO2 99%   BMI 28.18 kg/m    Physical Exam Vitals and nursing note reviewed.  Constitutional:      General: She is not in acute distress.    Appearance: She is well-developed. She is not diaphoretic.  HENT:     Head: Normocephalic and atraumatic.     Mouth/Throat:     Pharynx: No oropharyngeal exudate.     Comments: Dental abscess on lower left with poor dentition throughout Eyes:     Pupils: Pupils are equal, round, and reactive to light.  Neck:     Thyroid: No thyromegaly.     Vascular: No JVD.     Trachea: No tracheal deviation.  Cardiovascular:     Rate and Rhythm: Normal rate and regular rhythm.     Heart sounds: Normal heart sounds. No murmur heard. No  friction rub. No gallop.   Pulmonary:     Effort: Pulmonary effort is normal. No respiratory distress.     Breath sounds: No wheezing or rales.  Chest:     Chest wall: No tenderness.  Abdominal:     General: Bowel sounds are normal.     Palpations: Abdomen is soft. There is no mass.     Tenderness: There is abdominal tenderness. There is no guarding or rebound.     Comments: Generalized tenderness with pain worse in LUQ  Musculoskeletal:        General: Normal range of motion.     Cervical back: Normal range of motion and neck supple.  Lymphadenopathy:     Cervical: No cervical adenopathy.  Skin:    General: Skin is warm and dry.  Neurological:     Mental Status: She is alert and oriented to person, place, and time.     Cranial Nerves: No cranial nerve deficit.  Psychiatric:        Behavior: Behavior normal.        Thought Content: Thought content normal.        Judgment: Judgment normal.       Assessment/Plan: 1. Constipation by delayed colonic transit Patient recently seen in the ED due to severe constipation, patient reports feeling better since being cleared out and continues on MiraLAX.  Patient requesting referral to GI specialist recommended by ED which has been placed today - Ambulatory referral to Gastroenterology  2. Generalized abdominal discomfort GI referral placed - Ambulatory referral to Gastroenterology  3. LUQ abdominal pain GI referral placed - Ambulatory referral to Gastroenterology  4. Uncontrolled type 2 diabetes mellitus with hyperglycemia (HCC) - POCT HgB A1C is 6.9, which is up from 6.7 last visit.  We will increase Farxiga to 10 mg and have patient continue to monitor blood glucose levels at home.  Will continue to improve diet and exercise as able - dapagliflozin propanediol (FARXIGA) 10 MG TABS tablet; Take 1 tablet (10 mg total) by mouth daily before breakfast.  Dispense: 30 tablet; Refill: 2  5. Dental abscess Will start on Augmentin for  treatment of dental abscess.  Patient will need  to be seen by dentist and will likely need teeth pulled due to recurrence and spreading of dental infections that put her health at risk for conditions such as endocarditis. She has a history of sepsis from previous dental infection. - amoxicillin-clavulanate (AUGMENTIN) 875-125 MG tablet; Take 1 tablet by mouth 2 (two) times daily.  Dispense: 20 tablet; Refill: 0  6. Nocturnal hypoxia Continue oxygen  7. Encounter for examination following treatment at hospital Reviewed ED course and medications  General Counseling: Jacie verbalizes understanding of the findings of todays visit and agrees with plan of treatment. I have discussed any further diagnostic evaluation that may be needed or ordered today. We also reviewed her medications today. she has been encouraged to call the office with any questions or concerns that should arise related to todays visit.    Counseling:    Orders Placed This Encounter  Procedures  . Ambulatory referral to Gastroenterology  . POCT HgB A1C    This patient was seen by Drema Dallas, PA-C in collaboration with Dr. Clayborn Bigness as a part of collaborative care agreement.   I have reviewed all medical records from hospital follow up including radiology reports and consults from other physicians. Appropriate follow up diagnostics will be scheduled as needed. Patient/ Family understands the plan of treatment. Time spent40 minutes.   Dr Lavera Guise, MD Internal Medicine

## 2020-12-20 ENCOUNTER — Other Ambulatory Visit: Payer: Self-pay

## 2020-12-20 ENCOUNTER — Emergency Department: Payer: PRIVATE HEALTH INSURANCE

## 2020-12-20 DIAGNOSIS — Z7982 Long term (current) use of aspirin: Secondary | ICD-10-CM | POA: Insufficient documentation

## 2020-12-20 DIAGNOSIS — Z955 Presence of coronary angioplasty implant and graft: Secondary | ICD-10-CM | POA: Insufficient documentation

## 2020-12-20 DIAGNOSIS — K5900 Constipation, unspecified: Secondary | ICD-10-CM | POA: Insufficient documentation

## 2020-12-20 DIAGNOSIS — Z7951 Long term (current) use of inhaled steroids: Secondary | ICD-10-CM | POA: Insufficient documentation

## 2020-12-20 DIAGNOSIS — Z85828 Personal history of other malignant neoplasm of skin: Secondary | ICD-10-CM | POA: Insufficient documentation

## 2020-12-20 DIAGNOSIS — K910 Vomiting following gastrointestinal surgery: Secondary | ICD-10-CM | POA: Insufficient documentation

## 2020-12-20 DIAGNOSIS — E119 Type 2 diabetes mellitus without complications: Secondary | ICD-10-CM | POA: Insufficient documentation

## 2020-12-20 DIAGNOSIS — J449 Chronic obstructive pulmonary disease, unspecified: Secondary | ICD-10-CM | POA: Insufficient documentation

## 2020-12-20 DIAGNOSIS — F17210 Nicotine dependence, cigarettes, uncomplicated: Secondary | ICD-10-CM | POA: Insufficient documentation

## 2020-12-20 LAB — CBC
HCT: 38.6 % (ref 36.0–46.0)
Hemoglobin: 13.3 g/dL (ref 12.0–15.0)
MCH: 32.3 pg (ref 26.0–34.0)
MCHC: 34.5 g/dL (ref 30.0–36.0)
MCV: 93.7 fL (ref 80.0–100.0)
Platelets: 281 10*3/uL (ref 150–400)
RBC: 4.12 MIL/uL (ref 3.87–5.11)
RDW: 12.9 % (ref 11.5–15.5)
WBC: 10.8 10*3/uL — ABNORMAL HIGH (ref 4.0–10.5)
nRBC: 0 % (ref 0.0–0.2)

## 2020-12-20 LAB — COMPREHENSIVE METABOLIC PANEL
ALT: 12 U/L (ref 0–44)
AST: 17 U/L (ref 15–41)
Albumin: 3.9 g/dL (ref 3.5–5.0)
Alkaline Phosphatase: 73 U/L (ref 38–126)
Anion gap: 7 (ref 5–15)
BUN: 9 mg/dL (ref 6–20)
CO2: 23 mmol/L (ref 22–32)
Calcium: 8.9 mg/dL (ref 8.9–10.3)
Chloride: 105 mmol/L (ref 98–111)
Creatinine, Ser: 0.89 mg/dL (ref 0.44–1.00)
GFR, Estimated: >60 mL/min (ref >60)
Glucose, Bld: 171 mg/dL — ABNORMAL HIGH (ref 70–99)
Potassium: 3.4 mmol/L — ABNORMAL LOW (ref 3.5–5.1)
Sodium: 135 mmol/L (ref 135–145)
Total Bilirubin: 0.5 mg/dL (ref 0.3–1.2)
Total Protein: 7 g/dL (ref 6.5–8.1)

## 2020-12-20 LAB — LIPASE, BLOOD: Lipase: 38 U/L (ref 11–51)

## 2020-12-20 NOTE — ED Triage Notes (Signed)
Pt states she has had burning in her abd for the past 3 days, pt states she has been vomiting after eating as well. Pt has apt with GI doc on July 19th. Pt hasnt had a bowel movement in 2 weeks.

## 2020-12-21 ENCOUNTER — Emergency Department
Admission: EM | Admit: 2020-12-21 | Discharge: 2020-12-21 | Disposition: A | Discharge status: Home / Self Care | Payer: PRIVATE HEALTH INSURANCE | Attending: Emergency Medicine | Admitting: Emergency Medicine

## 2020-12-21 DIAGNOSIS — R1110 Vomiting, unspecified: Secondary | ICD-10-CM

## 2020-12-21 DIAGNOSIS — R1013 Epigastric pain: Secondary | ICD-10-CM

## 2020-12-21 DIAGNOSIS — K5900 Constipation, unspecified: Secondary | ICD-10-CM

## 2020-12-21 MED ORDER — DICYCLOMINE HCL 10 MG PO CAPS
10.0000 mg | ORAL_CAPSULE | Freq: Once | ORAL | Status: AC
  Administered 2020-12-21: 10 mg via ORAL
  Filled 2020-12-21: qty 1

## 2020-12-21 MED ORDER — ONDANSETRON 4 MG PO TBDP
4.0000 mg | ORAL_TABLET | Freq: Once | ORAL | Status: AC
  Administered 2020-12-21: 4 mg via ORAL
  Filled 2020-12-21: qty 1

## 2020-12-21 MED ORDER — OMEPRAZOLE MAGNESIUM 20 MG PO TBEC: 20.0000 mg | DELAYED_RELEASE_TABLET | Freq: Every day | ORAL | 1 refills | Status: DC

## 2020-12-21 MED ORDER — SUCRALFATE 1 G PO TABS: 1.0000 g | ORAL_TABLET | Freq: Four times a day (QID) | ORAL | 1 refills | Status: DC | PRN

## 2020-12-21 MED ORDER — ALUM & MAG HYDROXIDE-SIMETH 200-200-20 MG/5ML PO SUSP
30.0000 mL | Freq: Once | ORAL | Status: AC
  Administered 2020-12-21: 30 mL via ORAL
  Filled 2020-12-21: qty 30

## 2020-12-21 MED ORDER — DICYCLOMINE HCL 10 MG PO CAPS: 10.0000 mg | ORAL_CAPSULE | Freq: Three times a day (TID) | ORAL | 0 refills | Status: DC | PRN

## 2020-12-21 MED ORDER — ONDANSETRON 4 MG PO TBDP: ORAL_TABLET | ORAL | 0 refills | Status: DC

## 2020-12-21 NOTE — ED Provider Notes (Signed)
[facility] Emergency Department Provider Note  ____________________________________________   Event Date/Time   First MD Initiated Contact with Patient 12/21/20 0245     (approximate)  I have reviewed the triage vital signs and the nursing notes.   HISTORY  Chief Complaint Abdominal Pain    HPI [name] is a 50 y.o. female with medical history as listed below who presents for evaluation of persistent pain in her upper abdomen, constipation, and nausea vomiting after she eats.  The patient has had the symptoms for weeks to months.  She was seen almost a month ago and says that she has been trying an over-the-counter laxative or stool softener since then but it does not seem to help.  She describes the pain in her upper abdomen is burning, worse at night, nothing in particular makes it better or worse although she does not take a PPI or other antacid.  She frequently will have nausea and occasionally vomiting after she eats.  She has an appointment with a GI doctor in a couple of weeks but the symptoms were particularly bad tonight.  Nothing in particular makes them better.  She denies fever, lower abdominal pain, and dysuria.  She describes the symptoms as severe.     Past Medical History:  Diagnosis Date   Back pain    Cancer ([place])    + CANCER CELLS TO UTERUS   COPD (chronic obstructive pulmonary disease) (HCC)    Diabetes mellitus without complication ([place])    Family history of adverse reaction to anesthesia    FATHER STOPS BREATHING WITH ANESTHESIA   Headache    HX MIGRAINES   Seizures ([place])    1 SEIZURE  VERY STRESSED    Patient Active Problem List   Diagnosis Date Noted   Angina pectoris ([place]) 02/29/2020   Grade I hemorrhoids 07/27/2019   Degenerative disc disease, lumbar 08/24/2015    Past Surgical History:  Procedure Laterality Date   ABDOMINAL HYSTERECTOMY     APPENDECTOMY     BACK SURGERY     COLONOSCOPY WITH PROPOFOL  N/A 06/01/2019   Procedure: COLONOSCOPY WITH PROPOFOL;  Surgeon: Jonathon Bellows, MD;  Location: [facility] ENDOSCOPY;  Service: Gastroenterology;  Laterality: N/A;   LEFT HEART CATH AND CORONARY ANGIOGRAPHY Left 03/24/2020   Procedure: LEFT HEART CATH AND CORONARY ANGIOGRAPHY;  Surgeon: Corey Skains, MD;  Location: [place] CV LAB;  Service: Cardiovascular;  Laterality: Left;    Prior to Admission medications   Medication Sig Start Date End Date Taking? Authorizing Provider  dicyclomine (BENTYL) 10 MG capsule Take 1 capsule (10 mg total) by mouth 3 (three) times daily as needed for up to 14 days for spasms. or abdominal pain 12/21/20 01/04/21 Yes Hinda Kehr, MD  omeprazole (PRILOSEC OTC) 20 MG tablet Take 1 tablet (20 mg total) by mouth daily. 12/21/20 12/21/21 Yes Hinda Kehr, MD  ondansetron (ZOFRAN ODT) 4 MG disintegrating tablet Allow 1-2 tablets to dissolve in your mouth every 8 hours as needed for nausea/vomiting 12/21/20  Yes Hinda Kehr, MD  sucralfate (CARAFATE) 1 g tablet Take 1 tablet (1 g total) by mouth 4 (four) times daily as needed (for abdominal discomfort, nausea, and/or vomiting). 12/21/20  Yes Hinda Kehr, MD  Accu-Chek Softclix Lancets lancets Use as instructed once a daily DX E11.65 07/27/20   Lavera Guise, MD  amoxicillin-clavulanate (AUGMENTIN) 875-125 MG tablet Take 1 tablet by mouth 2 (two) times daily. 11/23/20   McDonough, Si Gaul, PA-C  aspirin EC 81 MG tablet Take 81 mg by mouth daily. Swallow whole.    [provider]  atorvastatin (LIPITOR) 10 MG tablet Take 1 tablet (10 mg total) by mouth daily. 02/17/20   Luiz Ochoa, NP  Blood Glucose Monitoring Suppl (ACCU-CHEK GUIDE ME) w/Device KIT Use as directed DX E11.65 07/27/20   Lavera Guise, MD  butalbital-acetaminophen-caffeine [facility]) [phone] MG tablet Take 1 tablet by mouth every 6 (six) hours as needed for headache. 07/20/20 07/20/21  Luiz Ochoa, NP  Cyanocobalamin (B-12 PO) Take 1 capsule by  mouth daily.    [provider]  dapagliflozin propanediol (FARXIGA) 10 MG TABS tablet Take 1 tablet (10 mg total) by mouth daily before breakfast. 11/23/20   McDonough, Lauren K, PA-C  escitalopram (LEXAPRO) 10 MG tablet Take 10 mg by mouth every evening. 03/05/20   [provider]  fluticasone (FLONASE) 50 MCG/ACT nasal spray Place 2 sprays into both nostrils daily. 06/05/20   Menshew, Dannielle Karvonen, PA-C  gabapentin (NEURONTIN) 100 MG capsule Take 1 capsule (100 mg total) by mouth 3 (three) times daily. 06/15/20   Luiz Ochoa, NP  glucose blood (ACCU-CHEK GUIDE) test strip Use as directed once a daily DX E11.65 07/27/20   Lavera Guise, MD  guaiFENesin (MUCINEX) 600 MG 12 hr tablet Take 1 tablet (600 mg total) by mouth 2 (two) times daily. 07/20/20   Luiz Ochoa, NP  hydrocortisone (ANUSOL-HC) 25 MG suppository Place 1 suppository (25 mg total) rectally 2 (two) times daily. Patient taking differently: Place 25 mg rectally 2 (two) times daily as needed for hemorrhoids. 07/27/19   Fredirick Maudlin, MD  ipratropium-albuterol (DUONEB) 0.5-2.5 (3) MG/3ML SOLN Take 3 mLs by nebulization every 6 (six) hours as needed. Patient taking differently: Take 3 mLs by nebulization 2 (two) times daily. 03/09/20   Luiz Ochoa, NP  lidocaine (XYLOCAINE) 2 % solution Use as directed 15 mLs in the mouth or throat every 4 (four) hours as needed for mouth pain. 07/21/20   Ward, Delice Bison, DO  metoprolol tartrate (LOPRESSOR) 25 MG tablet Take 25 mg by mouth daily.     [provider]  mometasone-formoterol (DULERA) 200-5 MCG/ACT AERO Inhale 2 puffs into the lungs 2 (two) times daily. 02/21/20   Lavera Guise, MD  nicotine (NICODERM CQ - DOSED IN MG/24 HR) 7 mg/24hr patch Place 7 mg onto the skin daily as needed (nicotine dependence).    [provider]  polyethylene glycol (MIRALAX) 17 g packet Take 17 g by mouth daily. 11/22/20   Sable Feil, PA-C  predniSONE (DELTASONE) 10 MG  tablet Take 1 tablet three times a day with a meal for three for three days, take 1 tablet by twice daily with a meal for 3 days, take 1 tablet once daily with a meal for 3 days 07/20/20   Luiz Ochoa, NP  venlafaxine XR (EFFEXOR XR) 37.5 MG 24 hr capsule Take 1 capsule (37.5 mg total) by mouth daily with breakfast. 02/17/20   Luiz Ochoa, NP    Allergies Chlorhexidine, Clindamycin/lincomycin, Demerol [meperidine], Elemental sulfur, and Morphine and related  Family History  Problem Relation Age of Onset   Breast cancer Maternal Grandmother 32    Social History Social History   Tobacco Use   Smoking status: Light Smoker    Packs/day: 0.50    Years: 28.00    Pack years: 14.00    Types: Cigarettes   Smokeless tobacco: Never  Tobacco comments:    smokes a few a day maybe  Vaping Use   Vaping Use: Never used  Substance Use Topics   Alcohol use: No    Alcohol/week: 0.0 standard drinks   Drug use: No    Review of Systems Constitutional: No fever/chills Eyes: No visual changes. ENT: No sore throat. Cardiovascular: Denies chest pain. Respiratory: Denies shortness of breath. Gastrointestinal: Persistent/chronic upper abdominal pain with frequent postprandial nausea and vomiting. Genitourinary: Negative for dysuria. Musculoskeletal: Negative for neck pain.  Negative for back pain. Integumentary: Negative for rash. Neurological: Negative for headaches, focal weakness or numbness.   ____________________________________________   PHYSICAL EXAM:  VITAL SIGNS: ED Triage Vitals  Enc Vitals Group     BP 12/20/20 2130 (!) 131/102     Pulse Rate 12/20/20 2130 (!) 102     Resp 12/20/20 2130 20     Temp 12/20/20 2130 98.7 F (37.1 C)     Temp Source 12/20/20 2130 Oral     SpO2 12/20/20 2130 100 %     Weight 12/20/20 2131 70.8 kg (156 lb)     Height 12/20/20 2131 1.626 m (5' 4")     Head Circumference --      Peak Flow --      Pain Score 12/20/20 2130 10     Pain  Loc --      Pain Edu? --      Excl. in [place]? --     Constitutional: Alert and oriented.  Eyes: Conjunctivae are normal.  Head: Atraumatic. Nose: No congestion/rhinnorhea. Mouth/Throat: Patient is wearing a mask. Neck: No stridor.  No meningeal signs.   Cardiovascular: Normal rate, regular rhythm. Good peripheral circulation. Respiratory: Normal respiratory effort.  No retractions. Gastrointestinal: Soft and nondistended.  Patient reports some tenderness to palpation in the epigastrium.  She does not have any tenderness in the right upper quadrant with negative Murphy sign.  No lower abdominal tenderness. Musculoskeletal: No lower extremity tenderness nor edema. No gross deformities of extremities. Neurologic:  Normal speech and language. No gross focal neurologic deficits are appreciated.  Skin:  Skin is warm, dry and intact. Psychiatric: Mood and affect are normal. Speech and behavior are normal.  ____________________________________________   LABS (all labs ordered are listed, but only abnormal results are displayed)  Labs Reviewed  COMPREHENSIVE METABOLIC PANEL - Abnormal; Notable for the following components:      Result Value   Potassium 3.4 (*)    Glucose, Bld 171 (*)    All other components within normal limits  CBC - Abnormal; Notable for the following components:   WBC 10.8 (*)    All other components within normal limits  LIPASE, BLOOD  URINALYSIS, COMPLETE (UACMP) WITH MICROSCOPIC  POC URINE PREG, ED   ____________________________________________   RADIOLOGY I, Hinda Kehr, personally viewed and evaluated these images (plain radiographs) as part of my medical decision making, as well as reviewing the written report by the radiologist.  ED MD interpretation: No acute abnormalities on x-ray  Official radiology report(s): DG Abdomen 1 View  Result Date: 12/20/2020 CLINICAL DATA:  Abdominal pain, constipation EXAM: ABDOMEN - 1 VIEW COMPARISON:  11/22/2020  FINDINGS: Nonobstructive bowel gas pattern. No excessive stool burden. No organomegaly, free air or suspicious calcification. Postoperative changes in the lower lumbar spine. IMPRESSION: No acute findings. Electronically Signed   By: Rolm Baptise M.D.   On: 12/20/2020 22:22    ____________________________________________   INITIAL IMPRESSION / MDM / [place] / ED  COURSE  As part of my medical decision making, I reviewed the following data within the [place] notes reviewed and incorporated, Labs reviewed , Old chart reviewed, Radiograph reviewed , and Notes from prior ED visits   Differential diagnosis includes, but is not limited to, acid reflux, functional dyspepsia, nonspecific gastritis, intermittent gastroparesis, less likely biliary colic.  Patient is well-appearing and in no distress.  Very mildly elevated blood pressure, otherwise reassuring  vital signs.  Lab work remains stable from a month ago with an essentially normal comprehensive metabolic panel other than a very slightly decreased potassium, essentially normal CBC, normal lipase.  Of note, there is no evidence of LFT elevation to suggest biliary obstruction.  Physical exam is equally reassuring.  Even though she has some tenderness palpation of the epigastrium, she has no right upper quadrant tenderness and a negative Murphy sign.  I reviewed her medical record and see that she had a CT scan approximately 9 months ago outside of the [facility] system but I could see the results and she had a normal hepatic/biliary system at that time.  I think it is very unlikely that she is developed gallstones since then, particularly given that she has had months of abdominal issues.  Additionally, even if she had some stones, there is no indication for emergent intervention given her stable vital signs and lack of evidence of obstruction with normal LFTs and a normal lipase.  It Is much more likely this is due to  some persistent GERD and dyspepsia.  I personally reviewed the patient's imaging and agree with the radiologist's interpretation that her abdominal x-ray is normal.  I spoke with the patient and offered the medications listed below both in the ED and as prescriptions.  She has follow-up scheduled with GI and she can also follow-up with her PCP.  I gave my usual and customary return precautions and she understands and agrees with the plan.         ____________________________________________  FINAL CLINICAL IMPRESSION(S) / ED DIAGNOSES  Final diagnoses:  Epigastric pain  Postprandial vomiting  Constipation, unspecified constipation type     MEDICATIONS GIVEN DURING THIS VISIT:  Medications  dicyclomine (BENTYL) capsule 10 mg (has no administration in time range)  alum & mag hydroxide-simeth (MAALOX/MYLANTA) 200-200-20 MG/5ML suspension 30 mL (has no administration in time range)  ondansetron (ZOFRAN-ODT) disintegrating tablet 4 mg (has no administration in time range)     ED Discharge Orders          Ordered    dicyclomine (BENTYL) 10 MG capsule  3 times daily PRN        12/21/20 0300    sucralfate (CARAFATE) 1 g tablet  4 times daily PRN        12/21/20 0300    omeprazole (PRILOSEC OTC) 20 MG tablet  Daily        12/21/20 0300    ondansetron (ZOFRAN ODT) 4 MG disintegrating tablet        12/21/20 0300             Note:  This document was prepared using Dragon voice recognition software and may include unintentional dictation errors.   Hinda Kehr, MD 12/21/20 [phone]

## 2020-12-21 NOTE — ED Notes (Signed)
Pt assessed by provider prior to d/c

## 2020-12-21 NOTE — Discharge Instructions (Addendum)
As we discussed, your work-up was generally reassuring.  Unfortunately there does not seem to be anything that we can "fix" at this time, but we understand that your symptoms continue to be very frustrating for you.  Please consider trying the medications prescribed to help with your symptoms, including an acid reducer (Prilosec), nausea medicine (Zofran), Carafate (a medication that helps soothe your stomach), and Bentyl (good for abdominal discomfort).    Given your ongoing issues with constipation, please also consider using the over-the-counter medications listed below in the order listed, as these medications can work together well to provide relief from your constipation.  Please try these medications and therapies and follow-up with your GI doctor as scheduled and with your primary care doctor as needed.  Return to the emergency department if you develop new or worsening symptoms that concern you.  Over-the-counter constipation remedies (use one or more of the following; they can be used together safely):  1)  Miralax (powder):  This medication works by drawing additional fluid into your intestines and helps to flush out your stool.  Mix the powder with water or juice according to label instructions.  Be sure to use the recommended amount of water or juice when you mix up the powder.  Plenty of fluids will help to prevent constipation.  2)  Colace (or Dulcolax) 100 mg:  This is a stool softener, and you may take it once or twice a day as needed.  3)  Senna tablets:  This is a bowel stimulant that will help "push" out your stool. It is the next step to add after you have tried a stool softener.  If the three options above are not working, even when you use them together, look for magnesium citrate at the pharmacy (it is usually in a small glass bottle).  Drink the bottle according to the label instructions.  You may also want to consider using glycerin suppositories, which you insert into your  rectum.  You hold it in place and is dissolves and softens your stool and stimulates your bowels.  You could also consider using an enema, which is also available over the counter.  Remember that narcotic pain medications are constipating, so avoid them or minimize their use.  Drink plenty of fluids.

## 2021-01-01 ENCOUNTER — Ambulatory Visit: Payer: Self-pay | Admitting: Physician Assistant

## 2021-01-01 ENCOUNTER — Encounter: Payer: Self-pay | Admitting: Physician Assistant

## 2021-01-01 ENCOUNTER — Other Ambulatory Visit: Payer: Self-pay

## 2021-01-01 DIAGNOSIS — G4734 Idiopathic sleep related nonobstructive alveolar hypoventilation: Secondary | ICD-10-CM

## 2021-01-01 DIAGNOSIS — K5901 Slow transit constipation: Secondary | ICD-10-CM

## 2021-01-01 DIAGNOSIS — Z1231 Encounter for screening mammogram for malignant neoplasm of breast: Secondary | ICD-10-CM

## 2021-01-01 DIAGNOSIS — R1084 Generalized abdominal pain: Secondary | ICD-10-CM

## 2021-01-01 DIAGNOSIS — F17209 Nicotine dependence, unspecified, with unspecified nicotine-induced disorders: Secondary | ICD-10-CM

## 2021-01-01 DIAGNOSIS — R0602 Shortness of breath: Secondary | ICD-10-CM

## 2021-01-01 DIAGNOSIS — R911 Solitary pulmonary nodule: Secondary | ICD-10-CM

## 2021-01-01 DIAGNOSIS — E1165 Type 2 diabetes mellitus with hyperglycemia: Secondary | ICD-10-CM

## 2021-01-01 DIAGNOSIS — J449 Chronic obstructive pulmonary disease, unspecified: Secondary | ICD-10-CM

## 2021-01-01 LAB — POCT UA - MICROALBUMIN
Creatinine, POC: 300 mg/dL
Microalbumin Ur, POC: 30 mg/L

## 2021-01-01 NOTE — Progress Notes (Signed)
[facility] [place], [place] [address]  Internal MEDICINE  Office Visit Note  Patient Name: [name]  [id number]  [id number]  Date of Service: 01/02/2021  Chief Complaint  Patient presents with   Follow-up    GI problems, constipation    Diabetes   COPD    HPI Pt is here for routine follow up -She has an appt July 19th to see GI  -Constant burning abdominal pain and constipation. Did not go to work Friday, Saturday, Wednesday. Taking miralax, bentyl, and carafate, which are not really helping. Can eat but is throwing some of it up. Metalic taste in her mouth after she eats. Able to keep water down and some foods, just intermittent vomiting. Went to ED on 6/30 due to the burning and pain. Had imaging in the ED-negative abdominal Xray. Went to [place] ED when she was d/c from [place] without feeling better and BP elevated and still concerned. Also concerned about losing her job due to inability to meet production goals with having pain and needing to get up to restroom frequently. Will give note excusing from work until GI visit. -Uses dulera, has some wheezing at times, but has not had to use albuterol. Due for PFT -oxygen at night -smoking 1/2ppd, due for lung CT -Due for mammogram next month, will go ahead and put order in  Current Medication: Outpatient Encounter Medications as of 01/01/2021  Medication Sig   Accu-Chek Softclix Lancets lancets Use as instructed once a daily DX E11.65   aspirin EC 81 MG tablet Take 81 mg by mouth daily. Swallow whole.   atorvastatin (LIPITOR) 10 MG tablet Take 1 tablet (10 mg total) by mouth daily.   Blood Glucose Monitoring Suppl (ACCU-CHEK GUIDE ME) w/Device KIT Use as directed DX E11.65   butalbital-acetaminophen-caffeine (FIORICET) 50-325-40 MG tablet Take 1 tablet by mouth every 6 (six) hours as needed for headache.   dicyclomine (BENTYL) 10 MG capsule Take 1 capsule (10 mg total) by mouth 3 (three)  times daily as needed for up to 14 days for spasms. or abdominal pain   escitalopram (LEXAPRO) 10 MG tablet Take 10 mg by mouth every evening.   glucose blood (ACCU-CHEK GUIDE) test strip Use as directed once a daily DX E11.65   hydrocortisone (ANUSOL-HC) 25 MG suppository Place 1 suppository (25 mg total) rectally 2 (two) times daily. (Patient taking differently: Place 25 mg rectally 2 (two) times daily as needed for hemorrhoids.)   ipratropium-albuterol (DUONEB) 0.5-2.5 (3) MG/3ML SOLN Take 3 mLs by nebulization every 6 (six) hours as needed. (Patient taking differently: Take 3 mLs by nebulization 2 (two) times daily.)   metoprolol tartrate (LOPRESSOR) 25 MG tablet Take 25 mg by mouth daily.    mometasone-formoterol (DULERA) 200-5 MCG/ACT AERO Inhale 2 puffs into the lungs 2 (two) times daily.   polyethylene glycol (MIRALAX) 17 g packet Take 17 g by mouth daily.   sucralfate (CARAFATE) 1 g tablet Take 1 tablet (1 g total) by mouth 4 (four) times daily as needed (for abdominal discomfort, nausea, and/or vomiting).   [DISCONTINUED] amoxicillin-clavulanate (AUGMENTIN) 875-125 MG tablet Take 1 tablet by mouth 2 (two) times daily. (Patient not taking: Reported on 01/01/2021)   [DISCONTINUED] Cyanocobalamin (B-12 PO) Take 1 capsule by mouth daily. (Patient not taking: Reported on 01/01/2021)   [DISCONTINUED] dapagliflozin propanediol (FARXIGA) 10 MG TABS tablet Take 1 tablet (10 mg total) by mouth daily before breakfast. (Patient not taking: Reported on 01/01/2021)   [DISCONTINUED] fluticasone (  FLONASE) 50 MCG/ACT nasal spray Place 2 sprays into both nostrils daily. (Patient not taking: Reported on 01/01/2021)   [DISCONTINUED] gabapentin (NEURONTIN) 100 MG capsule Take 1 capsule (100 mg total) by mouth 3 (three) times daily. (Patient not taking: Reported on 01/01/2021)   [DISCONTINUED] guaiFENesin (MUCINEX) 600 MG 12 hr tablet Take 1 tablet (600 mg total) by mouth 2 (two) times daily. (Patient not taking:  Reported on 01/01/2021)   [DISCONTINUED] lidocaine (XYLOCAINE) 2 % solution Use as directed 15 mLs in the mouth or throat every 4 (four) hours as needed for mouth pain. (Patient not taking: Reported on 01/01/2021)   [DISCONTINUED] nicotine (NICODERM CQ - DOSED IN MG/24 HR) 7 mg/24hr patch Place 7 mg onto the skin daily as needed (nicotine dependence). (Patient not taking: Reported on 01/01/2021)   [DISCONTINUED] omeprazole (PRILOSEC OTC) 20 MG tablet Take 1 tablet (20 mg total) by mouth daily. (Patient not taking: Reported on 01/01/2021)   [DISCONTINUED] ondansetron (ZOFRAN ODT) 4 MG disintegrating tablet Allow 1-2 tablets to dissolve in your mouth every 8 hours as needed for nausea/vomiting (Patient not taking: Reported on 01/01/2021)   [DISCONTINUED] predniSONE (DELTASONE) 10 MG tablet Take 1 tablet three times a day with a meal for three for three days, take 1 tablet by twice daily with a meal for 3 days, take 1 tablet once daily with a meal for 3 days (Patient not taking: Reported on 01/01/2021)   [DISCONTINUED] venlafaxine XR (EFFEXOR XR) 37.5 MG 24 hr capsule Take 1 capsule (37.5 mg total) by mouth daily with breakfast. (Patient not taking: Reported on 01/01/2021)   No facility-administered encounter medications on file as of 01/01/2021.    Surgical History: Past Surgical History:  Procedure Laterality Date   ABDOMINAL HYSTERECTOMY     APPENDECTOMY     BACK SURGERY     COLONOSCOPY WITH PROPOFOL N/A 06/01/2019   Procedure: COLONOSCOPY WITH PROPOFOL;  Surgeon: Jonathon Bellows, MD;  Location: [facility] ENDOSCOPY;  Service: Gastroenterology;  Laterality: N/A;   LEFT HEART CATH AND CORONARY ANGIOGRAPHY Left 03/24/2020   Procedure: LEFT HEART CATH AND CORONARY ANGIOGRAPHY;  Surgeon: Corey Skains, MD;  Location: [facility];  Service: Cardiovascular;  Laterality: Left;    Medical History: Past Medical History:  Diagnosis Date   Back pain    Cancer ([place])    + CANCER CELLS TO UTERUS   COPD  (chronic obstructive pulmonary disease) ([place])    Diabetes mellitus without complication (HCC)    Family history of adverse reaction to anesthesia    FATHER STOPS BREATHING WITH ANESTHESIA   Headache    HX MIGRAINES   Seizures ([place])    1 SEIZURE  VERY STRESSED    Family History: Family History  Problem Relation Age of Onset   Breast cancer Maternal Grandmother 68    Social History   Socioeconomic History   Marital status: Single    Spouse name: Not on file   Number of children: 2   Years of education: Not on file   Highest education level: Not on file  Occupational History   Not on file  Tobacco Use   Smoking status: Light Smoker    Packs/day: 0.50    Years: 28.00    Pack years: 14.00    Types: Cigarettes   Smokeless tobacco: Never   Tobacco comments:    smokes a few a day maybe  Vaping Use   Vaping Use: Never used  Substance and Sexual Activity   Alcohol use: No  Alcohol/week: 0.0 standard drinks   Drug use: No   Sexual activity: Not on file  Other Topics Concern   Not on file  Social History Narrative   Lives with boyfriend    Social Determinants of Health   Financial Resource Strain: Not on file  Food Insecurity: Not on file  Transportation Needs: Not on file  Physical Activity: Not on file  Stress: Not on file  Social Connections: Not on file  Intimate Partner Violence: Not on file      Review of Systems  Constitutional:  Negative for chills, fatigue and unexpected weight change.  HENT:  Negative for congestion, postnasal drip, rhinorrhea, sneezing and sore throat.   Eyes:  Negative for redness.  Respiratory:  Positive for wheezing. Negative for cough, chest tightness and shortness of breath.   Cardiovascular:  Negative for chest pain and palpitations.  Gastrointestinal:  Positive for abdominal distention, abdominal pain, constipation, nausea and vomiting. Negative for blood in stool and diarrhea.  Genitourinary:  Negative for dysuria and  frequency.  Musculoskeletal:  Negative for arthralgias, back pain, joint swelling and neck pain.  Skin:  Negative for rash.  Neurological: Negative.  Negative for tremors and numbness.  Hematological:  Negative for adenopathy. Does not bruise/bleed easily.  Psychiatric/Behavioral:  Negative for behavioral problems (Depression), sleep disturbance and suicidal ideas. The patient is nervous/anxious.    Vital Signs: BP 110/88   Pulse 85   Temp (!) 97.3 F (36.3 C)   Resp 16   Ht _0  (1.626 m)   Wt 164 lb (74.4 kg)   SpO2 99%   BMI 28.15 kg/m    Physical Exam Vitals and nursing note reviewed.  Constitutional:      General: She is not in acute distress.    Appearance: She is well-developed. She is not diaphoretic.  HENT:     Head: Normocephalic and atraumatic.     Mouth/Throat:     Pharynx: No oropharyngeal exudate.  Eyes:     Pupils: Pupils are equal, round, and reactive to light.  Neck:     Thyroid: No thyromegaly.     Vascular: No JVD.     Trachea: No tracheal deviation.  Cardiovascular:     Rate and Rhythm: Normal rate and regular rhythm.     Heart sounds: Normal heart sounds. No murmur heard.   No friction rub. No gallop.  Pulmonary:     Effort: Pulmonary effort is normal. No respiratory distress.     Breath sounds: No wheezing or rales.  Chest:     Chest wall: No tenderness.  Abdominal:     General: Bowel sounds are normal. There is no distension.     Palpations: Abdomen is soft.     Tenderness: There is abdominal tenderness.  Musculoskeletal:        General: Normal range of motion.     Cervical back: Normal range of motion and neck supple.  Lymphadenopathy:     Cervical: No cervical adenopathy.  Skin:    General: Skin is warm and dry.  Neurological:     Mental Status: She is alert and oriented to person, place, and time.     Cranial Nerves: No cranial nerve deficit.  Psychiatric:        Behavior: Behavior normal.        Thought Content: Thought content  normal.        Judgment: Judgment normal.       Assessment/Plan: 1. Constipation by delayed colonic transit Continue  current medications and increase fiber--appt with GI on 19th  2. Generalized abdominal discomfort Continue current medications and increase fiber--appt with GI on 19th  3. Uncontrolled type 2 diabetes mellitus with hyperglycemia (HCC) Continue current medications and working on diet and exercise - POCT UA - Microalbumin  4. Chronic obstructive pulmonary disease, unspecified COPD type ([place]) Stable, continue inhalers as indicated. Due for PFT and CT chest screening - Pulmonary Function Test; Future - CT CHEST WO CONTRAST; Future  5. Shortness of breath Due for CT chest screening and PFT - CT CHEST WO CONTRAST; Future  6. Nocturnal hypoxia Continue nightly oxygen  7. Lung nodule Will check CT chest for monitoring and order PFT - Pulmonary Function Test; Future  8. Tobacco use disorder, continuous Smoking cessation counseling: Pt acknowledges the risks of long term smoking, she will try to quite smoking. Options for different medications including nicotine products, chewing gum, patch etc, Wellbutrin and Chantix is discussed Goal and date of compete cessation is discussed Total time spent in smoking cessation is 10 min.  - Pulmonary Function Test; Future  9. Visit for screening mammogram - MM DIGITAL SCREENING BILATERAL; Future   General Counseling: miaya lafontant understanding of the findings of todays visit and agrees with plan of treatment. I have discussed any further diagnostic evaluation that may be needed or ordered today. We also reviewed her medications today. she has been encouraged to call the office with any questions or concerns that should arise related to todays visit.    Orders Placed This Encounter  Procedures   MM DIGITAL SCREENING BILATERAL   CT CHEST WO CONTRAST   POCT UA - Microalbumin   Pulmonary Function Test    No orders  of the defined types were placed in this encounter.   This patient was seen by Drema Dallas, PA-C in collaboration with Dr. Clayborn Bigness as a part of collaborative care agreement.   Total time spent:40 Minutes Time spent includes review of chart, medications, test results, and follow up plan with the patient.      Dr Lavera Guise Internal medicine

## 2021-01-08 ENCOUNTER — Other Ambulatory Visit: Payer: Self-pay

## 2021-01-09 ENCOUNTER — Other Ambulatory Visit: Payer: Self-pay

## 2021-01-09 ENCOUNTER — Ambulatory Visit (INDEPENDENT_AMBULATORY_CARE_PROVIDER_SITE_OTHER): Payer: PRIVATE HEALTH INSURANCE | Admitting: Gastroenterology

## 2021-01-09 ENCOUNTER — Encounter: Payer: Self-pay | Admitting: Gastroenterology

## 2021-01-09 VITALS — BP 115/84 | HR 90 | Temp 97.7°F | Ht 64.0 in | Wt 163.1 lb

## 2021-01-09 DIAGNOSIS — K5909 Other constipation: Secondary | ICD-10-CM

## 2021-01-09 MED ORDER — GOLYTELY 236 G PO SOLR: 4000.0000 mL | Freq: Once | ORAL | 0 refills | Status: AC

## 2021-01-09 NOTE — Patient Instructions (Addendum)
Please take Golytely today. Fill to the fill line with Gatorade or water and mix together. Drink 8oz every 20 to 30 minutes till gone.  High-Fiber Eating Plan Fiber, also called dietary fiber, is a type of carbohydrate. It is found foods such as fruits, vegetables, whole grains, and beans. A high-fiber diet can have many health benefits. Your health care provider may recommend a high-fiber diet to help: Prevent constipation. Fiber can make your bowel movements more regular. Lower your cholesterol. Relieve the following conditions: Inflammation of veins in the anus (hemorrhoids). Inflammation of specific areas of the digestive tract (uncomplicated diverticulosis). A problem of the large intestine, also called the colon, that sometimes causes pain and diarrhea (irritable bowel syndrome, or IBS). Prevent overeating as part of a weight-loss plan. Prevent heart disease, type 2 diabetes, and certain cancers. What are tips for following this plan? Reading food labels  Check the nutrition facts label on food products for the amount of dietary fiber. Choose foods that have 5 grams of fiber or more per serving. The goals for recommended daily fiber intake include: Men (age 83 or younger): 34-38 g. Men (over age 15): 28-34 g. Women (age 38 or younger): 25-28 g. Women (over age 64): 22-25 g. Your daily fiber goal is _____________ g. Shopping Choose whole fruits and vegetables instead of processed forms, such as apple juice or applesauce. Choose a wide variety of high-fiber foods such as avocados, lentils, oats, and kidney beans. Read the nutrition facts label of the foods you choose. Be aware of foods with added fiber. These foods often have high sugar and sodium amounts per serving. Cooking Use whole-grain flour for baking and cooking. Cook with brown rice instead of white rice. Meal planning Start the day with a breakfast that is high in fiber, such as a cereal that contains 5 g of fiber or more  per serving. Eat breads and cereals that are made with whole-grain flour instead of refined flour or white flour. Eat brown rice, bulgur wheat, or millet instead of white rice. Use beans in place of meat in soups, salads, and pasta dishes. Be sure that half of the grains you eat each day are whole grains. General information You can get the recommended daily intake of dietary fiber by: Eating a variety of fruits, vegetables, grains, nuts, and beans. Taking a fiber supplement if you are not able to take in enough fiber in your diet. It is better to get fiber through food than from a supplement. Gradually increase how much fiber you consume. If you increase your intake of dietary fiber too quickly, you may have bloating, cramping, or gas. Drink plenty of water to help you digest fiber. Choose high-fiber snacks, such as berries, raw vegetables, nuts, and popcorn. What foods should I eat? Fruits Berries. Pears. Apples. Oranges. Avocado. Prunes and raisins. Dried figs. Vegetables Sweet potatoes. Spinach. Kale. Artichokes. Cabbage. Broccoli. Cauliflower.Green peas. Carrots. Squash. Grains Whole-grain breads. Multigrain cereal. Oats and oatmeal. Brown rice. Barley.Bulgur wheat. [place]. Quinoa. Bran muffins. Popcorn. Rye wafer crackers. Meats and other proteins Navy beans, kidney beans, and pinto beans. Soybeans. Split peas. Lentils. Nutsand seeds. Dairy Fiber-fortified yogurt. Beverages Fiber-fortified soy milk. Fiber-fortified orange juice. Other foods Fiber bars. The items listed above may not be a complete list of recommended foods and beverages. Contact a dietitian for more information. What foods should I avoid? Fruits Fruit juice. Cooked, strained fruit. Vegetables Fried potatoes. Canned vegetables. Well-cooked vegetables. Grains White bread. Pasta made with refined flour. White  rice. Meats and other proteins Fatty cuts of meat. Fried chicken or fried fish. Dairy Milk. Yogurt.  Cream cheese. Sour cream. Fats and oils Butters. Beverages Soft drinks. Other foods Cakes and pastries. The items listed above may not be a complete list of foods and beverages to avoid. Talk with your dietitian about what choices are best for you. Summary Fiber is a type of carbohydrate. It is found in foods such as fruits, vegetables, whole grains, and beans. A high-fiber diet has many benefits. It can help to prevent constipation, lower blood cholesterol, aid weight loss, and reduce your risk of heart disease, diabetes, and certain cancers. Increase your intake of fiber gradually. Increasing fiber too quickly may cause cramping, bloating, and gas. Drink plenty of water while you increase the amount of fiber you consume. The best sources of fiber include whole fruits and vegetables, whole grains, nuts, seeds, and beans. This information is not intended to replace advice given to you by your health care provider. Make sure you discuss any questions you have with your healthcare provider. Document Revised: 10/14/2019 Document Reviewed: 10/14/2019 Elsevier Patient Education  2022 [employer].

## 2021-01-09 NOTE — Progress Notes (Signed)
Cephas Darby, MD [address]  [place]  [place], [place] [address]  Main: [phone]  Fax: [phone]    Gastroenterology Consultation  Referring Provider:     Mylinda Latina, PA* Primary Care Physician:  Lavera Guise, MD Primary Gastroenterologist:  Dr. Jonathon Bellows Reason for Consultation:     Chronic constipation, left-sided abdominal pain        HPI:   [name] is a 50 y.o. female referred by Dr. Humphrey Rolls, Timoteo Gaul, MD  for consultation & management of chronic constipation and left-sided abdominal pain.  Patient reports that she has been suffering from severe constipation for several years.  She reports that she does not have a bowel movement for several weeks, sometimes up to a month.  Constipation is associated with significant abdominal bloating, left lower quadrant pain as well as left upper quadrant pain, nausea.  She denies any rectal bleeding.  She has tried magnesium citrate, MiraLAX prep in the past.  Patient went to ER 2 weeks ago due to severe constipation, then went to [facility].  She was given GoLytely for cleanout.  She also had cross-sectional imaging of the CT scan with no acute intra-abdominal pathology identified.  Labs were unremarkable including CBC, CMP, serum lipase levels.  Patient does acknowledge consumption of carbonated beverages daily and red meat few times a week.  She does smoke cigarettes.  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  TSH was normal in 2021 Patient denies any complications during pregnancy and delivery, she has 2 children  Father with colon cancer at age 19  NSAIDs: None  Antiplts/Anticoagulants/Anti thrombotics: None  GI Procedures:  colonoscopy 06/01/2019 - One 5 mm polyp in the descending colon, removed with a cold snare. Complete resection. Polyp tissue not retrieved. - One 10 mm polyp in the proximal ascending colon, removed with a cold snare. Resected and retrieved. Clip was placed. - One 5  mm polyp in the cecum, removed with a cold snare. Resected and retrieved. - One 3 mm polyp in the sigmoid colon, removed with a cold biopsy forceps. Resected and retrieved. - The examination was otherwise normal on direct and retroflexion views.  DIAGNOSIS:  A. COLON POLYP, SIGMOID; BIOPSY:  - POLYPOID BENIGN COLONIC MUCOSA WITH FOCAL REACTIVE LYMPHOPLASMACYTIC  AGGREGATE.  - NEGATIVE FOR DYSPLASIA AND MALIGNANCY.   B. COLON POLYP, ASCENDING; BIOPSY:  - FRAGMENTS OF SESSILE SERRATED POLYP.  - NEGATIVE FOR DYSPLASIA AND MALIGNANCY  - BENIGN SUBMUCOSAL ADIPOSE TISSUE PRESENT IN A FEW BIOPSY FRAGMENTS.   C. COLON POLYP, CECUM; BIOPSY:  - POLYPOID BENIGN COLONIC MUCOSA.  - NEGATIVE FOR DYSPLASIA AND MALIGNANCY.   Past Medical History:  Diagnosis Date   Back pain    Cancer ([place])    + CANCER CELLS TO UTERUS   COPD (chronic obstructive pulmonary disease) ([place])    Diabetes mellitus without complication ([place])    Family history of adverse reaction to anesthesia    FATHER STOPS BREATHING WITH ANESTHESIA   Headache    HX MIGRAINES   Seizures ([place])    1 SEIZURE  VERY STRESSED    Past Surgical History:  Procedure Laterality Date   ABDOMINAL HYSTERECTOMY     APPENDECTOMY     BACK SURGERY     COLONOSCOPY WITH PROPOFOL N/A 06/01/2019   Procedure: COLONOSCOPY WITH PROPOFOL;  Surgeon: Jonathon Bellows, MD;  Location: [facility] ENDOSCOPY;  Service: Gastroenterology;  Laterality: N/A;   LEFT HEART CATH  AND CORONARY ANGIOGRAPHY Left 03/24/2020   Procedure: LEFT HEART CATH AND CORONARY ANGIOGRAPHY;  Surgeon: Corey Skains, MD;  Location: [place] CV LAB;  Service: Cardiovascular;  Laterality: Left;   Current Outpatient Medications:    Accu-Chek Softclix Lancets lancets, Use as instructed once a daily DX E11.65, Disp: 100 each, Rfl: 1   aspirin EC 81 MG tablet, Take 81 mg by mouth daily. Swallow whole., Disp: , Rfl:    atorvastatin (LIPITOR) 10 MG tablet, Take 1 tablet (10 mg total) by mouth  daily., Disp: 90 tablet, Rfl: 3   Blood Glucose Monitoring Suppl (ACCU-CHEK GUIDE ME) w/Device KIT, Use as directed DX E11.65, Disp: 1 kit, Rfl: 0   butalbital-acetaminophen-caffeine (FIORICET) 50-325-40 MG tablet, Take 1 tablet by mouth every 6 (six) hours as needed for headache., Disp: 20 tablet, Rfl: 0   dicyclomine (BENTYL) 10 MG capsule, Take 1 capsule (10 mg total) by mouth 3 (three) times daily as needed for up to 14 days for spasms. or abdominal pain, Disp: 30 capsule, Rfl: 0   glucose blood (ACCU-CHEK GUIDE) test strip, Use as directed once a daily DX E11.65, Disp: 100 each, Rfl: 1   ipratropium-albuterol (DUONEB) 0.5-2.5 (3) MG/3ML SOLN, Take 3 mLs by nebulization every 6 (six) hours as needed. (Patient taking differently: Take 3 mLs by nebulization 2 (two) times daily.), Disp: 360 mL, Rfl: 1   mometasone-formoterol (DULERA) 200-5 MCG/ACT AERO, Inhale 2 puffs into the lungs 2 (two) times daily., Disp: 1 each, Rfl: 3   polyethylene glycol (GOLYTELY) 236 g solution, Take 4,000 mLs by mouth once for 1 dose., Disp: 4000 mL, Rfl: 0   polyethylene glycol (MIRALAX) 17 g packet, Take 17 g by mouth daily., Disp: 14 each, Rfl: 0   sucralfate (CARAFATE) 1 g tablet, Take 1 tablet (1 g total) by mouth 4 (four) times daily as needed (for abdominal discomfort, nausea, and/or vomiting)., Disp: 30 tablet, Rfl: 1    Family History  Problem Relation Age of Onset   Breast cancer Maternal Grandmother 45     Social History   Tobacco Use   Smoking status: Light Smoker    Packs/day: 0.50    Years: 28.00    Pack years: 14.00    Types: Cigarettes   Smokeless tobacco: Never   Tobacco comments:    smokes a few a day maybe  Vaping Use   Vaping Use: Never used  Substance Use Topics   Alcohol use: No    Alcohol/week: 0.0 standard drinks   Drug use: No    Allergies as of 01/09/2021 - Review Complete 01/09/2021  Allergen Reaction Noted   Chlorhexidine Itching 08/24/2015   Clindamycin/lincomycin Rash  03/24/2020   Demerol [meperidine] Rash 05/26/2015   Elemental sulfur Rash 11/26/2018   Morphine and related Hives and Rash 05/26/2015    Review of Systems:    All systems reviewed and negative except where noted in HPI.   Physical Exam:  BP 115/84 (BP Location: Left Arm, Patient Position: Sitting, Cuff Size: Normal)   Pulse 90   Temp 97.7 F (36.5 C) (Oral)   Ht '5\' 4"'  (1.626 m)   Wt 163 lb 2 oz (74 kg)   BMI 28.00 kg/m  No LMP recorded. Patient has had a hysterectomy.  General:   Alert,  Well-developed, well-nourished, pleasant and cooperative in NAD Head:  Normocephalic and atraumatic. Eyes:  Sclera clear, no icterus.   Conjunctiva pink. Ears:  Normal auditory acuity. Nose:  No deformity, discharge, or lesions.  Mouth:  No deformity or lesions,oropharynx pink & moist. Neck:  Supple; no masses or thyromegaly. Lungs:  Respirations even and unlabored.  Clear throughout to auscultation.   No wheezes, crackles, or rhonchi. No acute distress. Heart:  Regular rate and rhythm; no murmurs, clicks, rubs, or gallops. Abdomen:  Normal bowel sounds. Soft, diffusely distended tympanic to percussion, mild left lower quadrant tenderness, without masses, hepatosplenomegaly or hernias noted.  No guarding or rebound tenderness.   Rectal: Not performed Msk:  Symmetrical without gross deformities. Good, equal movement & strength bilaterally. Pulses:  Normal pulses noted. Extremities:  No clubbing or edema.  No cyanosis. Neurologic:  Alert and oriented x3;  grossly normal neurologically. Skin:  Intact without significant lesions or rashes. No jaundice. Psych:  Alert and cooperative. Normal mood and affect.  Imaging Studies: Reviewed  Assessment and Plan:   [name] is a 50 y.o. female with history of hyperlipidemia, chronic tobacco use, diabetes, COPD is seen in consultation for severe chronic constipation  Chronic constipation: Patient's diet is devoid of fiber Discussed about  high-fiber diet, information provided Recommend bowel cleanout with GoLytely followed by trial of Linzess 290 MCG daily, samples provided Will give letter for out of work for today and tomorrow  Patient had a colonoscopy with adequate prep in 05/2019 Solitary subcentimeter sessile serrated polyp on pathology Recommend surveillance colonoscopy in 05/2024 and every 5 years minimum given family history of colon cancer in patient's father at age 65   Follow up in 79 to 67 weeks   Cephas Darby, MD

## 2021-01-11 ENCOUNTER — Telehealth: Payer: Self-pay | Admitting: Gastroenterology

## 2021-01-11 NOTE — Telephone Encounter (Signed)
Patient unable to go to work today due to bowel issues. She is asking to extend her work note.

## 2021-01-12 NOTE — Telephone Encounter (Signed)
Called and left patient a detail message that letter was on her mychart

## 2021-01-12 NOTE — Telephone Encounter (Signed)
Wrote letter for patient.

## 2021-01-23 ENCOUNTER — Telehealth: Payer: Self-pay

## 2021-01-23 NOTE — Telephone Encounter (Signed)
Left vm for patient to return call with new insurance information-Toni

## 2021-01-29 ENCOUNTER — Other Ambulatory Visit: Payer: Self-pay | Admitting: Physician Assistant

## 2021-01-29 DIAGNOSIS — Z1231 Encounter for screening mammogram for malignant neoplasm of breast: Secondary | ICD-10-CM

## 2021-01-31 ENCOUNTER — Ambulatory Visit: Payer: BLUE CROSS/BLUE SHIELD | Admitting: Internal Medicine

## 2021-01-31 ENCOUNTER — Other Ambulatory Visit: Payer: Self-pay

## 2021-01-31 DIAGNOSIS — R0602 Shortness of breath: Secondary | ICD-10-CM | POA: Diagnosis not present

## 2021-01-31 DIAGNOSIS — F17209 Nicotine dependence, unspecified, with unspecified nicotine-induced disorders: Secondary | ICD-10-CM

## 2021-01-31 DIAGNOSIS — R911 Solitary pulmonary nodule: Secondary | ICD-10-CM

## 2021-01-31 DIAGNOSIS — J449 Chronic obstructive pulmonary disease, unspecified: Secondary | ICD-10-CM

## 2021-01-31 LAB — PULMONARY FUNCTION TEST

## 2021-02-07 ENCOUNTER — Ambulatory Visit: Payer: BLUE CROSS/BLUE SHIELD | Admitting: Gastroenterology

## 2021-02-07 ENCOUNTER — Other Ambulatory Visit: Payer: Self-pay

## 2021-02-07 ENCOUNTER — Encounter: Payer: Self-pay | Admitting: Gastroenterology

## 2021-02-07 VITALS — BP 114/80 | HR 82 | Temp 98.0°F | Ht 64.0 in | Wt 162.1 lb

## 2021-02-07 DIAGNOSIS — K5904 Chronic idiopathic constipation: Secondary | ICD-10-CM | POA: Diagnosis not present

## 2021-02-07 NOTE — Patient Instructions (Signed)
Gave Linzess 146mg samples. Please let uKoreaknow if these help you and we can call you in a prescription.

## 2021-02-07 NOTE — Progress Notes (Signed)
Cephas Darby, MD [address]  [place]  [place], [place] [address]  Main: [phone]  Fax: [phone]    Gastroenterology Consultation  Referring Provider:     Lavera Guise, MD Primary Care Physician:  Lavera Guise, MD Primary Gastroenterologist:  Dr. Jonathon Bellows Reason for Consultation:     Chronic constipation, left-sided abdominal pain        HPI:   [name] is a 50 y.o. female referred by Dr. Humphrey Rolls, Timoteo Gaul, MD  for consultation & management of chronic constipation and left-sided abdominal pain.  Patient reports that she has been suffering from severe constipation for several years.  She reports that she does not have a bowel movement for several weeks, sometimes up to a month.  Constipation is associated with significant abdominal bloating, left lower quadrant pain as well as left upper quadrant pain, nausea.  She denies any rectal bleeding.  She has tried magnesium citrate, MiraLAX prep in the past.  Patient went to ER 2 weeks ago due to severe constipation, then went to [facility].  She was given GoLytely for cleanout.  She also had cross-sectional imaging of the CT scan with no acute intra-abdominal pathology identified.  Labs were unremarkable including CBC, CMP, serum lipase levels.  Patient does acknowledge consumption of carbonated beverages daily and red meat few times a week.  She does smoke cigarettes.  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  TSH was normal in 2021 Patient denies any complications during pregnancy and delivery, she has 2 children  Follow-up visit 02/07/2021 Patient is here for follow-up of constipation.  She tried Linzess 290 MCG samples and reports having bowel movements daily.  She reports 4 liquid bowel movements in the morning.  Continues to feel bloated.  Reports feeling much better.  She has cut back on sodas, currently consuming 1 can a day, trying to incorporate more fiber, also cut back on red meat.  Father  with colon cancer at age 2  NSAIDs: None  Antiplts/Anticoagulants/Anti thrombotics: None  GI Procedures:  colonoscopy 06/01/2019 - One 5 mm polyp in the descending colon, removed with a cold snare. Complete resection. Polyp tissue not retrieved. - One 10 mm polyp in the proximal ascending colon, removed with a cold snare. Resected and retrieved. Clip was placed. - One 5 mm polyp in the cecum, removed with a cold snare. Resected and retrieved. - One 3 mm polyp in the sigmoid colon, removed with a cold biopsy forceps. Resected and retrieved. - The examination was otherwise normal on direct and retroflexion views.  DIAGNOSIS:  A. COLON POLYP, SIGMOID; BIOPSY:  - POLYPOID BENIGN COLONIC MUCOSA WITH FOCAL REACTIVE LYMPHOPLASMACYTIC  AGGREGATE.  - NEGATIVE FOR DYSPLASIA AND MALIGNANCY.   B. COLON POLYP, ASCENDING; BIOPSY:  - FRAGMENTS OF SESSILE SERRATED POLYP.  - NEGATIVE FOR DYSPLASIA AND MALIGNANCY  - BENIGN SUBMUCOSAL ADIPOSE TISSUE PRESENT IN A FEW BIOPSY FRAGMENTS.   C. COLON POLYP, CECUM; BIOPSY:  - POLYPOID BENIGN COLONIC MUCOSA.  - NEGATIVE FOR DYSPLASIA AND MALIGNANCY.   Past Medical History:  Diagnosis Date   Back pain    Cancer ([place])    + CANCER CELLS TO UTERUS   COPD (chronic obstructive pulmonary disease) ([place])    Diabetes mellitus without complication ([place])    Family history of adverse reaction to anesthesia    FATHER STOPS BREATHING WITH ANESTHESIA   Headache    HX MIGRAINES   Seizures ([place])  1 SEIZURE  VERY STRESSED    Past Surgical History:  Procedure Laterality Date   ABDOMINAL HYSTERECTOMY     APPENDECTOMY     BACK SURGERY     COLONOSCOPY WITH PROPOFOL N/A 06/01/2019   Procedure: COLONOSCOPY WITH PROPOFOL;  Surgeon: Jonathon Bellows, MD;  Location: [facility] ENDOSCOPY;  Service: Gastroenterology;  Laterality: N/A;   LEFT HEART CATH AND CORONARY ANGIOGRAPHY Left 03/24/2020   Procedure: LEFT HEART CATH AND CORONARY ANGIOGRAPHY;  Surgeon: Corey Skains, MD;   Location: [place] [facility] LAB;  Service: Cardiovascular;  Laterality: Left;   Current Outpatient Medications:    Accu-Chek Softclix Lancets lancets, Use as instructed once a daily DX E11.65, Disp: 100 each, Rfl: 1   aspirin EC 81 MG tablet, Take 81 mg by mouth daily. Swallow whole., Disp: , Rfl:    atorvastatin (LIPITOR) 10 MG tablet, Take 1 tablet (10 mg total) by mouth daily., Disp: 90 tablet, Rfl: 3   Blood Glucose Monitoring Suppl (ACCU-CHEK GUIDE ME) w/Device KIT, Use as directed DX E11.65, Disp: 1 kit, Rfl: 0   glucose blood (ACCU-CHEK GUIDE) test strip, Use as directed once a daily DX E11.65, Disp: 100 each, Rfl: 1   ipratropium-albuterol (DUONEB) 0.5-2.5 (3) MG/3ML SOLN, Take 3 mLs by nebulization every 6 (six) hours as needed. (Patient taking differently: Take 3 mLs by nebulization 2 (two) times daily.), Disp: 360 mL, Rfl: 1   mometasone-formoterol (DULERA) 200-5 MCG/ACT AERO, Inhale 2 puffs into the lungs 2 (two) times daily., Disp: 1 each, Rfl: 3   sucralfate (CARAFATE) 1 g tablet, Take 1 tablet (1 g total) by mouth 4 (four) times daily as needed (for abdominal discomfort, nausea, and/or vomiting)., Disp: 30 tablet, Rfl: 1    Family History  Problem Relation Age of Onset   Breast cancer Maternal Grandmother 42     Social History   Tobacco Use   Smoking status: Light Smoker    Packs/day: 0.50    Years: 28.00    Pack years: 14.00    Types: Cigarettes   Smokeless tobacco: Never   Tobacco comments:    smokes a few a day maybe  Vaping Use   Vaping Use: Never used  Substance Use Topics   Alcohol use: No    Alcohol/week: 0.0 standard drinks   Drug use: No    Allergies as of 02/07/2021 - Review Complete 01/09/2021  Allergen Reaction Noted   Chlorhexidine Itching 08/24/2015   Clindamycin/lincomycin Rash 03/24/2020   Demerol [meperidine] Rash 05/26/2015   Elemental sulfur Rash 11/26/2018   Morphine and related Hives and Rash 05/26/2015    Review of Systems:    All  systems reviewed and negative except where noted in HPI.   Physical Exam:  BP 114/80 (BP Location: Left Arm, Patient Position: Sitting, Cuff Size: Normal)   Pulse 82   Temp 98 F (36.7 C) (Oral)   Ht '5\' 4"'  (1.626 m)   Wt 162 lb 2 oz (73.5 kg)   BMI 27.83 kg/m  No LMP recorded. Patient has had a hysterectomy.  General:   Alert,  Well-developed, well-nourished, pleasant and cooperative in NAD Head:  Normocephalic and atraumatic. Eyes:  Sclera clear, no icterus.   Conjunctiva pink. Ears:  Normal auditory acuity. Nose:  No deformity, discharge, or lesions. Mouth:  No deformity or lesions,oropharynx pink & moist. Neck:  Supple; no masses or thyromegaly. Lungs:  Respirations even and unlabored.  Clear throughout to auscultation.   No wheezes, crackles, or rhonchi. No acute distress.  Heart:  Regular rate and rhythm; no murmurs, clicks, rubs, or gallops. Abdomen:  Normal bowel sounds. Soft, mildly distended, nontender, without masses, hepatosplenomegaly or hernias noted.  No guarding or rebound tenderness.   Rectal: Not performed Msk:  Symmetrical without gross deformities. Good, equal movement & strength bilaterally. Pulses:  Normal pulses noted. Extremities:  No clubbing or edema.  No cyanosis. Neurologic:  Alert and oriented x3;  grossly normal neurologically. Skin:  Intact without significant lesions or rashes. No jaundice. Psych:  Alert and cooperative. Normal mood and affect.  Imaging Studies: Reviewed  Assessment and Plan:   [name] is a 50 y.o. female with history of hyperlipidemia, chronic tobacco use, diabetes, COPD is seen in consultation for severe chronic constipation  Chronic idiopathic constipation Continue high-fiber diet Adequate intake of water Since patient is having 4 liquid bowel movements on 290 MCG, will try 145 MCG samples.  Patient will let me know what dose on Linzess that she prefers after trying 145 MCG  Patient had a colonoscopy with  adequate prep in 05/2019 Solitary subcentimeter sessile serrated polyp on pathology Recommend surveillance colonoscopy in 05/2024 and every 5 years minimum given family history of colon cancer in patient's father at age 74   Follow up in 3 to 63 months   Cephas Darby, MD

## 2021-02-13 ENCOUNTER — Other Ambulatory Visit: Payer: Self-pay

## 2021-02-13 ENCOUNTER — Ambulatory Visit (INDEPENDENT_AMBULATORY_CARE_PROVIDER_SITE_OTHER): Payer: BLUE CROSS/BLUE SHIELD | Admitting: Internal Medicine

## 2021-02-13 ENCOUNTER — Encounter: Payer: Self-pay | Admitting: Internal Medicine

## 2021-02-13 VITALS — BP 120/84 | HR 90 | Temp 97.5°F | Resp 16 | Ht 64.0 in | Wt 165.8 lb

## 2021-02-13 DIAGNOSIS — J449 Chronic obstructive pulmonary disease, unspecified: Secondary | ICD-10-CM

## 2021-02-13 DIAGNOSIS — G4734 Idiopathic sleep related nonobstructive alveolar hypoventilation: Secondary | ICD-10-CM

## 2021-02-13 DIAGNOSIS — R911 Solitary pulmonary nodule: Secondary | ICD-10-CM | POA: Diagnosis not present

## 2021-02-13 MED ORDER — STIOLTO RESPIMAT 2.5-2.5 MCG/ACT IN AERS: 2.0000 | INHALATION_SPRAY | Freq: Every day | RESPIRATORY_TRACT | 4 refills | Status: DC

## 2021-02-13 NOTE — Patient Instructions (Signed)

## 2021-02-13 NOTE — Progress Notes (Signed)
[facility] [place], [place] [address]  Pulmonary Sleep Medicine   Office Visit Note  Patient Name: [name] DOB: 08-19-1970 MRN [id number]  Date of Service: 02/13/2021  Complaints/HPI: COPD and smoker. States that she has had 8 cigs in the past month. Had a CT scan done at [facility] and this shows stable nodules and also has some CAD. She states that she had a heart cath done recently at [facility].  As far as her breathing is concerned she does have COPD she has an occasional cough and shortness of breath noted.  Unfortunately continues to smoke.  I had a conversation with her regarding ongoing cigarette use and the importance of her quit smoking.  She had pulmonary functions done which showed that her FEV1 was 75% predicted so this is consistent with mild COPD.  ROS  General: (-) fever, (-) chills, (-) night sweats, (-) weakness Skin: (-) rashes, (-) itching,. Eyes: (-) visual changes, (-) redness, (-) itching. Nose and Sinuses: (-) nasal stuffiness or itchiness, (-) postnasal drip, (-) nosebleeds, (-) sinus trouble. Mouth and Throat: (-) sore throat, (-) hoarseness. Neck: (-) swollen glands, (-) enlarged thyroid, (-) neck pain. Respiratory: + cough, (-) bloody sputum, + shortness of breath, - wheezing. Cardiovascular: - ankle swelling, (-) chest pain. Lymphatic: (-) lymph node enlargement. Neurologic: (-) numbness, (-) tingling. Psychiatric: (-) anxiety, (-) depression   Current Medication: Outpatient Encounter Medications as of 02/13/2021  Medication Sig   Accu-Chek Softclix Lancets lancets Use as instructed once a daily DX E11.65   aspirin EC 81 MG tablet Take 81 mg by mouth daily. Swallow whole.   atorvastatin (LIPITOR) 10 MG tablet Take 1 tablet (10 mg total) by mouth daily.   Blood Glucose Monitoring Suppl (ACCU-CHEK GUIDE ME) w/Device KIT Use as directed DX E11.65   glucose blood (ACCU-CHEK GUIDE) test strip Use as directed once a daily DX E11.65    ipratropium-albuterol (DUONEB) 0.5-2.5 (3) MG/3ML SOLN Take 3 mLs by nebulization every 6 (six) hours as needed. (Patient taking differently: Take 3 mLs by nebulization 2 (two) times daily.)   mometasone-formoterol (DULERA) 200-5 MCG/ACT AERO Inhale 2 puffs into the lungs 2 (two) times daily.   sucralfate (CARAFATE) 1 g tablet Take 1 tablet (1 g total) by mouth 4 (four) times daily as needed (for abdominal discomfort, nausea, and/or vomiting).   No facility-administered encounter medications on file as of 02/13/2021.    Surgical History: Past Surgical History:  Procedure Laterality Date   ABDOMINAL HYSTERECTOMY     APPENDECTOMY     BACK SURGERY     COLONOSCOPY WITH PROPOFOL N/A 06/01/2019   Procedure: COLONOSCOPY WITH PROPOFOL;  Surgeon: Jonathon Bellows, MD;  Location: [facility] ENDOSCOPY;  Service: Gastroenterology;  Laterality: N/A;   LEFT HEART CATH AND CORONARY ANGIOGRAPHY Left 03/24/2020   Procedure: LEFT HEART CATH AND CORONARY ANGIOGRAPHY;  Surgeon: Corey Skains, MD;  Location: [facility] CV LAB;  Service: Cardiovascular;  Laterality: Left;    Medical History: Past Medical History:  Diagnosis Date   Back pain    Cancer ([place])    + CANCER CELLS TO UTERUS   COPD (chronic obstructive pulmonary disease) ([place])    Diabetes mellitus without complication (HCC)    Family history of adverse reaction to anesthesia    FATHER STOPS BREATHING WITH ANESTHESIA   Headache    HX MIGRAINES   Seizures ([place])    1 SEIZURE  VERY STRESSED    Family History: Family History  Problem Relation Age of Onset   Breast cancer Maternal Grandmother 49    Social History: Social History   Socioeconomic History   Marital status: Single    Spouse name: Not on file   Number of children: 2   Years of education: Not on file   Highest education level: Not on file  Occupational History   Not on file  Tobacco Use   Smoking status: Light Smoker    Packs/day: 0.50    Years: 28.00    Pack years: 14.00     Types: Cigarettes   Smokeless tobacco: Never   Tobacco comments:    smokes a few a day maybe  Vaping Use   Vaping Use: Never used  Substance and Sexual Activity   Alcohol use: No    Alcohol/week: 0.0 standard drinks   Drug use: No   Sexual activity: Not on file  Other Topics Concern   Not on file  Social History Narrative   Lives with boyfriend    Social Determinants of Health   Financial Resource Strain: Not on file  Food Insecurity: Not on file  Transportation Needs: Not on file  Physical Activity: Not on file  Stress: Not on file  Social Connections: Not on file  Intimate Partner Violence: Not on file    Vital Signs: Blood pressure 120/84, pulse 90, temperature (!) 97.5 F (36.4 C), resp. rate 16, height _0  (1.626 m), weight 165 lb 12.8 oz (75.2 kg), SpO2 98 %.  Examination: General Appearance: The patient is well-developed, well-nourished, and in no distress. Skin: Gross inspection of skin unremarkable. Head: normocephalic, no gross deformities. Eyes: no gross deformities noted. ENT: ears appear grossly normal no exudates. Neck: Supple. No thyromegaly. No LAD. Respiratory: few rhonchi noted at this time. Cardiovascular: Normal S1 and S2 without murmur or rub. Extremities: No cyanosis. pulses are equal. Neurologic: Alert and oriented. No involuntary movements.  LABS: Recent Results (from the past 2160 hour(s))  Lipase, blood     Status: None   Collection Time: 11/22/20  7:55 AM  Result Value Ref [place]   Lipase 39 11 - 51 U/L    Comment: Performed at [facility], [address] [place], [place] [address]  Comprehensive metabolic panel     Status: Abnormal   Collection Time: 11/22/20  7:55 AM  Result Value Ref Range   Sodium 135 135 - 145 mmol/L   Potassium 3.8 3.5 - 5.1 mmol/L   Chloride 106 98 - 111 mmol/L   CO2 20 (L) 22 - 32 mmol/L   Glucose, Bld 183 (H) 70 - 99 mg/dL    Comment: Glucose reference range applies only to samples taken  after fasting for at least 8 hours.   BUN 9 6 - 20 mg/dL   Creatinine, Ser 0.81 0.44 - 1.00 mg/dL   Calcium 9.0 8.9 - 10.3 mg/dL   Total Protein 6.6 6.5 - 8.1 g/dL   Albumin 3.6 3.5 - 5.0 g/dL   AST 19 15 - 41 U/L   ALT 9 0 - 44 U/L   Alkaline Phosphatase 67 38 - 126 U/L   Total Bilirubin 0.4 0.3 - 1.2 mg/dL   GFR, Estimated >60 >60 mL/min    Comment: (NOTE) Calculated using the CKD-EPI Creatinine Equation (2021)    Anion gap 9 5 - 15    Comment: Performed at [facility], [address] [place], [place] [address]  CBC     Status: None   Collection Time:  11/22/20  7:55 AM  Result Value Ref [place]   WBC 10.4 4.0 - 10.5 K/uL   RBC 3.93 3.87 - 5.11 MIL/uL   Hemoglobin 12.6 12.0 - 15.0 g/dL   HCT 36.8 36.0 - 46.0 %   MCV 93.6 80.0 - 100.0 fL   MCH 32.1 26.0 - 34.0 pg   MCHC 34.2 30.0 - 36.0 g/dL   RDW 12.4 11.5 - 15.5 %   Platelets 327 150 - 400 K/uL   nRBC 0.0 0.0 - 0.2 %    Comment: Performed at [facility], [address] [place], [place] [address]  Urinalysis, Complete w Microscopic Urine, Random     Status: Abnormal   Collection Time: 11/22/20  7:55 AM  Result Value Ref Range   Color, Urine YELLOW (A) YELLOW   APPearance HAZY (A) CLEAR   Specific Gravity, Urine 1.017 1.005 - 1.030   pH 5.0 5.0 - 8.0   Glucose, UA NEGATIVE NEGATIVE mg/dL   Hgb urine dipstick SMALL (A) NEGATIVE   Bilirubin Urine NEGATIVE NEGATIVE   Ketones, ur NEGATIVE NEGATIVE mg/dL   Protein, ur NEGATIVE NEGATIVE mg/dL   Nitrite NEGATIVE NEGATIVE   Leukocytes,Ua NEGATIVE NEGATIVE   RBC / HPF 0-5 0 - 5 RBC/hpf   WBC, UA 0-5 0 - 5 WBC/hpf   Bacteria, UA RARE (A) NONE SEEN   Squamous Epithelial / LPF 0-5 0 - 5   Mucus PRESENT     Comment: Performed at [facility], [address] [place], [place] [address]  POCT HgB A1C     Status: Abnormal   Collection Time: 11/23/20  3:37 PM  Result Value Ref Range   Hemoglobin A1C 6.9 (A) 4.0 - 5.6 %   HbA1c POC (<> result,  manual entry)     HbA1c, POC (prediabetic range)     HbA1c, POC (controlled diabetic range)    Lipase, blood     Status: None   Collection Time: 12/20/20  9:36 PM  Result Value Ref [place]   Lipase 38 11 - 51 U/L    Comment: Performed at [facility], [address] [place], [place] [address]  Comprehensive metabolic panel     Status: Abnormal   Collection Time: 12/20/20  9:36 PM  Result Value Ref [place]   Sodium 135 135 - 145 mmol/L   Potassium 3.4 (L) 3.5 - 5.1 mmol/L   Chloride 105 98 - 111 mmol/L   CO2 23 22 - 32 mmol/L   Glucose, Bld 171 (H) 70 - 99 mg/dL    Comment: Glucose reference range applies only to samples taken after fasting for at least 8 hours.   BUN 9 6 - 20 mg/dL   Creatinine, Ser 0.89 0.44 - 1.00 mg/dL   Calcium 8.9 8.9 - 10.3 mg/dL   Total Protein 7.0 6.5 - 8.1 g/dL   Albumin 3.9 3.5 - 5.0 g/dL   AST 17 15 - 41 U/L   ALT 12 0 - 44 U/L   Alkaline Phosphatase 73 38 - 126 U/L   Total Bilirubin 0.5 0.3 - 1.2 mg/dL   GFR, Estimated >60 >60 mL/min    Comment: (NOTE) Calculated using the CKD-EPI Creatinine Equation (2021)    Anion gap 7 5 - 15    Comment: Performed at [facility], [address], [place],  [address]  CBC     Status: Abnormal   Collection Time: 12/20/20  9:36 PM  Result Value Ref [place]   WBC 10.8 (  H) 4.0 - 10.5 K/uL   RBC 4.12 3.87 - 5.11 MIL/uL   Hemoglobin 13.3 12.0 - 15.0 g/dL   HCT 38.6 36.0 - 46.0 %   MCV 93.7 80.0 - 100.0 fL   MCH 32.3 26.0 - 34.0 pg   MCHC 34.5 30.0 - 36.0 g/dL   RDW 12.9 11.5 - 15.5 %   Platelets 281 150 - 400 K/uL   nRBC 0.0 0.0 - 0.2 %    Comment: Performed at [facility], [address] [place], [place] [address]  POCT UA - Microalbumin     Status: None   Collection Time: 01/01/21  3:55 PM  Result Value Ref Range   Microalbumin Ur, POC 30 mg/L   Creatinine, POC 300 mg/dL   Albumin/Creatinine Ratio, Urine, POC      Radiology: No results found.  No results found.  No  results found.    Assessment and Plan: Patient Active Problem List   Diagnosis Date Noted   Benign essential HTN 04/24/2020   Coronary artery disease involving native coronary artery of native heart 04/24/2020   Hyperlipidemia, mixed 04/24/2020   Angina pectoris ([place]) 02/29/2020   COPD (chronic obstructive pulmonary disease) with chronic bronchitis ([place]) 01/31/2020   Atherosclerosis of abdominal aorta ([place]) 09/10/2019   Grade I hemorrhoids 07/27/2019   Degenerative disc disease, lumbar 08/24/2015    1. Lung nodule She had a CT scan done which reveals stable nodules. Cardiac workup done in the past by Dr Nehemiah Massed and was unremarkable for CAD  2. Chronic obstructive pulmonary disease, unspecified COPD type ([place]) MILD disease. I have placed her on Stioloto samples given today along with instructions to stop the dulera. She does have a neb at home which she can use as needed smoking cessation is of the most importance as far as progression of disease  3. Nocturnal hypoxia She is on oxygen and this will be continued on the current oxygen settings.  4. CAD  Had calcifications also had cath and stress test at [facility] by Dr Nehemiah Massed I looked at the report she presented to me on mychart  General Counseling: I have discussed the findings of the evaluation and examination with [name].  I have also discussed any further diagnostic evaluation thatmay be needed or ordered today. [name] verbalizes understanding of the findings of todays visit. We also reviewed her medications today and discussed drug interactions and side effects including but not limited excessive drowsiness and altered mental states. We also discussed that there is always a risk not just to her but also people around her. she has been encouraged to call the office with any questions or concerns that should arise related to todays visit.  No orders of the defined types were placed in this encounter.    Time spent: 37  I have  personally obtained a history, examined the patient, evaluated laboratory and imaging results, formulated the assessment and plan and placed orders.    Allyne Gee, MD [facility] Pulmonary and Critical Care Sleep medicine

## 2021-02-13 NOTE — Procedures (Signed)
[facility] [address] [place], [address]    Complete Pulmonary Function Testing Interpretation:  FINDINGS:  Forced vital capacity is normal FEV1 is 2.03 L which is 75% predicted and is mildly decreased.  F1 FVC ratio is mildly decreased.  Postbronchodilator no significant change in FEV1 total lung capacity is normal residual volume is increased residual internal capacity ratio is increased FRC is normal.  DLCO was normal  IMPRESSION:  This pulmonary function study is suggestive of mild obstructive lung disease.  Allyne Gee, MD [facility] Pulmonary Critical Care Medicine Sleep Medicine

## 2021-02-14 ENCOUNTER — Ambulatory Visit
Admission: RE | Admit: 2021-02-14 | Discharge: 2021-02-14 | Disposition: A | Discharge status: Home / Self Care | Payer: BLUE CROSS/BLUE SHIELD | Source: Ambulatory Visit | Attending: Physician Assistant | Admitting: Physician Assistant

## 2021-02-14 DIAGNOSIS — Z1231 Encounter for screening mammogram for malignant neoplasm of breast: Secondary | ICD-10-CM | POA: Diagnosis present

## 2021-02-27 IMAGING — MG DIGITAL DIAGNOSTIC BILAT W/ TOMO W/ CAD
6 of 10 series · 6 of 30 positions shown · non-contrast
Comparison: Previous exam(s).

CLINICAL DATA: Diffuse left breast pain.

EXAM:
DIGITAL DIAGNOSTIC BILATERAL MAMMOGRAM WITH TOMO AND CAD

[R CC synth-2D]
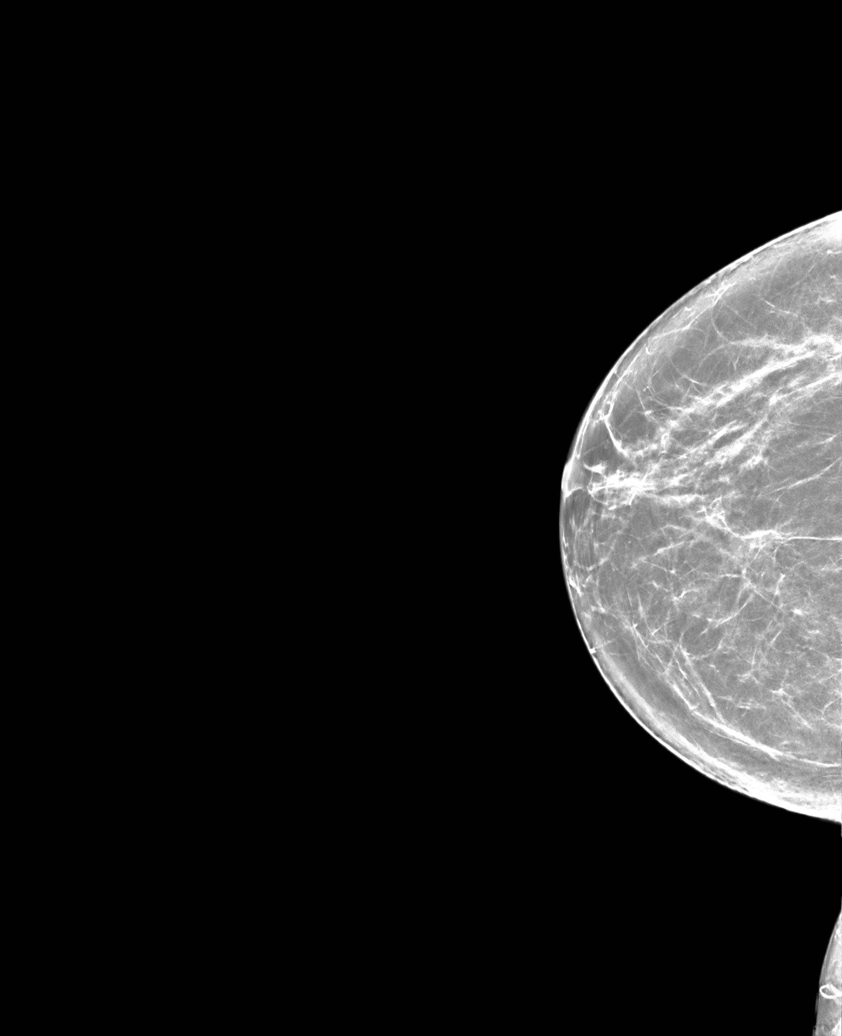

[L CC synth-2D (1 of 2)]
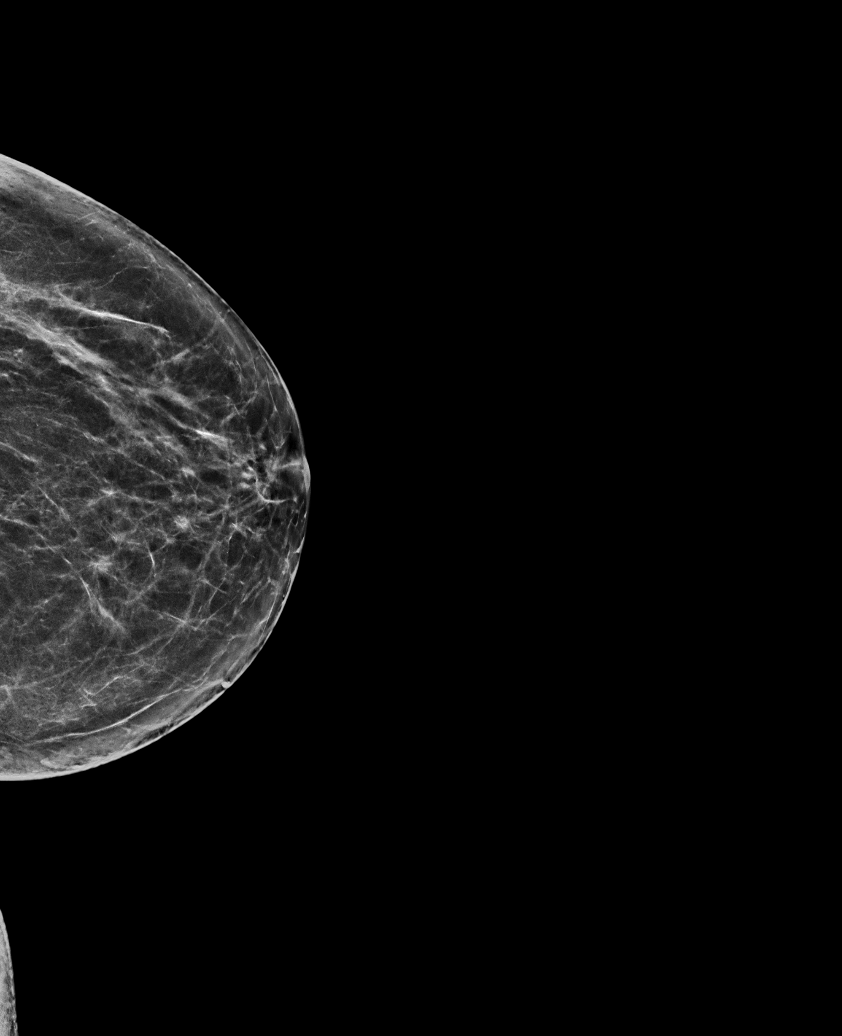

[L CC synth-2D (2 of 2)]
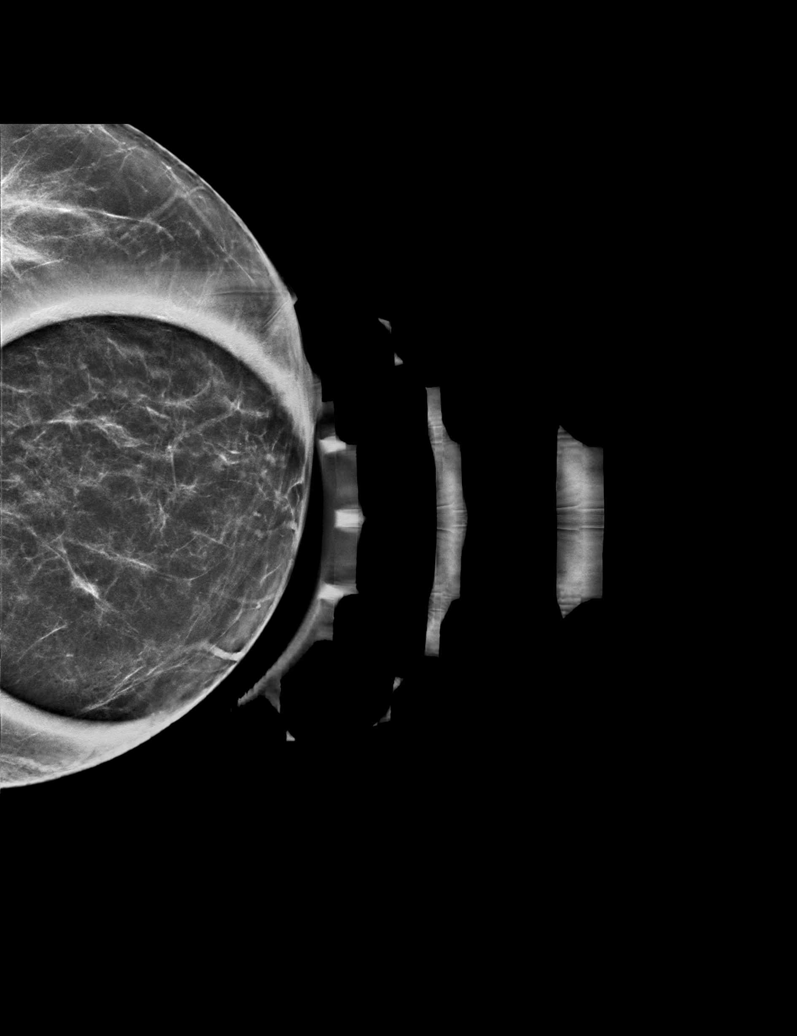

[R MLO synth-2D]
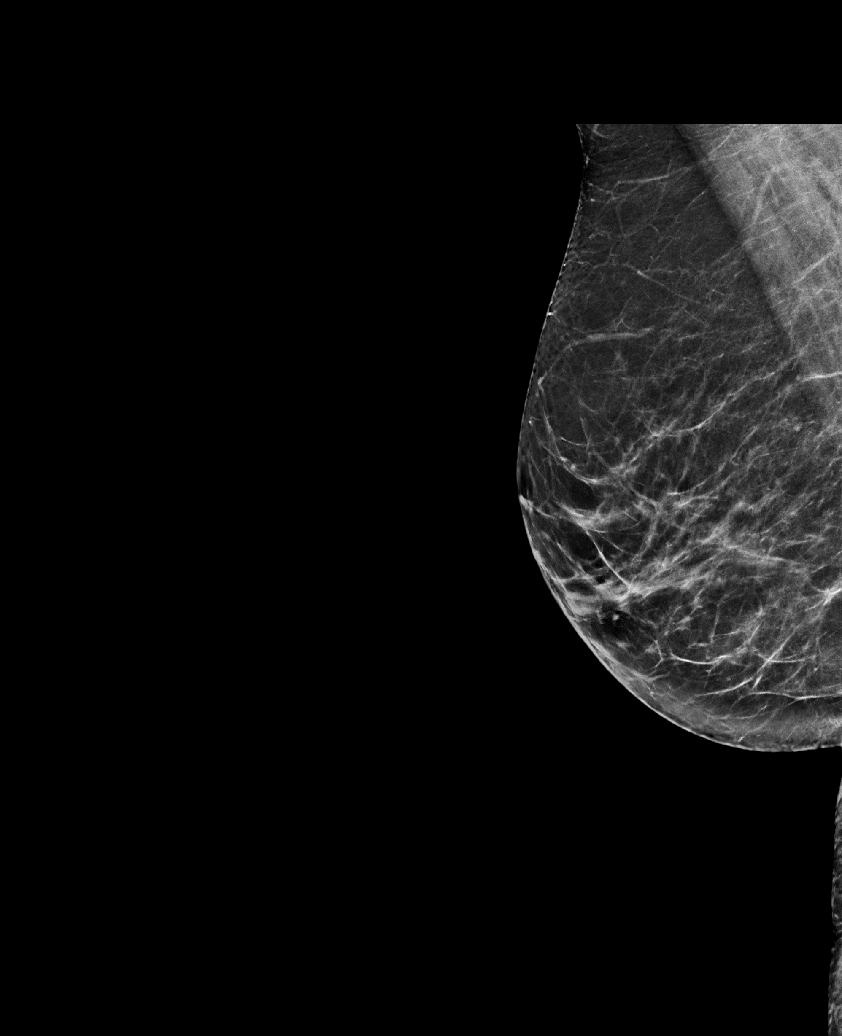

[L MLO synth-2D]
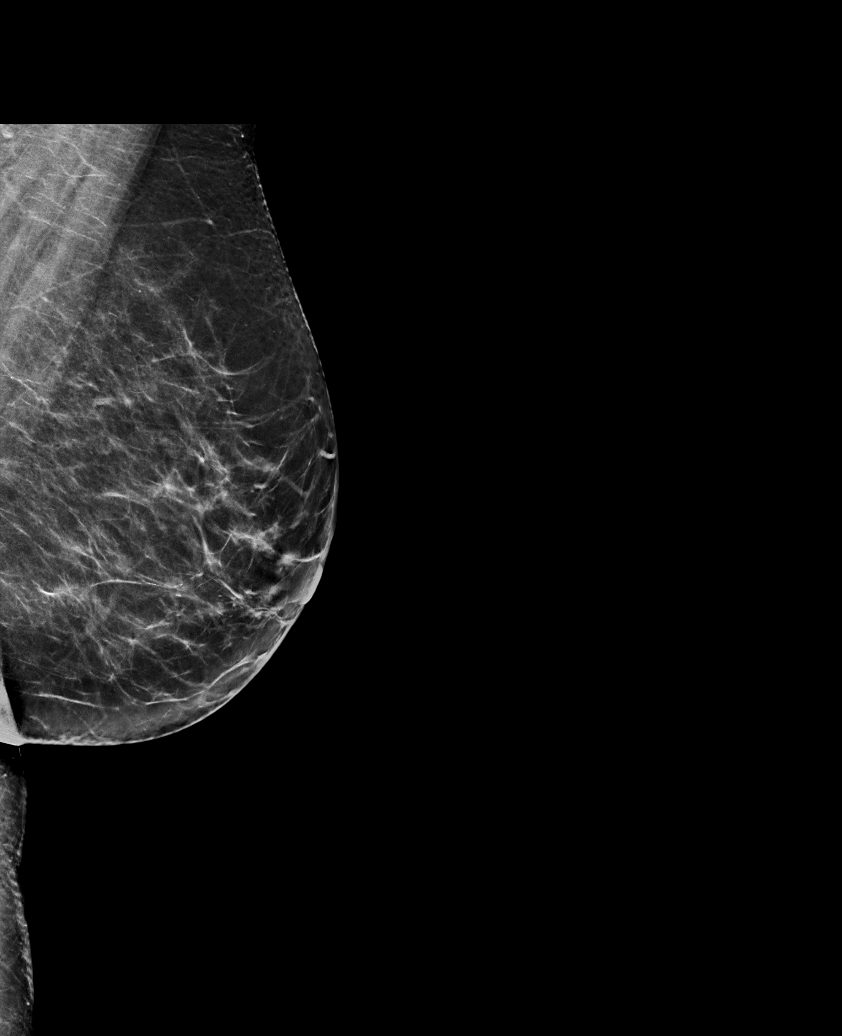

[L MLO tomo · tomo slice 37/74.0]
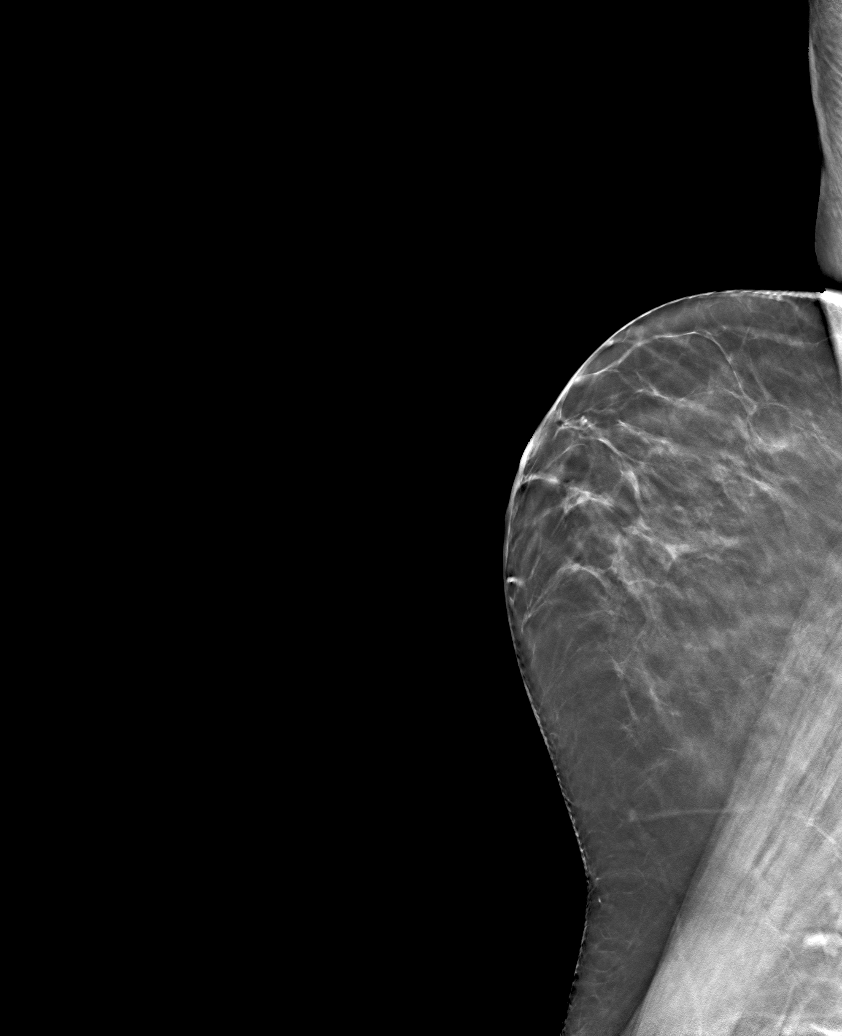

[6 of 30 positions shown; findings below may reference images not displayed]

ACR Breast Density Category b: There are scattered areas of
fibroglandular density.
FINDINGS: Asymmetries in the left medial breast resolve on additional imaging.
No evidence of malignancy in either breast.

Mammographic images were processed with CAD.
IMPRESSION: No mammographic evidence of malignancy.

RECOMMENDATION:
Treatment of the patient's diffuse left breast pain should be based
on clinical and physical exam given lack of imaging findings.
Recommend annual screening mammography.

I have discussed the findings and recommendations with the patient.
If applicable, a reminder letter will be sent to the patient
regarding the next appointment.

BI-RADS CATEGORY  1: Negative.

## 2021-03-29 ENCOUNTER — Encounter: Payer: Self-pay | Admitting: General Surgery

## 2021-04-05 ENCOUNTER — Other Ambulatory Visit: Payer: Self-pay

## 2021-04-05 ENCOUNTER — Ambulatory Visit (INDEPENDENT_AMBULATORY_CARE_PROVIDER_SITE_OTHER): Payer: BLUE CROSS/BLUE SHIELD | Admitting: Physician Assistant

## 2021-04-05 ENCOUNTER — Encounter: Payer: Self-pay | Admitting: Physician Assistant

## 2021-04-05 VITALS — BP 110/84 | HR 94 | Temp 98.3°F | Resp 16 | Ht 64.0 in | Wt 165.2 lb

## 2021-04-05 DIAGNOSIS — J449 Chronic obstructive pulmonary disease, unspecified: Secondary | ICD-10-CM | POA: Diagnosis not present

## 2021-04-05 DIAGNOSIS — E1165 Type 2 diabetes mellitus with hyperglycemia: Secondary | ICD-10-CM | POA: Diagnosis not present

## 2021-04-05 DIAGNOSIS — F17209 Nicotine dependence, unspecified, with unspecified nicotine-induced disorders: Secondary | ICD-10-CM | POA: Diagnosis not present

## 2021-04-05 DIAGNOSIS — K5901 Slow transit constipation: Secondary | ICD-10-CM

## 2021-04-05 LAB — POCT GLYCOSYLATED HEMOGLOBIN (HGB A1C): Hemoglobin A1C: 7.2 % — AB (ref 4.0–5.6)

## 2021-04-05 NOTE — Progress Notes (Signed)
[facility] [place], [place] [address]  Internal MEDICINE  Office Visit Note  Patient Name: [name]  [id number]  [id number]  Date of Service: 04/09/2021  Chief Complaint  Patient presents with   Follow-up    3 month   Results    Pt asking for mammo, labs results, PFT, CT scan    HPI Pt is here for routine follow up to review lab results -Still vaping and smoking unfortunately. Did already have pulmonary visit where PFT and Ct scan reviewed. -She is using stiolto -Regular BM now and is followed by GI, takes linzess 163mg. -states she is taking 509mMetformin once per day? However this is not on her med list and last prescribed was farxiga. Will need to clarify medication. BG in Am 145 -working on diet -mammogram was negative and 1 year screening recommended  Current Medication: Outpatient Encounter Medications as of 04/05/2021  Medication Sig   Accu-Chek Softclix Lancets lancets Use as instructed once a daily DX E11.65   aspirin EC 81 MG tablet Take 81 mg by mouth daily. Swallow whole.   atorvastatin (LIPITOR) 10 MG tablet Take 1 tablet (10 mg total) by mouth daily.   Blood Glucose Monitoring Suppl (ACCU-CHEK GUIDE ME) w/Device KIT Use as directed DX E11.65   glucose blood (ACCU-CHEK GUIDE) test strip Use as directed once a daily DX E11.65   ipratropium-albuterol (DUONEB) 0.5-2.5 (3) MG/3ML SOLN Take 3 mLs by nebulization every 6 (six) hours as needed. (Patient taking differently: Take 3 mLs by nebulization 2 (two) times daily.)   mometasone-formoterol (DULERA) 200-5 MCG/ACT AERO Inhale 2 puffs into the lungs 2 (two) times daily.   sucralfate (CARAFATE) 1 g tablet Take 1 tablet (1 g total) by mouth 4 (four) times daily as needed (for abdominal discomfort, nausea, and/or vomiting).   Tiotropium Bromide-Olodaterol (STIOLTO RESPIMAT) 2.5-2.5 MCG/ACT AERS Inhale 2 puffs into the lungs daily.   No facility-administered encounter medications on  file as of 04/05/2021.    Surgical History: Past Surgical History:  Procedure Laterality Date   ABDOMINAL HYSTERECTOMY     APPENDECTOMY     BACK SURGERY     COLONOSCOPY WITH PROPOFOL N/A 06/01/2019   Procedure: COLONOSCOPY WITH PROPOFOL;  Surgeon: AnJonathon BellowsMD;  [facility];  Service: Gastroenterology;  Laterality: N/A;   LEFT HEART CATH AND CORONARY ANGIOGRAPHY Left 03/24/2020   Procedure: LEFT HEART CATH AND CORONARY ANGIOGRAPHY;  Surgeon: KoCorey SkainsMD;  [facility];  Service: Cardiovascular;  Laterality: Left;    Medical History: Past Medical History:  Diagnosis Date   Back pain    [place] ([place]   + CANCER CELLS TO UTERUS   COPD (chronic obstructive pulmonary disease) ([place]   Diabetes mellitus without complication (HCC)    Family history of adverse reaction to anesthesia    FATHER STOPS BREATHING WITH ANESTHESIA   Headache    HX MIGRAINES   [place] ([place]   1 SEIZURE  VERY STRESSED    Family History: Family History  Problem Relation Age of Onset   Breast cancer Maternal Grandmother [id number]  Social History   Socioeconomic History   Marital status: Single    Spouse name: Not on file   Number of children: 2   Years of education: Not on file   Highest education level: Not on file  Occupational History   Not on file  Tobacco Use   Smoking status: Light Smoker  Packs/day: 0.50    Years: 28.00    Pack years: 14.00    Types: Cigarettes   Smokeless tobacco: Never   Tobacco comments:    smokes a few a day maybe  Vaping Use   Vaping Use: Never used  Substance and Sexual Activity   Alcohol use: No    Alcohol/week: 0.0 standard drinks   Drug use: No   Sexual activity: Not on file  Other Topics Concern   Not on file  Social History Narrative   Lives with boyfriend    Social Determinants of Health   Financial Resource Strain: Not on file  Food Insecurity: Not on file  Transportation Needs: Not on file  Physical  Activity: Not on file  Stress: Not on file  Social Connections: Not on file  Intimate Partner Violence: Not on file      Review of Systems  Constitutional:  Negative for chills, fatigue and unexpected weight change.  HENT:  Negative for congestion, postnasal drip, rhinorrhea, sneezing and sore throat.   Eyes:  Negative for redness.  Respiratory:  Negative for cough, chest tightness and shortness of breath.   Cardiovascular:  Negative for chest pain and palpitations.  Gastrointestinal:  Positive for constipation. Negative for abdominal pain, diarrhea, nausea and vomiting.  Genitourinary:  Negative for dysuria and frequency.  Musculoskeletal:  Negative for arthralgias, back pain, joint swelling and neck pain.  Skin:  Negative for rash.  Neurological: Negative.  Negative for tremors and numbness.  Hematological:  Negative for adenopathy. Does not bruise/bleed easily.  Psychiatric/Behavioral:  Negative for behavioral problems (Depression), sleep disturbance and suicidal ideas. The patient is not nervous/anxious.    Vital Signs: BP 110/84   Pulse 94   Temp 98.3 F (36.8 C)   Resp 16   Ht '5\' 4"'  (1.626 m)   Wt 165 lb 3.2 oz (74.9 kg)   SpO2 96%   BMI 28.36 kg/m    Physical Exam Vitals and nursing note reviewed.  Constitutional:      General: She is not in acute distress.    Appearance: She is well-developed. She is not diaphoretic.  HENT:     Head: Normocephalic and atraumatic.     Mouth/Throat:     Pharynx: No oropharyngeal exudate.  Eyes:     Pupils: Pupils are equal, round, and reactive to light.  Neck:     Thyroid: No thyromegaly.     Vascular: No JVD.     Trachea: No tracheal deviation.  Cardiovascular:     Rate and Rhythm: Normal rate and regular rhythm.     Heart sounds: Normal heart sounds. No murmur heard.   No friction rub. No gallop.  Pulmonary:     Effort: Pulmonary effort is normal. No respiratory distress.     Breath sounds: No wheezing or rales.   Chest:     Chest wall: No tenderness.  Abdominal:     General: Bowel sounds are normal.     Palpations: Abdomen is soft.  Musculoskeletal:        General: Normal range of motion.     Cervical back: Normal range of motion and neck supple.  Lymphadenopathy:     Cervical: No cervical adenopathy.  Skin:    General: Skin is warm and dry.  Neurological:     Mental Status: She is alert and oriented to person, place, and time.     Cranial Nerves: No cranial nerve deficit.  Psychiatric:        Behavior:  Behavior normal.        Thought Content: Thought content normal.        Judgment: Judgment normal.       Assessment/Plan: 1. Uncontrolled type 2 diabetes mellitus with hyperglycemia (HCC) - POCT glycosylated hemoglobin (Hb A1C) is 7.2, will need to work on improving diet and exercise and may need to increase medication, however need to clarify what medications she is taking since she reports taking metformin not farxiga as was last seen in her chart.  2. Constipation by delayed colonic transit Followed by GI and doing better  3. Tobacco use disorder, continuous Continues to smoke/vape and was again counseled on cessation, but pt states she picked this up when she became sober and is hard for her to stop  4. Chronic obstructive pulmonary disease, unspecified COPD type ([place]) Followed by pulmonology   General Counseling: Michelle Nasuti understanding of the findings of todays visit and agrees with plan of treatment. I have discussed any further diagnostic evaluation that may be needed or ordered today. We also reviewed her medications today. she has been encouraged to call the office with any questions or concerns that should arise related to todays visit.    Orders Placed This Encounter  Procedures   POCT glycosylated hemoglobin (Hb A1C)    No orders of the defined types were placed in this encounter.   This patient was seen by Drema Dallas, PA-C in collaboration with  Dr. Clayborn Bigness as a part of collaborative care agreement.   Total time spent:30 Minutes Time spent includes review of chart, medications, test results, and follow up plan with the patient.      Dr Lavera Guise Internal medicine

## 2021-04-11 ENCOUNTER — Other Ambulatory Visit: Payer: Self-pay | Admitting: Physician Assistant

## 2021-04-11 DIAGNOSIS — E1165 Type 2 diabetes mellitus with hyperglycemia: Secondary | ICD-10-CM

## 2021-04-11 MED ORDER — METFORMIN HCL 500 MG PO TABS: 500.0000 mg | ORAL_TABLET | Freq: Every day | ORAL | 1 refills | Status: DC

## 2021-05-09 ENCOUNTER — Encounter: Payer: BLUE CROSS/BLUE SHIELD | Admitting: Nurse Practitioner

## 2021-05-28 ENCOUNTER — Encounter: Payer: BLUE CROSS/BLUE SHIELD | Admitting: Physician Assistant

## 2021-06-11 ENCOUNTER — Telehealth: Payer: Self-pay | Admitting: Gastroenterology

## 2021-06-11 ENCOUNTER — Ambulatory Visit: Payer: BLUE CROSS/BLUE SHIELD | Admitting: Gastroenterology

## 2021-06-11 NOTE — Telephone Encounter (Signed)
LVM for patient to call our office to reschedule appt. Provider will be our of the office.

## 2021-06-15 ENCOUNTER — Encounter: Payer: Self-pay | Admitting: Internal Medicine

## 2021-06-21 ENCOUNTER — Other Ambulatory Visit: Payer: Self-pay

## 2021-06-21 ENCOUNTER — Encounter: Payer: Self-pay | Admitting: *Deleted

## 2021-06-21 ENCOUNTER — Emergency Department: Payer: BLUE CROSS/BLUE SHIELD

## 2021-06-21 DIAGNOSIS — I119 Hypertensive heart disease without heart failure: Secondary | ICD-10-CM | POA: Insufficient documentation

## 2021-06-21 DIAGNOSIS — Z7982 Long term (current) use of aspirin: Secondary | ICD-10-CM | POA: Insufficient documentation

## 2021-06-21 DIAGNOSIS — R0602 Shortness of breath: Secondary | ICD-10-CM | POA: Insufficient documentation

## 2021-06-21 DIAGNOSIS — Z8541 Personal history of malignant neoplasm of cervix uteri: Secondary | ICD-10-CM | POA: Insufficient documentation

## 2021-06-21 DIAGNOSIS — E119 Type 2 diabetes mellitus without complications: Secondary | ICD-10-CM | POA: Insufficient documentation

## 2021-06-21 DIAGNOSIS — N9489 Other specified conditions associated with female genital organs and menstrual cycle: Secondary | ICD-10-CM | POA: Diagnosis not present

## 2021-06-21 DIAGNOSIS — Z20822 Contact with and (suspected) exposure to covid-19: Secondary | ICD-10-CM | POA: Diagnosis not present

## 2021-06-21 DIAGNOSIS — J449 Chronic obstructive pulmonary disease, unspecified: Secondary | ICD-10-CM | POA: Insufficient documentation

## 2021-06-21 DIAGNOSIS — I2510 Atherosclerotic heart disease of native coronary artery without angina pectoris: Secondary | ICD-10-CM | POA: Insufficient documentation

## 2021-06-21 DIAGNOSIS — F17210 Nicotine dependence, cigarettes, uncomplicated: Secondary | ICD-10-CM | POA: Insufficient documentation

## 2021-06-21 DIAGNOSIS — Z79899 Other long term (current) drug therapy: Secondary | ICD-10-CM | POA: Insufficient documentation

## 2021-06-21 DIAGNOSIS — Z7984 Long term (current) use of oral hypoglycemic drugs: Secondary | ICD-10-CM | POA: Insufficient documentation

## 2021-06-21 DIAGNOSIS — R0789 Other chest pain: Secondary | ICD-10-CM | POA: Insufficient documentation

## 2021-06-21 LAB — BASIC METABOLIC PANEL
Anion gap: 10 (ref 5–15)
BUN: 11 mg/dL (ref 6–20)
CO2: 22 mmol/L (ref 22–32)
Calcium: 9.5 mg/dL (ref 8.9–10.3)
Chloride: 101 mmol/L (ref 98–111)
Creatinine, Ser: 0.85 mg/dL (ref 0.44–1.00)
GFR, Estimated: >60 mL/min (ref >60)
Glucose, Bld: 162 mg/dL — ABNORMAL HIGH (ref 70–99)
Potassium: 4.3 mmol/L (ref 3.5–5.1)
Sodium: 133 mmol/L — ABNORMAL LOW (ref 135–145)

## 2021-06-21 LAB — CBC
HCT: 39.9 % (ref 36.0–46.0)
Hemoglobin: 13.4 g/dL (ref 12.0–15.0)
MCH: 32.2 pg (ref 26.0–34.0)
MCHC: 33.6 g/dL (ref 30.0–36.0)
MCV: 95.9 fL (ref 80.0–100.0)
Platelets: 311 10*3/uL (ref 150–400)
RBC: 4.16 MIL/uL (ref 3.87–5.11)
RDW: 12.7 % (ref 11.5–15.5)
WBC: 13 10*3/uL — ABNORMAL HIGH (ref 4.0–10.5)
nRBC: 0 % (ref 0.0–0.2)

## 2021-06-21 LAB — TROPONIN I (HIGH SENSITIVITY): Troponin I (High Sensitivity): 3 ng/L (ref <18)

## 2021-06-21 NOTE — ED Triage Notes (Signed)
Pt has chest pain for 3 days.  Pt states n/v today.  Pt has sob  cig smoker.  No cough.  Pt alert  speech clear.

## 2021-06-22 ENCOUNTER — Emergency Department
Admission: EM | Admit: 2021-06-22 | Discharge: 2021-06-22 | Disposition: A | Discharge status: Home / Self Care | Payer: BLUE CROSS/BLUE SHIELD | Attending: Emergency Medicine | Admitting: Emergency Medicine

## 2021-06-22 ENCOUNTER — Emergency Department: Payer: BLUE CROSS/BLUE SHIELD

## 2021-06-22 ENCOUNTER — Encounter: Payer: Self-pay | Admitting: Radiology

## 2021-06-22 DIAGNOSIS — R079 Chest pain, unspecified: Secondary | ICD-10-CM

## 2021-06-22 LAB — HEPATIC FUNCTION PANEL
ALT: 13 U/L (ref 0–44)
AST: 17 U/L (ref 15–41)
Albumin: 3.8 g/dL (ref 3.5–5.0)
Alkaline Phosphatase: 83 U/L (ref 38–126)
Bilirubin, Direct: <0.1 mg/dL (ref 0.0–0.2)
Total Bilirubin: 0.4 mg/dL (ref 0.3–1.2)
Total Protein: 7.2 g/dL (ref 6.5–8.1)

## 2021-06-22 LAB — LIPASE, BLOOD: Lipase: 50 U/L (ref 11–51)

## 2021-06-22 LAB — RESP PANEL BY RT-PCR (FLU A&B, COVID) ARPGX2
Influenza A by PCR: NEGATIVE
Influenza B by PCR: NEGATIVE
SARS Coronavirus 2 by RT PCR: NEGATIVE

## 2021-06-22 LAB — D-DIMER, QUANTITATIVE: D-Dimer, Quant: 6.78 ug/mL-FEU — ABNORMAL HIGH (ref 0.00–0.50)

## 2021-06-22 LAB — TROPONIN I (HIGH SENSITIVITY): Troponin I (High Sensitivity): 4 ng/L (ref <18)

## 2021-06-22 LAB — HCG, QUANTITATIVE, PREGNANCY: hCG, Beta Chain, Quant, S: 3 m[IU]/mL (ref <5)

## 2021-06-22 MED ORDER — TECHNETIUM TO 99M ALBUMIN AGGREGATED
4.0000 | Freq: Once | INTRAVENOUS | Status: AC
  Administered 2021-06-22: 11:00:00 4.5 via INTRAVENOUS
  Filled 2021-06-22: qty 4

## 2021-06-22 NOTE — ED Provider Notes (Signed)
[facility] Emergency Department Provider Note  Time seen: 7:44 AM  I have reviewed the triage vital signs and the nursing notes.   HISTORY  Chief Complaint Chest Pain   HPI [name] is a 50 y.o. female with a past medical history of COPD, diabetes, presents to the emergency department for 3 days of chest pain.  According to the patient over the past 3 days she has had intermittent and more constant left-sided chest pain.  Denies any cough but does state mild shortness of breath at times.  Denies any pleuritic pain.  No leg pain or swelling.  No history of DVT or PE.  No cardiac history.  Past Medical History:  Diagnosis Date   Back pain    Cancer ([place])    + CANCER CELLS TO UTERUS   COPD (chronic obstructive pulmonary disease) ([place])    Diabetes mellitus without complication ([place])    Family history of adverse reaction to anesthesia    FATHER STOPS BREATHING WITH ANESTHESIA   Headache    HX MIGRAINES   Seizures ([place])    1 SEIZURE  VERY STRESSED    Patient Active Problem List   Diagnosis Date Noted   Benign essential HTN 04/24/2020   Coronary artery disease involving native coronary artery of native heart 04/24/2020   Hyperlipidemia, mixed 04/24/2020   Angina pectoris ([place]) 02/29/2020   COPD (chronic obstructive pulmonary disease) with chronic bronchitis ([place]) 01/31/2020   Atherosclerosis of abdominal aorta ([place]) 09/10/2019   Grade I hemorrhoids 07/27/2019   Degenerative disc disease, lumbar 08/24/2015    Past Surgical History:  Procedure Laterality Date   ABDOMINAL HYSTERECTOMY     APPENDECTOMY     BACK SURGERY     COLONOSCOPY WITH PROPOFOL N/A 06/01/2019   Procedure: COLONOSCOPY WITH PROPOFOL;  Surgeon: Jonathon Bellows, MD;  Location: [facility] ENDOSCOPY;  Service: Gastroenterology;  Laterality: N/A;   LEFT HEART CATH AND CORONARY ANGIOGRAPHY Left 03/24/2020   Procedure: LEFT HEART CATH AND CORONARY ANGIOGRAPHY;  Surgeon: Corey Skains, MD;   Location: [place] CV LAB;  Service: Cardiovascular;  Laterality: Left;    Prior to Admission medications   Medication Sig Start Date End Date Taking? Authorizing Provider  Accu-Chek Softclix Lancets lancets Use as instructed once a daily DX E11.65 07/27/20   Lavera Guise, MD  aspirin EC 81 MG tablet Take 81 mg by mouth daily. Swallow whole.    [provider]  atorvastatin (LIPITOR) 10 MG tablet Take 1 tablet (10 mg total) by mouth daily. 02/17/20   Luiz Ochoa, NP  Blood Glucose Monitoring Suppl (ACCU-CHEK GUIDE ME) w/Device KIT Use as directed DX E11.65 07/27/20   Lavera Guise, MD  glucose blood (ACCU-CHEK GUIDE) test strip Use as directed once a daily DX E11.65 07/27/20   Lavera Guise, MD  ipratropium-albuterol (DUONEB) 0.5-2.5 (3) MG/3ML SOLN Take 3 mLs by nebulization every 6 (six) hours as needed. Patient taking differently: Take 3 mLs by nebulization 2 (two) times daily. 03/09/20   Luiz Ochoa, NP  metFORMIN (GLUCOPHAGE) 500 MG tablet Take 1 tablet (500 mg total) by mouth daily with breakfast. 04/11/21   McDonough, Si Gaul, PA-C  mometasone-formoterol (DULERA) 200-5 MCG/ACT AERO Inhale 2 puffs into the lungs 2 (two) times daily. 02/21/20   Lavera Guise, MD  sucralfate (CARAFATE) 1 g tablet Take 1 tablet (1 g total) by mouth 4 (four) times daily as needed (for abdominal discomfort, nausea, and/or vomiting). 12/21/20  Hinda Kehr, MD  Tiotropium Bromide-Olodaterol (STIOLTO RESPIMAT) 2.5-2.5 MCG/ACT AERS Inhale 2 puffs into the lungs daily. 02/13/21   Allyne Gee, MD    Allergies  Allergen Reactions   Chlorhexidine Itching   Ivp Dye [Iodinated Contrast Media]    Clindamycin/Lincomycin Rash   Demerol [Meperidine] Rash   Elemental Sulfur Rash   Morphine And Related Hives and Rash    Family History  Problem Relation Age of Onset   Breast cancer Maternal Grandmother 78    Social History Social History   Tobacco Use   Smoking status: Light Smoker     Packs/day: 0.50    Years: 28.00    Pack years: 14.00    Types: Cigarettes   Smokeless tobacco: Never   Tobacco comments:    smokes a few a day maybe  Vaping Use   Vaping Use: Never used  Substance Use Topics   Alcohol use: No    Alcohol/week: 0.0 standard drinks   Drug use: No    Review of Systems Constitutional: Negative for fever Cardiovascular: Intermittent chest pain x3 days. Respiratory: Positive for shortness of breath.  Negative for cough. Gastrointestinal: Negative for abdominal pain Musculoskeletal: Negative for musculoskeletal complaints Neurological: Negative for headache All other ROS negative  ____________________________________________   PHYSICAL EXAM:  VITAL SIGNS: ED Triage Vitals  Enc Vitals Group     BP 06/21/21 1945 (!) 137/94     Pulse Rate 06/21/21 1945 87     Resp 06/21/21 1945 20     Temp 06/21/21 1945 98.2 F (36.8 C)     Temp Source 06/21/21 1945 Oral     SpO2 06/21/21 1945 100 %     Weight 06/21/21 1942 165 lb (74.8 kg)     Height 06/21/21 1942 '5\' 4"'  (1.626 m)     Head Circumference --      Peak Flow --      Pain Score 06/21/21 1942 8     Pain Loc --      Pain Edu? --      Excl. in [place]? --    Constitutional: Alert and oriented. Well appearing and in no distress. Eyes: Normal exam ENT      Head: Normocephalic and atraumatic.      Mouth/Throat: Mucous membranes are moist. Cardiovascular: Normal rate, regular rhythm.  Respiratory: Normal respiratory effort without tachypnea nor retractions. Breath sounds are clear  Gastrointestinal: Soft and nontender. No distention.  Musculoskeletal: Nontender with normal range of motion in all extremities.  Neurologic:  Normal speech and language. No gross focal neurologic deficits  Skin:  Skin is warm, dry and intact.  Psychiatric: Mood and affect are normal.   ____________________________________________    EKG  EKG viewed and interpreted by myself shows sinus tachycardia 109 bpm with a  narrow QRS, normal axis, normal intervals, nonspecific ST changes.  ____________________________________________    RADIOLOGY  Chest x-ray shows trace basilar atelectasis.  ____________________________________________   INITIAL IMPRESSION / ASSESSMENT AND PLAN / ED COURSE  Pertinent labs & imaging results that were available during my care of the patient were reviewed by me and considered in my medical decision making (see chart for details).   Patient presents emergency department for chest pain intermittent now more constant x3 days.  Initially patient mildly tachycardic currently heart rate around 80 bpm what appears to be sinus.  Patient states central to left-sided chest discomfort initially intermittent now more constant.  No leg pain or swelling.  No pleuritic pain.  No  cough.  Patient's lab work is reassuring with a negative troponin x2 however her D-dimer has resulted positive.  Patient states she has been told that she cannot get IV contrast due to an allergy.  We will attempt to obtain a VQ scan to rule out pulmonary embolism.  Reassuringly patient is satting 100% on room air with a normal respiratory rate and pulse rate around 80 bpm with no pleuritic symptoms.  Patient agreeable to plan.  VQ scan is negative for pulmonary embolism.  Given the patient's reassuring work-up and negative VQ scan negative COVID/flu and negative troponin x2 a believe the patient is safe for discharge home with PCP follow-up.  Discussed medical chest pain return precautions.  Patient agreeable to plan of care.  [name] was evaluated in Emergency Department on 06/22/2021 for the symptoms described in the history of present illness. She was evaluated in the context of the global COVID-19 pandemic, which necessitated consideration that the patient might be at risk for infection with the SARS-CoV-2 virus that causes COVID-19. Institutional protocols and algorithms that pertain to the evaluation of  patients at risk for COVID-19 are in a state of rapid change based on information released by regulatory bodies including the CDC and federal and state organizations. These policies and algorithms were followed during the patient's care in the ED.  ____________________________________________   FINAL CLINICAL IMPRESSION(S) / ED DIAGNOSES  Chest pain   Harvest Dark, MD 06/22/21 1140

## 2021-06-22 NOTE — ED Notes (Signed)
See triage note  presents with intermittent chest pain for about 3 days   states pain increases with ambulation

## 2021-06-22 NOTE — ED Notes (Signed)
Taken to [facility] for scan

## 2021-06-22 NOTE — ED Notes (Signed)
Nuclear med will be here around 1100 for study.

## 2021-06-27 ENCOUNTER — Telehealth: Payer: Self-pay

## 2021-06-27 NOTE — Telephone Encounter (Signed)
Pt called that she went to ED and they told her to follow up with her PCP due to her labs are abnormal make her appt tomorrow with lauren

## 2021-06-28 ENCOUNTER — Ambulatory Visit: Payer: Self-pay | Admitting: Physician Assistant

## 2021-06-28 ENCOUNTER — Other Ambulatory Visit: Payer: Self-pay

## 2021-06-28 ENCOUNTER — Encounter: Payer: Self-pay | Admitting: Physician Assistant

## 2021-06-28 DIAGNOSIS — R7989 Other specified abnormal findings of blood chemistry: Secondary | ICD-10-CM

## 2021-06-28 DIAGNOSIS — M79604 Pain in right leg: Secondary | ICD-10-CM

## 2021-06-28 DIAGNOSIS — R55 Syncope and collapse: Secondary | ICD-10-CM

## 2021-06-28 DIAGNOSIS — E1165 Type 2 diabetes mellitus with hyperglycemia: Secondary | ICD-10-CM

## 2021-06-28 DIAGNOSIS — E871 Hypo-osmolality and hyponatremia: Secondary | ICD-10-CM

## 2021-06-28 DIAGNOSIS — R0602 Shortness of breath: Secondary | ICD-10-CM

## 2021-06-28 DIAGNOSIS — R079 Chest pain, unspecified: Secondary | ICD-10-CM

## 2021-06-28 LAB — POCT GLYCOSYLATED HEMOGLOBIN (HGB A1C): Hemoglobin A1C: 7.2 % — AB (ref 4.0–5.6)

## 2021-06-28 NOTE — Progress Notes (Signed)
[facility] [place], [place] [address]  Internal MEDICINE  Office Visit Note  Patient Name: [name]  [id number]  [id number]  Date of Service: 06/29/2021  Chief Complaint  Patient presents with   Follow-up   Diabetes   COPD   Dizziness    Has been dizzy and fainting; last episode was this morning, has not had an appetite    HPI Pt is here for ED follow up -Went to ED on 12/29-12/30 due to 3 days of intermittent CP and SOB -EKG did show sinus tach but HR did improve while in ED. Did not have any leg pain or swelling at the time, but due to SOB and elevated d-dimer a VQ scan was done to try to rule out PE. This was negative for PE and her troponins were negativex2 as well. Her vitals were also reassuring and she was discharged home. -She did also have covid/flu tests that were negative. -Pt reports that she does still have the same intermittent CP with SOB and does also admit to some discomfort in the right calf. Denies any swelling, changes in sensation or color and pedal pulses are appreciated on exam.  -Given continued symptoms and now right leg symptoms discussed ordering an [place] of right LE to look for any signs of DVT. She does already take 54m ASA daily. -She also reports she is having chills and fatigue, but no fever has been recorded. Upon reviewing labs during ED visit her WBC count was elevated and her sodium level low. She reports she is dizzy today and had a fainting spell earlier today. She has not eaten much nor has she drank any water and her BP is low.  -Discussed concern for dehydration and possible viral infection other than flu/covid that may be contributing.  -Will repeat labs, though discussed that d-dimer is very nonspecific and may remain elevated without necessarily indicating a blood clot. -Discussed importance of hydrating and increasing electrolytes--advised to drink water as well as gatorade or pedialyte and to make sure she is  eating at regular intervals. She should also rest.  -BG in AM 169 avg. Will increase metformin to BID in a few weeks once feeling better -Discussed importance of going back to ED if any worsening or new symptoms arise.  Current Medication: Outpatient Encounter Medications as of 06/28/2021  Medication Sig   Accu-Chek Softclix Lancets lancets Use as instructed once a daily DX E11.65   aspirin EC 81 MG tablet Take 81 mg by mouth daily. Swallow whole.   atorvastatin (LIPITOR) 10 MG tablet Take 1 tablet (10 mg total) by mouth daily.   Blood Glucose Monitoring Suppl (ACCU-CHEK GUIDE ME) w/Device KIT Use as directed DX E11.65   glucose blood (ACCU-CHEK GUIDE) test strip Use as directed once a daily DX E11.65   ipratropium-albuterol (DUONEB) 0.5-2.5 (3) MG/3ML SOLN Take 3 mLs by nebulization every 6 (six) hours as needed. (Patient taking differently: Take 3 mLs by nebulization 2 (two) times daily.)   metFORMIN (GLUCOPHAGE) 500 MG tablet Take 1 tablet (500 mg total) by mouth daily with breakfast.   mometasone-formoterol (DULERA) 200-5 MCG/ACT AERO Inhale 2 puffs into the lungs 2 (two) times daily.   sucralfate (CARAFATE) 1 g tablet Take 1 tablet (1 g total) by mouth 4 (four) times daily as needed (for abdominal discomfort, nausea, and/or vomiting).   Tiotropium Bromide-Olodaterol (STIOLTO RESPIMAT) 2.5-2.5 MCG/ACT AERS Inhale 2 puffs into the lungs daily.   No facility-administered encounter medications on  file as of 06/28/2021.    Surgical History: Past Surgical History:  Procedure Laterality Date   ABDOMINAL HYSTERECTOMY     APPENDECTOMY     BACK SURGERY     COLONOSCOPY WITH PROPOFOL N/A 06/01/2019   Procedure: COLONOSCOPY WITH PROPOFOL;  Surgeon: Jonathon Bellows, MD;  Location: [facility] ENDOSCOPY;  Service: Gastroenterology;  Laterality: N/A;   LEFT HEART CATH AND CORONARY ANGIOGRAPHY Left 03/24/2020   Procedure: LEFT HEART CATH AND CORONARY ANGIOGRAPHY;  Surgeon: Corey Skains, MD;  Location: [place] CV LAB;  Service: Cardiovascular;  Laterality: Left;    Medical History: Past Medical History:  Diagnosis Date   Back pain    Cancer ([place])    + CANCER CELLS TO UTERUS   COPD (chronic obstructive pulmonary disease) ([place])    Diabetes mellitus without complication (HCC)    Family history of adverse reaction to anesthesia    FATHER STOPS BREATHING WITH ANESTHESIA   Headache    HX MIGRAINES   Seizures (H[facility])    1 SEIZURE  VERY STRESSED    Family History: Family History  Problem Relation Age of Onset   Breast cancer Maternal Grandmother 74    Social History   Socioeconomic History   Marital status: Single    Spouse name: Not on file   Number of children: 2   Years of education: Not on file   Highest education level: Not on file  Occupational History   Not on file  Tobacco Use   Smoking status: Light Smoker    Packs/day: 0.50    Years: 28.00    Pack years: 14.00    Types: Cigarettes   Smokeless tobacco: Never   Tobacco comments:    smokes a few a day maybe  Vaping Use   Vaping Use: Never used  Substance and Sexual Activity   Alcohol use: No    Alcohol/week: 0.0 standard drinks   Drug use: No   Sexual activity: Not on file  Other Topics Concern   Not on file  Social History Narrative   Lives with boyfriend    Social Determinants of Health   Financial Resource Strain: Not on file  Food Insecurity: Not on file  Transportation Needs: Not on file  Physical Activity: Not on file  Stress: Not on file  Social Connections: Not on file  Intimate Partner Violence: Not on file      Review of Systems  Constitutional:  Positive for appetite change, chills and fatigue. Negative for unexpected weight change.  HENT:  Negative for congestion, rhinorrhea, sneezing and sore throat.   Eyes:  Negative for redness.  Respiratory:  Positive for shortness of breath. Negative for cough, chest tightness and wheezing.   Cardiovascular:  Positive for chest pain. Negative  for palpitations.  Gastrointestinal:  Negative for abdominal pain, constipation, diarrhea, nausea and vomiting.  Genitourinary:  Negative for dysuria and frequency.  Musculoskeletal:  Positive for myalgias. Negative for arthralgias, back pain, joint swelling and neck pain.       Pain in right calf  Skin:  Negative for rash.  Neurological:  Positive for syncope. Negative for tremors, numbness and headaches.  Hematological:  Negative for adenopathy. Does not bruise/bleed easily.  Psychiatric/Behavioral:  Negative for behavioral problems (Depression), sleep disturbance and suicidal ideas. The patient is nervous/anxious.    Vital Signs: Temp 98.3 F (36.8 C)    Resp 16    Ht '5\' 4"'  (1.626 m)    Wt 167 lb 3.2 oz (75.8 kg)  SpO2 96%    BMI 28.70 kg/m    Physical Exam Vitals and nursing note reviewed.  Constitutional:      General: She is not in acute distress.    Appearance: She is well-developed. She is not diaphoretic.  HENT:     Head: Normocephalic and atraumatic.     Mouth/Throat:     Pharynx: No oropharyngeal exudate.  Eyes:     Pupils: Pupils are equal, round, and reactive to light.  Neck:     Thyroid: No thyromegaly.     Vascular: No JVD.     Trachea: No tracheal deviation.  Cardiovascular:     Rate and Rhythm: Normal rate and regular rhythm.     Pulses: Normal pulses.     Heart sounds: Normal heart sounds. No murmur heard.   No friction rub. No gallop.  Pulmonary:     Effort: Pulmonary effort is normal. No respiratory distress.     Breath sounds: No wheezing or rales.  Chest:     Chest wall: No tenderness.  Abdominal:     General: Bowel sounds are normal.     Palpations: Abdomen is soft.  Musculoskeletal:        General: No swelling. Normal range of motion.     Cervical back: Normal range of motion and neck supple.     Comments: Pain in right calf, no swelling, full sensation and distal pulses intact  Lymphadenopathy:     Cervical: No cervical adenopathy.  Skin:     General: Skin is warm and dry.  Neurological:     Mental Status: She is alert and oriented to person, place, and time.     Cranial Nerves: No cranial nerve deficit.  Psychiatric:        Behavior: Behavior normal.        Thought Content: Thought content normal.        Judgment: Judgment normal.       Assessment/Plan: 1. Uncontrolled type 2 diabetes mellitus with hyperglycemia (HCC) - POCT glycosylated hemoglobin (Hb A1C) is 7.2 which is stable from last visit.  Discussed continuing once a day metformin for now but increasing to twice daily once acute symptoms resolve.  2. Chest pain, unspecified type Intermittent chest pain consistent with what she was feeling when she went to the ED and had normal work-up.  Discussed returning to ED if any worsening.  Also discussed getting back in touch with cardiology for routine follow-up and management  3. Shortness of breath Negative VQ scan while in the emergency department and oxygen saturations normal in office.  We will continue to monitor symptoms and if getting any worse patient advised to return to ED  4. Right leg pain Ultrasound of right lower extremity due to some emerging right leg pain and history of positive D-dimer.  Did discuss that her D-dimer is very nonspecific and does not necessarily indicate a blood clot is present we will go ahead and investigate further due to symptoms.  Patient advised to return to ED if any worsening or changes and to continue daily ASA. - VAS [place] LOWER EXTREMITY VENOUS (DVT)  5. Elevated d-dimer Left follow-up labs though explained that this is nonspecific and may remain elevated.  Will order ultrasound of right lower extremity - VAS [place] LOWER EXTREMITY VENOUS (DVT) - D-Dimer, Quantitative  6. Syncope and collapse Likely secondary to dehydration and lack of oral intake.  Patient encouraged to eat and stay well-hydrated with both water and electrolytes such as Gatorade  or Pedialyte.  Advised to also rest  and take it easy and will repeat blood work. Again advised to return to ED if worsening condition or if repeat occurrence, especially if she were to hit her head during any episode. - CBC w/Diff/Platelet - Comprehensive metabolic panel  7. Low sodium levels Advised to hydrate and increase electrolytes with Gatorade or Pedialyte.  We will repeat blood work   General Counseling: Vlasta verbalizes understanding of the findings of todays visit and agrees with plan of treatment. I have discussed any further diagnostic evaluation that may be needed or ordered today. We also reviewed her medications today. she has been encouraged to call the office with any questions or concerns that should arise related to todays visit.    Orders Placed This Encounter  Procedures   CBC w/Diff/Platelet   Comprehensive metabolic panel   D-Dimer, Quantitative   POCT glycosylated hemoglobin (Hb A1C)   VAS [place] LOWER EXTREMITY VENOUS (DVT)    No orders of the defined types were placed in this encounter.   This patient was seen by Drema Dallas, PA-C in collaboration with Dr. Clayborn Bigness as a part of collaborative care agreement.   Total time spent:40 Minutes Time spent includes review of chart, medications, test results, and follow up plan with the patient.      Dr Lavera Guise Internal medicine

## 2021-07-02 ENCOUNTER — Ambulatory Visit: Payer: Self-pay | Admitting: Physician Assistant

## 2021-07-03 ENCOUNTER — Ambulatory Visit: Payer: Self-pay | Admitting: Internal Medicine

## 2021-07-03 ENCOUNTER — Encounter: Payer: Self-pay | Admitting: Internal Medicine

## 2021-07-03 ENCOUNTER — Other Ambulatory Visit: Payer: Self-pay

## 2021-07-03 VITALS — BP 118/83 | HR 106 | Temp 98.3°F | Resp 16 | Ht 64.0 in | Wt 166.8 lb

## 2021-07-03 DIAGNOSIS — J449 Chronic obstructive pulmonary disease, unspecified: Secondary | ICD-10-CM

## 2021-07-03 DIAGNOSIS — F17209 Nicotine dependence, unspecified, with unspecified nicotine-induced disorders: Secondary | ICD-10-CM

## 2021-07-03 DIAGNOSIS — R7989 Other specified abnormal findings of blood chemistry: Secondary | ICD-10-CM

## 2021-07-03 DIAGNOSIS — R079 Chest pain, unspecified: Secondary | ICD-10-CM

## 2021-07-03 NOTE — Progress Notes (Signed)
[facility] [place], [place] [address]  Pulmonary Sleep Medicine   Office Visit Note  Patient Name: [name] DOB: Dec 25, 1970 MRN [id number]  Date of Service: 07/03/2021  Complaints/HPI: COPD she has mild disease. She states she has been having more SOB with the colder weather. She also is still smoking. She states that she is being evaluated for blood clots. She was in the ER with chest pain worked up for PE and had a negative VQ scan. She does have a LE doppler scheduled to make certain that there is no deep venous thrombosis present.  ROS  General: (-) fever, (-) chills, (-) night sweats, (-) weakness Skin: (-) rashes, (-) itching,. Eyes: (-) visual changes, (-) redness, (-) itching. Nose and Sinuses: (-) nasal stuffiness or itchiness, (-) postnasal drip, (-) nosebleeds, (-) sinus trouble. Mouth and Throat: (-) sore throat, (-) hoarseness. Neck: (-) swollen glands, (-) enlarged thyroid, (-) neck pain. Respiratory: + cough, (-) bloody sputum, + shortness of breath, - wheezing. Cardiovascular: - ankle swelling, (-) chest pain. Lymphatic: (-) lymph node enlargement. Neurologic: (-) numbness, (-) tingling. Psychiatric: (-) anxiety, (-) depression   Current Medication: Outpatient Encounter Medications as of 07/03/2021  Medication Sig   Accu-Chek Softclix Lancets lancets Use as instructed once a daily DX E11.65   aspirin EC 81 MG tablet Take 81 mg by mouth daily. Swallow whole.   atorvastatin (LIPITOR) 10 MG tablet Take 1 tablet (10 mg total) by mouth daily.   Blood Glucose Monitoring Suppl (ACCU-CHEK GUIDE ME) w/Device KIT Use as directed DX E11.65   glucose blood (ACCU-CHEK GUIDE) test strip Use as directed once a daily DX E11.65   ipratropium-albuterol (DUONEB) 0.5-2.5 (3) MG/3ML SOLN Take 3 mLs by nebulization every 6 (six) hours as needed. (Patient taking differently: Take 3 mLs by nebulization 2 (two) times daily.)   metFORMIN (GLUCOPHAGE)  500 MG tablet Take 1 tablet (500 mg total) by mouth daily with breakfast.   mometasone-formoterol (DULERA) 200-5 MCG/ACT AERO Inhale 2 puffs into the lungs 2 (two) times daily.   sucralfate (CARAFATE) 1 g tablet Take 1 tablet (1 g total) by mouth 4 (four) times daily as needed (for abdominal discomfort, nausea, and/or vomiting).   Tiotropium Bromide-Olodaterol (STIOLTO RESPIMAT) 2.5-2.5 MCG/ACT AERS Inhale 2 puffs into the lungs daily.   No facility-administered encounter medications on file as of 07/03/2021.    Surgical History: Past Surgical History:  Procedure Laterality Date   ABDOMINAL HYSTERECTOMY     APPENDECTOMY     BACK SURGERY     COLONOSCOPY WITH PROPOFOL N/A 06/01/2019   Procedure: COLONOSCOPY WITH PROPOFOL;  Surgeon: Jonathon Bellows, MD;  Location: [facility] ENDOSCOPY;  Service: Gastroenterology;  Laterality: N/A;   LEFT HEART CATH AND CORONARY ANGIOGRAPHY Left 03/24/2020   Procedure: LEFT HEART CATH AND CORONARY ANGIOGRAPHY;  Surgeon: Corey Skains, MD;  Location: [place] CV LAB;  Service: Cardiovascular;  Laterality: Left;    Medical History: Past Medical History:  Diagnosis Date   Back pain    Cancer ([place])    + CANCER CELLS TO UTERUS   COPD (chronic obstructive pulmonary disease) ([place])    Diabetes mellitus without complication (HCC)    Family history of adverse reaction to anesthesia    FATHER STOPS BREATHING WITH ANESTHESIA   Headache    HX MIGRAINES   Seizures ([place])    1 SEIZURE  VERY STRESSED    Family History: Family History  Problem Relation Age of Onset  Breast cancer Maternal Grandmother 25    Social History: Social History   Socioeconomic History   Marital status: Single    Spouse name: Not on file   Number of children: 2   Years of education: Not on file   Highest education level: Not on file  Occupational History   Not on file  Tobacco Use   Smoking status: Light Smoker    Packs/day: 0.50    Years: 28.00    Pack years: 14.00    Types:  Cigarettes   Smokeless tobacco: Never   Tobacco comments:    smokes a few a day maybe  Vaping Use   Vaping Use: Never used  Substance and Sexual Activity   Alcohol use: No    Alcohol/week: 0.0 standard drinks   Drug use: No   Sexual activity: Not on file  Other Topics Concern   Not on file  Social History Narrative   Lives with boyfriend    Social Determinants of Health   Financial Resource Strain: Not on file  Food Insecurity: Not on file  Transportation Needs: Not on file  Physical Activity: Not on file  Stress: Not on file  Social Connections: Not on file  Intimate Partner Violence: Not on file    Vital Signs: Blood pressure 118/83, pulse (!) 106, temperature 98.3 F (36.8 C), resp. rate 16, height $RemoveBe'5\' 4"'yQvDfzPQT$  (1.626 m), weight 166 lb 12.8 oz (75.7 kg), SpO2 98 %.  Examination: General Appearance: The patient is well-developed, well-nourished, and in no distress. Skin: Gross inspection of skin unremarkable. Head: normocephalic, no gross deformities. Eyes: no gross deformities noted. ENT: ears appear grossly normal no exudates. Neck: Supple. No thyromegaly. No LAD. Respiratory: no rhochi oted at this time. Cardiovascular: Normal S1 and S2 without murmur or rub. Extremities: No cyanosis. pulses are equal. Neurologic: Alert and oriented. No involuntary movements.  LABS: Recent Results (from the past 2160 hour(s))  POCT glycosylated hemoglobin (Hb A1C)     Status: Abnormal   Collection Time: 04/05/21  4:26 PM  Result Value Ref Range   Hemoglobin A1C 7.2 (A) 4.0 - 5.6 %   HbA1c POC (<> result, manual entry)     HbA1c, POC (prediabetic range)     HbA1c, POC (controlled diabetic range)    Basic metabolic panel     Status: Abnormal   Collection Time: 06/21/21  7:45 PM  Result Value Ref Range   Sodium 133 (L) 135 - 145 mmol/L   Potassium 4.3 3.5 - 5.1 mmol/L   Chloride 101 98 - 111 mmol/L   CO2 22 22 - 32 mmol/L   Glucose, Bld 162 (H) 70 - 99 mg/dL    Comment: Glucose  reference range applies only to samples taken after fasting for at least 8 hours.   BUN 11 6 - 20 mg/dL   Creatinine, Ser 0.85 0.44 - 1.00 mg/dL   Calcium 9.5 8.9 - 10.3 mg/dL   GFR, Estimated >60 >60 mL/min    Comment: (NOTE) Calculated using the CKD-EPI Creatinine Equation (2021)    Anion gap 10 5 - 15    Comment: Performed at [facility], [address] [place], [place] [address]  CBC     Status: Abnormal   Collection Time: 06/21/21  7:45 PM  Result Value Ref Range   WBC 13.0 (H) 4.0 - 10.5 K/uL   RBC 4.16 3.87 - 5.11 MIL/uL   Hemoglobin 13.4 12.0 - 15.0 g/dL   HCT 39.9 36.0 - 46.0 %  MCV 95.9 80.0 - 100.0 fL   MCH 32.2 26.0 - 34.0 pg   MCHC 33.6 30.0 - 36.0 g/dL   RDW 12.7 11.5 - 15.5 %   Platelets 311 150 - 400 K/uL   nRBC 0.0 0.0 - 0.2 %    Comment: Performed at [facility], [place], [place] [address]  Troponin I (High Sensitivity)     Status: None   Collection Time: 06/21/21  7:45 PM  Result Value Ref Range   Troponin I (High Sensitivity) 3 <18 ng/L    Comment: (NOTE) Elevated high sensitivity troponin I (hsTnI) values and significant  changes across serial measurements may suggest ACS but many other  chronic and acute conditions are known to elevate hsTnI results.  Refer to the "Links" section for chest pain algorithms and additional  guidance. Performed at [facility], [address] [place], [place] [address]   D-dimer, quantitative     Status: Abnormal   Collection Time: 06/21/21  7:45 PM  Result Value Ref Range   D-Dimer, Quant 6.78 (H) 0.00 - 0.50 ug/mL-FEU    Comment: (NOTE) At the manufacturer cut-off value of 0.5 g/mL FEU, this assay has a negative predictive value of 95-100%.This assay is intended for use in conjunction with a clinical pretest probability (PTP) assessment model to exclude pulmonary embolism (PE) and deep venous thrombosis (DVT) in outpatients suspected of PE or DVT. Results should be  correlated with clinical presentation. Performed at [facility], [address] [place], [place] [address]   Hepatic function panel     Status: None   Collection Time: 06/21/21  7:45 PM  Result Value Ref Range   Total Protein 7.2 6.5 - 8.1 g/dL   Albumin 3.8 3.5 - 5.0 g/dL   AST 17 15 - 41 U/L   ALT 13 0 - 44 U/L   Alkaline Phosphatase 83 38 - 126 U/L   Total Bilirubin 0.4 0.3 - 1.2 mg/dL   Bilirubin, Direct <0.1 0.0 - 0.2 mg/dL   Indirect Bilirubin NOT CALCULATED 0.3 - 0.9 mg/dL    Comment: Performed at [facility], [address] [place], [place] [address]  Lipase, blood     Status: None   Collection Time: 06/21/21  7:45 PM  Result Value Ref Range   Lipase 50 11 - 51 U/L    Comment: Performed at [facility], [place], [place] [address]  Troponin I (High Sensitivity)     Status: None   Collection Time: 06/22/21 12:20 AM  Result Value Ref Range   Troponin I (High Sensitivity) 4 <18 ng/L    Comment: (NOTE) Elevated high sensitivity troponin I (hsTnI) values and significant  changes across serial measurements may suggest ACS but many other  chronic and acute conditions are known to elevate hsTnI results.  Refer to the "Links" section for chest pain algorithms and additional  guidance. Performed at [facility], [address] [place],  [address]   hCG, quantitative, pregnancy     Status: None   Collection Time: 06/22/21 12:20 AM  Result Value Ref Range   hCG, Beta Chain, Quant, S 3 <5 mIU/mL    Comment:          GEST. AGE      CONC.  (mIU/mL)   <=1 WEEK        5 - 50     2 WEEKS       50 - 500  3 WEEKS       100 - 10,000     4 WEEKS     1,000 - 30,000     5 WEEKS     3,500 - 115,000   6-8 WEEKS     12,000 - 270,000    12 WEEKS     15,000 - 220,000        FEMALE AND NON-PREGNANT FEMALE:     LESS THAN 5 mIU/mL Performed at [facility], [place], [place] [address]   Resp Panel  by RT-PCR (Flu A&B, Covid) Nasopharyngeal Swab     Status: None   Collection Time: 06/22/21  7:40 AM   Specimen: Nasopharyngeal Swab; Nasopharyngeal(NP) swabs in vial transport medium  Result Value Ref Range   SARS Coronavirus 2 by RT PCR NEGATIVE NEGATIVE    Comment: (NOTE) SARS-CoV-2 target nucleic acids are NOT DETECTED.  The SARS-CoV-2 RNA is generally detectable in upper respiratory specimens during the acute phase of infection. The lowest concentration of SARS-CoV-2 viral copies this assay can detect is 138 copies/mL. A negative result does not preclude SARS-Cov-2 infection and should not be used as the sole basis for treatment or other patient management decisions. A negative result may occur with  improper specimen collection/handling, submission of specimen other than nasopharyngeal swab, presence of viral mutation(s) within the areas targeted by this assay, and inadequate number of viral copies(<138 copies/mL). A negative result must be combined with clinical observations, patient history, and epidemiological information. The expected result is Negative.  Fact Sheet for Patients:  EntrepreneurPulse.com.au  Fact Sheet for Healthcare Providers:  IncredibleEmployment.be  This test is no t yet approved or cleared by the [place] FDA and  has been authorized for detection and/or diagnosis of SARS-CoV-2 by FDA under an Emergency Use Authorization (EUA). This EUA will remain  in effect (meaning this test can be used) for the duration of the COVID-19 declaration under Section 564(b)(1) of the Act, 21 U.S.C.section 360bbb-3(b)(1), unless the authorization is terminated  or revoked sooner.       Influenza A by PCR NEGATIVE NEGATIVE   Influenza B by PCR NEGATIVE NEGATIVE    Comment: (NOTE) The Xpert Xpress SARS-CoV-2/FLU/RSV plus assay is intended as an aid in the diagnosis of influenza from Nasopharyngeal swab specimens and should not  be used as a sole basis for treatment. Nasal washings and aspirates are unacceptable for Xpert Xpress SARS-CoV-2/FLU/RSV testing.  Fact Sheet for Patients: EntrepreneurPulse.com.au  Fact Sheet for Healthcare Providers: IncredibleEmployment.be  This test is not yet approved or cleared by the [place] FDA and has been authorized for detection and/or diagnosis of SARS-CoV-2 by FDA under an Emergency Use Authorization (EUA). This EUA will remain in effect (meaning this test can be used) for the duration of the COVID-19 declaration under Section 564(b)(1) of the Act, 21 U.S.C. section 360bbb-3(b)(1), unless the authorization is terminated or revoked.  Performed at [facility], [address] [place],  [address]   POCT glycosylated hemoglobin (Hb A1C)     Status: Abnormal   Collection Time: 06/28/21  3:28 PM  Result Value Ref Range   Hemoglobin A1C 7.2 (A) 4.0 - 5.6 %   HbA1c POC (<> result, manual entry)     HbA1c, POC (prediabetic range)     HbA1c, POC (controlled diabetic range)      Radiology: NM Pulmonary Perfusion  Result Date: 06/22/2021 CLINICAL DATA:  Intermittent chest pain and shortness of breath for the past  3 days. Elevated D-dimer. EXAM: NUCLEAR MEDICINE PERFUSION LUNG SCAN TECHNIQUE: Perfusion images were obtained in multiple projections after intravenous injection of radiopharmaceutical. Ventilation scans intentionally deferred if perfusion scan and chest x-ray adequate for interpretation during COVID 19 epidemic. RADIOPHARMACEUTICALS:  4.5 mCi Tc-4mMAA IV COMPARISON:  Chest x-ray from yesterday. FINDINGS: No wedge shaped peripheral perfusion defects to suggest acute pulmonary embolism. IMPRESSION: Pulmonary embolism absent. Electronically Signed   By: WTitus DubinM.D.   On: 06/22/2021 11:03    No results found.  DG Chest 2 View  Result Date: 06/21/2021 CLINICAL DATA:  Chest pain x3 days. EXAM: CHEST  - 2 VIEW COMPARISON:  July 21, 2020 FINDINGS: A trace amount of atelectasis is noted within the anterior aspect of the left lung base. There is no evidence of acute infiltrate, pleural effusion or pneumothorax. The heart size and mediastinal contours are within normal limits. The visualized skeletal structures are unremarkable. IMPRESSION: Trace amount of left basilar atelectasis. Electronically Signed   By: TVirgina NorfolkM.D.   On: 06/21/2021 20:17   NM Pulmonary Perfusion  Result Date: 06/22/2021 CLINICAL DATA:  Intermittent chest pain and shortness of breath for the past 3 days. Elevated D-dimer. EXAM: NUCLEAR MEDICINE PERFUSION LUNG SCAN TECHNIQUE: Perfusion images were obtained in multiple projections after intravenous injection of radiopharmaceutical. Ventilation scans intentionally deferred if perfusion scan and chest x-ray adequate for interpretation during COVID 19 epidemic. RADIOPHARMACEUTICALS:  4.5 mCi Tc-924mAA IV COMPARISON:  Chest x-ray from yesterday. FINDINGS: No wedge shaped peripheral perfusion defects to suggest acute pulmonary embolism. IMPRESSION: Pulmonary embolism absent. Electronically Signed   By: WiTitus Dubin.D.   On: 06/22/2021 11:03      Assessment and Plan: Patient Active Problem List   Diagnosis Date Noted   Benign essential HTN 04/24/2020   Coronary artery disease involving native coronary artery of native heart 04/24/2020   Hyperlipidemia, mixed 04/24/2020   Angina pectoris (HCHoll[place]09/12/2019   COPD (chronic obstructive pulmonary disease) with chronic bronchitis (HCFredonia08/02/2020   Atherosclerosis of abdominal aorta (HCA[place]03/19/2021   Grade I hemorrhoids 07/27/2019   Degenerative disc disease, lumbar 08/24/2015   1. Chronic obstructive pulmonary disease, unspecified COPD type (HCTaylorWe will continue with present management spirometry done reviewed as far as the breathing is concerned may be more concerning for cardiac issues so I suggest getting an  echocardiogram to be done.  2. Elevated d-dimer I explained to her that an elevated D-dimer by itself is not diagnostic of blood clots however a negative D-dimer would most certainly rule it out.  We will to do further work-up suggested getting Doppler extremity studies done  3. Tobacco use disorder, continuous Encouraged very strongly to quit smoking and smoking itself would increase her risk for development of clots  4. Chest pain, unspecified type Unclear etiology but appears to be atypical and has been worked up as already mentioned we will get a follow-up echocardiogram - ECHOCARDIOGRAM COMPLETE; Future   General Counseling: I have discussed the findings of the evaluation and examination with AnLevada Dy I have also discussed any further diagnostic evaluation thatmay be needed or ordered today. AnBrittainyerbalizes understanding of the findings of todays visit. We also reviewed her medications today and discussed drug interactions and side effects including but not limited excessive drowsiness and altered mental states. We also discussed that there is always a risk not just to her but also people around her. she has been encouraged to call the office with any questions or  concerns that should arise related to todays visit.  Orders Placed This Encounter  Procedures   ECHOCARDIOGRAM COMPLETE    Standing Status:   Future    Standing Expiration Date:   07/03/2022    Order Specific Question:   Where should this test be performed    Answer:   External    Order Specific Question:   Perflutren DEFINITY (image enhancing agent) should be administered unless hypersensitivity or allergy exist    Answer:   Administer Perflutren    Order Specific Question:   Is a special reader required? (athlete or structural heart)    Answer:   No    Order Specific Question:   Does this study need to be read by the Structural team/Level 3 readers?    Answer:   No    Order Specific Question:   Reason for exam-Echo     Answer:   Dyspnea  R06.00    Order Specific Question:   Reason for exam-Echo    Answer:   Chest Pain  R07.9     Time spent: 10  I have personally obtained a history, examined the patient, evaluated laboratory and imaging results, formulated the assessment and plan and placed orders.    Allyne Gee, MD [facility] Pulmonary and Critical Care Sleep medicine

## 2021-07-03 NOTE — Patient Instructions (Signed)

## 2021-07-04 ENCOUNTER — Ambulatory Visit (INDEPENDENT_AMBULATORY_CARE_PROVIDER_SITE_OTHER): Payer: Self-pay

## 2021-07-04 ENCOUNTER — Telehealth: Payer: Self-pay

## 2021-07-04 DIAGNOSIS — M79604 Pain in right leg: Secondary | ICD-10-CM

## 2021-07-04 DIAGNOSIS — R7989 Other specified abnormal findings of blood chemistry: Secondary | ICD-10-CM

## 2021-07-04 NOTE — Telephone Encounter (Signed)
LMOM to let patient know about negative ultrasound for DVTs.

## 2021-07-18 ENCOUNTER — Other Ambulatory Visit: Payer: Self-pay

## 2021-07-26 ENCOUNTER — Ambulatory Visit: Payer: 59 | Admitting: Physician Assistant

## 2021-07-27 ENCOUNTER — Telehealth: Payer: Self-pay

## 2021-07-27 NOTE — Telephone Encounter (Signed)
Left vm to confirm 08/01/21 u/s appointment-Toni

## 2021-08-01 ENCOUNTER — Other Ambulatory Visit: Payer: Self-pay

## 2021-08-01 ENCOUNTER — Ambulatory Visit: Payer: 59

## 2021-08-01 DIAGNOSIS — R079 Chest pain, unspecified: Secondary | ICD-10-CM | POA: Diagnosis not present

## 2021-08-13 ENCOUNTER — Encounter: Payer: Self-pay | Admitting: Physician Assistant

## 2021-08-13 ENCOUNTER — Ambulatory Visit (INDEPENDENT_AMBULATORY_CARE_PROVIDER_SITE_OTHER): Payer: 59 | Admitting: Physician Assistant

## 2021-08-13 ENCOUNTER — Other Ambulatory Visit: Payer: Self-pay

## 2021-08-13 ENCOUNTER — Telehealth: Payer: Self-pay

## 2021-08-13 DIAGNOSIS — R5383 Other fatigue: Secondary | ICD-10-CM

## 2021-08-13 DIAGNOSIS — K047 Periapical abscess without sinus: Secondary | ICD-10-CM

## 2021-08-13 DIAGNOSIS — G4710 Hypersomnia, unspecified: Secondary | ICD-10-CM

## 2021-08-13 DIAGNOSIS — E559 Vitamin D deficiency, unspecified: Secondary | ICD-10-CM

## 2021-08-13 DIAGNOSIS — E538 Deficiency of other specified B group vitamins: Secondary | ICD-10-CM

## 2021-08-13 DIAGNOSIS — E782 Mixed hyperlipidemia: Secondary | ICD-10-CM | POA: Diagnosis not present

## 2021-08-13 DIAGNOSIS — G4734 Idiopathic sleep related nonobstructive alveolar hypoventilation: Secondary | ICD-10-CM | POA: Diagnosis not present

## 2021-08-13 MED ORDER — AMOXICILLIN-POT CLAVULANATE 875-125 MG PO TABS
1.0000 | ORAL_TABLET | Freq: Two times a day (BID) | ORAL | 0 refills | Status: DC
Start: 2021-08-13 — End: 2021-08-29

## 2021-08-13 NOTE — Progress Notes (Signed)
[facility] [place], [place] [address]  Internal MEDICINE  Office Visit Note  Patient Name: [name]  [id number]  [id number]  Date of Service: 08/19/2021  Chief Complaint  Patient presents with   Follow-up   Diabetes   COPD    HPI Pt is here for routine follow up -Echo results looked good with normal LV size and EF.  No diastolic dysfunction or valvular abnormalities noted. -some burning radiating down leg off and on for a while, but gabapentin knocked her out in the past and does not want to take it -tooth infection currently. This has been several rounds that put her health at risk. Medically necessary to have teeth pulled.  She will discuss with dentist and insurance, as she is concerned that insurance will not cover procedure however procedure is medically necessary due to repeat infections putting her health at risk -Currently has oxygen at nighttime and is unsure whether she still needs this.  Would like to know whether or not she should turn and oxygen.  -She does report witnessed apneas at night and snoring, and is always tired.  Therefore we will go ahead and order PSG rather than just ON now to evaluate oxygen level as she is displaying signs of sleep apnea as well that need to be evaluated and treated as appropriate EPWORTH SLEEPINESS SCALE:  Scale:  (0)= no chance of dozing; (1)= slight chance of dozing; (2)= moderate chance of dozing; (3)= high chance of dozing  Chance  Situtation    Sitting and reading: 3    Watching TV: 3    Sitting Inactive in public: 2    As a passenger in car: 3      Lying down to rest: 2    Sitting and talking: 0    Sitting quielty after lunch: 2    In a car, stopped in traffic: 0   TOTAL SCORE:   15 out of 24  Current Medication: Outpatient Encounter Medications as of 08/13/2021  Medication Sig   Accu-Chek Softclix Lancets lancets Use as instructed once a daily DX E11.65    amoxicillin-clavulanate (AUGMENTIN) 875-125 MG tablet Take 1 tablet by mouth 2 (two) times daily.   aspirin EC 81 MG tablet Take 81 mg by mouth daily. Swallow whole.   atorvastatin (LIPITOR) 10 MG tablet Take 1 tablet (10 mg total) by mouth daily.   Blood Glucose Monitoring Suppl (ACCU-CHEK GUIDE ME) w/Device KIT Use as directed DX E11.65   glucose blood (ACCU-CHEK GUIDE) test strip Use as directed once a daily DX E11.65   ipratropium-albuterol (DUONEB) 0.5-2.5 (3) MG/3ML SOLN Take 3 mLs by nebulization every 6 (six) hours as needed. (Patient taking differently: Take 3 mLs by nebulization 2 (two) times daily.)   metFORMIN (GLUCOPHAGE) 500 MG tablet Take 1 tablet (500 mg total) by mouth daily with breakfast.   mometasone-formoterol (DULERA) 200-5 MCG/ACT AERO Inhale 2 puffs into the lungs 2 (two) times daily.   sucralfate (CARAFATE) 1 g tablet Take 1 tablet (1 g total) by mouth 4 (four) times daily as needed (for abdominal discomfort, nausea, and/or vomiting).   Tiotropium Bromide-Olodaterol (STIOLTO RESPIMAT) 2.5-2.5 MCG/ACT AERS Inhale 2 puffs into the lungs daily.   No facility-administered encounter medications on file as of 08/13/2021.    Surgical History: Past Surgical History:  Procedure Laterality Date   ABDOMINAL HYSTERECTOMY     APPENDECTOMY     BACK SURGERY     COLONOSCOPY WITH PROPOFOL N/A 06/01/2019  Procedure: COLONOSCOPY WITH PROPOFOL;  Surgeon: Jonathon Bellows, MD;  Location: [facility] ENDOSCOPY;  Service: Gastroenterology;  Laterality: N/A;   LEFT HEART CATH AND CORONARY ANGIOGRAPHY Left 03/24/2020   Procedure: LEFT HEART CATH AND CORONARY ANGIOGRAPHY;  Surgeon: Corey Skains, MD;  Location: [facility];  Service: Cardiovascular;  Laterality: Left;    Medical History: Past Medical History:  Diagnosis Date   Back pain    Cancer ([place])    + CANCER CELLS TO UTERUS   COPD (chronic obstructive pulmonary disease) ([place])    Diabetes mellitus without complication (HCC)     Family history of adverse reaction to anesthesia    FATHER STOPS BREATHING WITH ANESTHESIA   Headache    HX MIGRAINES   Seizures (H[facility])    1 SEIZURE  VERY STRESSED    Family History: Family History  Problem Relation Age of Onset   Breast cancer Maternal Grandmother 48    Social History   Socioeconomic History   Marital status: Single    Spouse name: Not on file   Number of children: 2   Years of education: Not on file   Highest education level: Not on file  Occupational History   Not on file  Tobacco Use   Smoking status: Light Smoker    Packs/day: 0.50    Years: 28.00    Pack years: 14.00    Types: Cigarettes   Smokeless tobacco: Never   Tobacco comments:    Half a pack  Vaping Use   Vaping Use: Never used  Substance and Sexual Activity   Alcohol use: No    Alcohol/week: 0.0 standard drinks   Drug use: No   Sexual activity: Not on file  Other Topics Concern   Not on file  Social History Narrative   Lives with boyfriend    Social Determinants of Health   Financial Resource Strain: Not on file  Food Insecurity: Not on file  Transportation Needs: Not on file  Physical Activity: Not on file  Stress: Not on file  Social Connections: Not on file  Intimate Partner Violence: Not on file      Review of Systems  Constitutional:  Positive for fatigue. Negative for chills and unexpected weight change.  HENT:  Negative for congestion, postnasal drip, rhinorrhea, sneezing and sore throat.   Eyes:  Negative for redness.  Respiratory:  Negative for cough, chest tightness and shortness of breath.   Cardiovascular:  Negative for chest pain and palpitations.  Gastrointestinal:  Negative for abdominal pain, constipation, diarrhea, nausea and vomiting.  Genitourinary:  Negative for dysuria and frequency.  Musculoskeletal:  Negative for arthralgias, back pain, joint swelling and neck pain.  Skin:  Negative for rash.  Neurological: Negative.  Negative for tremors and  numbness.  Hematological:  Negative for adenopathy. Does not bruise/bleed easily.  Psychiatric/Behavioral:  Positive for sleep disturbance. Negative for behavioral problems (Depression) and suicidal ideas. The patient is not nervous/anxious.    Vital Signs: BP 118/84 Comment: 119/93   Pulse 78    Temp 98 F (36.7 C)    Resp 16    Ht _0  (1.626 m)    Wt 168 lb 12.8 oz (76.6 kg)    SpO2 99%    BMI 28.97 kg/m    Physical Exam Vitals and nursing note reviewed.  Constitutional:      General: She is not in acute distress.    Appearance: She is well-developed. She is not diaphoretic.  HENT:  Head: Normocephalic and atraumatic.     Mouth/Throat:     Pharynx: No oropharyngeal exudate.  Eyes:     Pupils: Pupils are equal, round, and reactive to light.  Neck:     Thyroid: No thyromegaly.     Vascular: No JVD.     Trachea: No tracheal deviation.  Cardiovascular:     Rate and Rhythm: Normal rate and regular rhythm.     Heart sounds: Normal heart sounds. No murmur heard.   No friction rub. No gallop.  Pulmonary:     Effort: Pulmonary effort is normal. No respiratory distress.     Breath sounds: No wheezing or rales.  Chest:     Chest wall: No tenderness.  Abdominal:     General: Bowel sounds are normal.     Palpations: Abdomen is soft.  Musculoskeletal:        General: Normal range of motion.     Cervical back: Normal range of motion and neck supple.  Lymphadenopathy:     Cervical: No cervical adenopathy.  Skin:    General: Skin is warm and dry.  Neurological:     Mental Status: She is alert and oriented to person, place, and time.     Cranial Nerves: No cranial nerve deficit.  Psychiatric:        Behavior: Behavior normal.        Thought Content: Thought content normal.        Judgment: Judgment normal.       Assessment/Plan: 1. Hypersomnia Based on witnessed apneas, snoring, daytime fatigue, nocturnal hypoxia we will go ahead and order PSG to evaluate for possible  OSA as well as determine if continued need for nocturnal oxygen - PSG SLEEP STUDY  2. Nocturnal hypoxia Order PSG to evaluate for OSA as well as determine need for nocturnal oxygen - PSG SLEEP STUDY  3. Hyperlipidemia, mixed Continue Lipitor and will update labs - Lipid Panel With LDL/HDL Ratio  4. Dental abscess Start on Augmentin, patient will need to follow-up with dentist and discuss having teeth pulled as part of medical necessity - amoxicillin-clavulanate (AUGMENTIN) 875-125 MG tablet; Take 1 tablet by mouth 2 (two) times daily.  Dispense: 20 tablet; Refill: 0  5. B12 deficiency - B12 and Folate Panel  6. Vitamin D deficiency - VITAMIN D 25 Hydroxy (Vit-D Deficiency, Fractures)  7. Other fatigue - CBC w/Diff/Platelet - Comprehensive metabolic panel - Lipid Panel With LDL/HDL Ratio - TSH + free T4   General Counseling: Deshawn verbalizes understanding of the findings of todays visit and agrees with plan of treatment. I have discussed any further diagnostic evaluation that may be needed or ordered today. We also reviewed her medications today. she has been encouraged to call the office with any questions or concerns that should arise related to todays visit.    Orders Placed This Encounter  Procedures   CBC w/Diff/Platelet   Comprehensive metabolic panel   Lipid Panel With LDL/HDL Ratio   TSH + free T4   B12 and Folate Panel   VITAMIN D 25 Hydroxy (Vit-D Deficiency, Fractures)   PSG SLEEP STUDY    Meds ordered this encounter  Medications   amoxicillin-clavulanate (AUGMENTIN) 875-125 MG tablet    Sig: Take 1 tablet by mouth 2 (two) times daily.    Dispense:  20 tablet    Refill:  0    This patient was seen by Drema Dallas, PA-C in collaboration with Dr. Clayborn Bigness as a part of collaborative care agreement.  Total time spent:35 Minutes Time spent includes review of chart, medications, test results, and follow up plan with the patient.      Dr Lavera Guise Internal medicine

## 2021-08-13 NOTE — Telephone Encounter (Signed)
SS ordered. Printed. Gave to [employer]

## 2021-08-26 ENCOUNTER — Emergency Department: Payer: 59

## 2021-08-26 ENCOUNTER — Emergency Department
Admission: EM | Admit: 2021-08-26 | Discharge: 2021-08-26 | Disposition: A | Discharge status: Home / Self Care | Payer: 59 | Attending: Emergency Medicine | Admitting: Emergency Medicine

## 2021-08-26 ENCOUNTER — Other Ambulatory Visit: Payer: Self-pay

## 2021-08-26 DIAGNOSIS — Z955 Presence of coronary angioplasty implant and graft: Secondary | ICD-10-CM | POA: Diagnosis not present

## 2021-08-26 DIAGNOSIS — S63502A Unspecified sprain of left wrist, initial encounter: Secondary | ICD-10-CM | POA: Diagnosis not present

## 2021-08-26 DIAGNOSIS — E119 Type 2 diabetes mellitus without complications: Secondary | ICD-10-CM | POA: Diagnosis not present

## 2021-08-26 DIAGNOSIS — S60912A Unspecified superficial injury of left wrist, initial encounter: Secondary | ICD-10-CM | POA: Diagnosis present

## 2021-08-26 DIAGNOSIS — Z7982 Long term (current) use of aspirin: Secondary | ICD-10-CM | POA: Insufficient documentation

## 2021-08-26 DIAGNOSIS — Z7984 Long term (current) use of oral hypoglycemic drugs: Secondary | ICD-10-CM | POA: Diagnosis not present

## 2021-08-26 DIAGNOSIS — J449 Chronic obstructive pulmonary disease, unspecified: Secondary | ICD-10-CM | POA: Diagnosis not present

## 2021-08-26 DIAGNOSIS — Z8542 Personal history of malignant neoplasm of other parts of uterus: Secondary | ICD-10-CM | POA: Insufficient documentation

## 2021-08-26 DIAGNOSIS — S300XXA Contusion of lower back and pelvis, initial encounter: Secondary | ICD-10-CM

## 2021-08-26 DIAGNOSIS — S0990XA Unspecified injury of head, initial encounter: Secondary | ICD-10-CM | POA: Diagnosis not present

## 2021-08-26 MED ORDER — IBUPROFEN 800 MG PO TABS
800.0000 mg | ORAL_TABLET | Freq: Once | ORAL | Status: AC
  Administered 2021-08-26: 800 mg via ORAL
  Filled 2021-08-26: qty 1

## 2021-08-26 MED ORDER — ONDANSETRON 4 MG PO TBDP
4.0000 mg | ORAL_TABLET | Freq: Once | ORAL | Status: AC
  Administered 2021-08-26: 4 mg via ORAL
  Filled 2021-08-26: qty 1

## 2021-08-26 MED ORDER — ONDANSETRON 4 MG PO TBDP: 4.0000 mg | ORAL_TABLET | Freq: Four times a day (QID) | ORAL | 0 refills | Status: DC | PRN

## 2021-08-26 MED ORDER — IBUPROFEN 800 MG PO TABS: 800.0000 mg | ORAL_TABLET | Freq: Three times a day (TID) | ORAL | 0 refills | Status: DC | PRN

## 2021-08-26 MED ORDER — HYDROCODONE-ACETAMINOPHEN 5-325 MG PO TABS
1.0000 | ORAL_TABLET | Freq: Four times a day (QID) | ORAL | 0 refills | Status: DC | PRN
Start: 2021-08-26 — End: 2023-01-21

## 2021-08-26 MED ORDER — HYDROCODONE-ACETAMINOPHEN 5-325 MG PO TABS
1.0000 | ORAL_TABLET | Freq: Once | ORAL | Status: AC
  Administered 2021-08-26: 1 via ORAL
  Filled 2021-08-26: qty 1

## 2021-08-26 NOTE — Discharge Instructions (Signed)
You have had a head injury resulting in a concussion.  A concussion is a clinical diagnosis and not seen on imaging (CT, MRI).  Please avoid alcohol, sedatives for the next week.  Please rest and drink plenty of water.  We recommend that you avoid any activity that may lead to another head injury for at least 1 week or until your symptoms have completely resolved.  We also recommend "brain rest" to ensure the best possible long term outcomes - please avoid TV, cell phones, tablets, computers as much as possible for the next 48 hours.   ? ? ?You are being provided a prescription for opiates (also known as narcotics) for pain control.  Opiates can be addictive and should only be used when absolutely necessary for pain control when other alternatives do not work.  We recommend you only use them for the recommended amount of time and only as prescribed.  Please do not take with other sedative medications or alcohol.  Please do not drive, operate machinery, make important decisions while taking opiates.  Please note that these medications can be addictive and have high abuse potential.  Patients can become addicted to narcotics after only taking them for a few days.  Please keep these medications locked away from children, teenagers or any family members with history of substance abuse.  Narcotic pain medicine may also make you constipated.  You may use over-the-counter medications such as MiraLAX, Colace to prevent constipation.  If you become constipated, you may use over-the-counter enemas as needed.  Itching and nausea are also common side effects of narcotic pain medication.  If you develop uncontrolled vomiting or a rash, please stop these medications and seek medical care. ? ?
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
## 2021-08-29 ENCOUNTER — Ambulatory Visit
Admission: RE | Admit: 2021-08-29 | Discharge: 2021-08-29 | Disposition: A | Discharge status: Home / Self Care | Payer: 59 | Source: Ambulatory Visit | Attending: Nurse Practitioner | Admitting: Nurse Practitioner

## 2021-08-29 ENCOUNTER — Encounter: Payer: Self-pay | Admitting: Nurse Practitioner

## 2021-08-29 ENCOUNTER — Ambulatory Visit (INDEPENDENT_AMBULATORY_CARE_PROVIDER_SITE_OTHER): Payer: 59 | Admitting: Nurse Practitioner

## 2021-08-29 ENCOUNTER — Other Ambulatory Visit: Payer: Self-pay

## 2021-08-29 VITALS — BP 136/86 | HR 90 | Temp 98.7°F | Resp 16 | Ht 64.0 in | Wt 168.4 lb

## 2021-08-29 DIAGNOSIS — S060X1D Concussion with loss of consciousness of 30 minutes or less, subsequent encounter: Secondary | ICD-10-CM

## 2021-08-29 DIAGNOSIS — Q740 Other congenital malformations of upper limb(s), including shoulder girdle: Secondary | ICD-10-CM | POA: Insufficient documentation

## 2021-08-29 DIAGNOSIS — Z8781 Personal history of (healed) traumatic fracture: Secondary | ICD-10-CM | POA: Diagnosis not present

## 2021-08-29 DIAGNOSIS — M25552 Pain in left hip: Secondary | ICD-10-CM

## 2021-08-29 DIAGNOSIS — W19XXXD Unspecified fall, subsequent encounter: Secondary | ICD-10-CM

## 2021-08-29 DIAGNOSIS — W19XXXA Unspecified fall, initial encounter: Secondary | ICD-10-CM
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
## 2021-08-30 ENCOUNTER — Encounter: Payer: Self-pay | Admitting: Nurse Practitioner

## 2021-09-10 ENCOUNTER — Telehealth: Payer: Self-pay

## 2021-09-10 ENCOUNTER — Encounter: Payer: Self-pay | Admitting: Physician Assistant

## 2021-09-10 ENCOUNTER — Other Ambulatory Visit: Payer: Self-pay

## 2021-09-10 ENCOUNTER — Ambulatory Visit (INDEPENDENT_AMBULATORY_CARE_PROVIDER_SITE_OTHER): Payer: 59 | Admitting: Physician Assistant

## 2021-09-10 DIAGNOSIS — M25532 Pain in left wrist: Secondary | ICD-10-CM | POA: Diagnosis not present

## 2021-09-10 DIAGNOSIS — K047 Periapical abscess without sinus: Secondary | ICD-10-CM

## 2021-09-10 DIAGNOSIS — W19XXXD Unspecified fall, subsequent encounter: Secondary | ICD-10-CM

## 2021-09-10 NOTE — Telephone Encounter (Signed)
Patient being referral to dentist for medical issue. Unable to find dentist that will accept her insurance. Went onto [employer] website, they are not showing any dentist. I spoke with patient. She will call her insurance-Toni ?
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
## 2021-09-11 ENCOUNTER — Encounter (INDEPENDENT_AMBULATORY_CARE_PROVIDER_SITE_OTHER): Payer: 59 | Admitting: Internal Medicine

## 2021-09-11 DIAGNOSIS — G4719 Other hypersomnia: Secondary | ICD-10-CM | POA: Diagnosis not present

## 2021-09-14 NOTE — Procedures (Signed)
? ?[facility]  ?Polysomnogram Report ?Part I ? ? ?   ?                                                           Phone: [phone] ?Fax: [phone] ? ?Patient Name: [name] Acquisition Number: [id number]  ?Date of Birth: October 15, 1970 Acquisition Date: 09/11/2021  ?Referring Physician: Drema Dallas, PA-C    ? ?History: The patient is a 51 year old female who was referred for evaluation of possible sleep apnea with snoring and sleepiness. Medical History: back pain, cancer, COPD, headache, seizures, fdiabetes, B12 deficiency, vitamin D deficiency and hypersomnia. ? ?Medications: Augmentin, Accu-Chek Softclix Lancets, aspirin EC 81, Lipitor, glucose blood test strip, Duoneb, metformin, Dulera, Carafate and Stiolto Respimat. ? ?Procedure: This routine overnight polysomnogram was performed on the [facility] 5 using the standard diagnostic protocol. This included 6 channels of EEG, 2 channels of EOG, chin EMG, bilateral anterior tibialis EMG, nasal/oral thermistor, PTAF (nasal pressure transducer), chest and abdominal wall movements, EKG, and pulse oximetry. ? ?Description: The total recording time was 372.5 minutes. The total sleep time was 307.0 minutes. There were a total of 55.7 minutes of wakefulness after sleep onset for a reducedsleep efficiency of 82.4%. The latency to sleep onset was shortat 9.8 minutes. The R sleep onset latency wasprolonged at 163.0 minutes. Sleep parameters, as a percentage of the total sleep time, demonstrated 19.7% of sleep was in N1 sleep, 47.7% N2, 16.3% N3 and 16.3% R sleep. There were a total of 195 arousals for an arousal index of 38.1 arousals per hour of sleep that was elevated. ? ?Respiratory monitoring demonstrated nearly continuous moderate to severe snoring. Only 4 hypopneas were observed the entire study.  the baseline oxygen saturation during wakefulness was 97%, during NREM sleep averaged 96%, and during REM sleep averaged  94%. The total duration of oxygen <  90% was 0.2 minutes. ? ?Cardiac monitoring- There were no significant cardiac rhythm irregularities.  ? ?Periodic limb movement monitoring- did not demonstrate periodic limb movements.  ? ?Impression: ?This routine overnight polysomnogram did not demonstrate significant obstructive sleep apnea with only 4 hypopneas observed. Loud snoring was noted. The baseline oxygen saturation was 94-96% and supplemental oxygen was not utilized. ? ?There was a reduced sleep efficiency with anelevated arousal index,increased awakeningsand a reduced percentage of REM sleep. ? ? ?Recommendations:     ?Would recommend weight loss in a patient with a BMI of 28.8.  ? ? ? ?Allyne Gee, MD, FCCP ?Diplomate ABMS-Pulmonary, Critical Care and Sleep Medicine  ?Electronically reviewed and digitally signed ? ? ?[facility] ?Polysomnogram Report ?Part II ? ?Phone: [phone] ?Fax: [phone] ? ?Patient last name Hendler Neck Size 14.0 in. Acquisition [id number]  ?Patient first name [name] Weight 168.0 lbs. Started 09/11/2021 at 11:09:19 PM  ?Birth date 09-12-1970 Height 64.0 in. Stopped 09/12/2021 at 5:46:19 AM  ?Age 18 BMI 28.8 lb/in2 Duration 372.5  ?Study Type Adult      ?Report generated by Paulo Fruit., RPSGT Reviewed by: Richelle Ito. Saunders Glance, PhD, ABSM, FAASM ?Sleep Data: ?Lights Out: 11:26:01 PM Sleep Onset: 11:35:49 PM  ?Lights On: 5:38:31 AM Sleep Efficiency: 82.4 %  ?Total Recording Time: 372.5 min Sleep Latency (from Lights Off) 9.8 min  ?Total Sleep Time (TST): 307.0 min R  Latency (from Sleep Onset): 163.0 min  ?Sleep Period Time: 362.5 min Total number of awakenings: 24  ?Wake during sleep: 55.5 min Wake After Sleep Onset (WASO): 55.7 min  ? ?Sleep Data:         Arousal Summary: ?Stage  ?Latency from lights out (min) Latency from sleep onset (min) Duration (min) % Total Sleep Time  ?Normal values  ?N 1 9.8 0.0 60.5 19.7 (5%)  ?N 2 12.3 2.5 146.5 47.7 (50%)  ?N 3 27.3 17.5 50.0 16.3 (20%)  ?R 172.8 163.0 50.0 16.3 (25%)  ? ? Number  Index  ?Spontaneous 192 37.5  ?Apneas & Hypopneas 3 0.6  ?RERAs 1 0.2  ?     (Apneas & Hypopneas & RERAs)  (4) (0.8)  ?Limb Movement 0 0.0  ?Snore 0 0.0  ?TOTAL 196 38.3  ? ? ? ?Respiratory Data: ? CA OA MA Apnea Hypopnea* A+ H RERA Total  ?Number 0 0 0 0 '4 4 1 5  '$ ?Mean Dur (sec) 0.0 0.0 0.0 0.0 47.3 47.3 19.5 41.7  ?Max Dur (sec) 0.0 0.0 0.0 0.0 75.0 75.0 19.5 75.0  ?Total Dur (min) 0.0 0.0 0.0 0.0 3.1 3.1 0.3 3.5  ?% of TST 0.0 0.0 0.0 0.0 1.0 1.0 0.1 1.1  ?Index (#/h TST) 0.0 0.0 0.0 0.0 0.8 0.8 0.2 1.0  ?*Hypopneas scored based on 4% or greater desaturation. ? ?Sleep Stage:       ? REM NREM TST  ?AHI 3.6 0.2 0.8  ?RDI 4.8 0.2 1.0  ? ? ? ? ? ? ? ? ? ?Body Position Data: ? Sleep ?(min) TST ?(%) REM ?(min) NREM ?(min) CA ?(#) OA ?(#) MA ?(#) HYP ?(#) AHI ?(#/h) RERA ?(#) RDI ?(#/h) Desat ?(#)  ?Supine 141.5 46.09 35.0 106.5 0 0 0 3 1.3 1 1.7 8  ?Non-Supine 165.50 53.91 15.00 150.50 0.00 0.00 0.00 1.00 0.36 0 0.36 4.00  ?Left: 165.5 53.91 15.0 150.5 0 0 0 1 0.4 0 0.4 4  ?UP: 0.0 0.00 0.0 0.0 0 0 0 0 0.0 0 0.00 0  ?  ? ?Snoring: ?Total number of snoring episodes  0  ?Total time with snoring    min (   % of sleep)  ? ?Oximetry Distribution: ?            WK REM NREM TOTAL  ?Average (%)   97 94 96 96  ?< 90% 0.0 0.2 0.0 0.2  ?< 80% 0.0 0.0 0.0 0.0  ?< 70% 0.0 0.0 0.0 0.0  ?# of Desaturations* 0 0 5 5  ?Desat Index (#/hour) 0.0 0.0 1.2 1.0  ?Desat Max (%) 0 0 6 6  ?Desat Max Dur (sec) 0.0 0.0 72.0 72.0  ?Approx Min O2 during sleep 89  ?Approx min O2 during a respiratory event 90  ?Was Oxygen added (Y/N) and final rate No:   0 LPM  ?*Desaturations based on 4% or greater drop from baseline. ? ? Cheyne Stokes Breathing: None Present  ? ?Heart Rate Summary:  ?Average Heart Rate During Sleep 52.4 bpm      ?Highest Heart Rate During Sleep (95th %) 255.0 bpm (artifact)  ?Highest Heart Rate During Sleep 255 bpm (artifact)  ?Highest Heart Rate During Recording (TIB) 255 bpm (artifact)  ? ?Heart Rate Observations: ?Event Type #  Events   ?Bradycardia 0 Lowest HR Scored: N/A  ?Sinus Tachycardia During Sleep 0 Highest HR Scored: N/A  ?Narrow Complex Tachycardia 0 Highest HR Scored: N/A  ?Wide Complex Tachycardia 0  Highest HR Scored: N/A  ?Asystole 0 Longest Pause: N/A  ?Atrial Fibrillation 0 Duration Longest Event: N/A  ?Other Arrythmias  No Type:   ? ?Periodic Limb Movement Data: (Primary legs unless otherwise noted) ?Total # Limb Movement 0 Limb Movement Index 0.0  ?Total # PLMS    PLMS Index     ?Total # PLMS Arousals    PLMS Arousal Index     ?Percentage Sleep Time with PLMS   min (   % sleep)  ?Mean Duration limb movements (secs)     ? ? ? ? ? ?

## 2021-09-18 ENCOUNTER — Telehealth: Payer: Self-pay

## 2021-09-18 NOTE — Telephone Encounter (Signed)
Patient scheduled for PSG on 09/11/21 @ [facility]tat ?

## 2021-09-24 ENCOUNTER — Encounter: Payer: Self-pay | Admitting: Physician Assistant

## 2021-10-11 ENCOUNTER — Telehealth: Payer: Self-pay

## 2021-10-11 NOTE — Telephone Encounter (Signed)
Dental letter ready. Called patient to pick up. Letter at front desk-Toni ?

## 2021-10-15 ENCOUNTER — Encounter: Payer: 59 | Admitting: Physician Assistant

## 2021-10-15 ENCOUNTER — Ambulatory Visit: Payer: 59 | Admitting: Physician Assistant

## 2021-11-06 ENCOUNTER — Ambulatory Visit: Payer: 59 | Admitting: Internal Medicine

## 2021-11-12 ENCOUNTER — Telehealth: Payer: Self-pay

## 2021-11-12 NOTE — Telephone Encounter (Signed)
Left vm and sent mychart message to confirm 11/15/21 appointment-Toni

## 2021-11-15 ENCOUNTER — Encounter: Payer: 59 | Admitting: Physician Assistant

## 2022-04-22 ENCOUNTER — Encounter (INDEPENDENT_AMBULATORY_CARE_PROVIDER_SITE_OTHER): Payer: Self-pay

## 2022-07-23 ENCOUNTER — Ambulatory Visit (LOCAL_COMMUNITY_HEALTH_CENTER): Payer: Self-pay

## 2022-07-23 DIAGNOSIS — Z111 Encounter for screening for respiratory tuberculosis: Secondary | ICD-10-CM

## 2022-07-26 ENCOUNTER — Ambulatory Visit (LOCAL_COMMUNITY_HEALTH_CENTER): Payer: Self-pay

## 2022-07-26 DIAGNOSIS — Z111 Encounter for screening for respiratory tuberculosis: Secondary | ICD-10-CM

## 2022-07-26 LAB — TB SKIN TEST
Induration: 0 mm
TB Skin Test: NEGATIVE

## 2023-01-08 ENCOUNTER — Other Ambulatory Visit: Payer: Self-pay

## 2023-01-08 ENCOUNTER — Emergency Department: Payer: BC Managed Care – PPO

## 2023-01-08 ENCOUNTER — Emergency Department
Admission: EM | Admit: 2023-01-08 | Discharge: 2023-01-09 | Disposition: A | Discharge status: Home / Self Care | Payer: BC Managed Care – PPO | Attending: Emergency Medicine | Admitting: Emergency Medicine

## 2023-01-08 DIAGNOSIS — R1032 Left lower quadrant pain: Secondary | ICD-10-CM | POA: Insufficient documentation

## 2023-01-08 DIAGNOSIS — I2510 Atherosclerotic heart disease of native coronary artery without angina pectoris: Secondary | ICD-10-CM | POA: Diagnosis not present

## 2023-01-08 DIAGNOSIS — J449 Chronic obstructive pulmonary disease, unspecified: Secondary | ICD-10-CM | POA: Diagnosis not present

## 2023-01-08 DIAGNOSIS — I10 Essential (primary) hypertension: Secondary | ICD-10-CM | POA: Insufficient documentation

## 2023-01-08 DIAGNOSIS — K625 Hemorrhage of anus and rectum: Secondary | ICD-10-CM | POA: Diagnosis not present

## 2023-01-08 LAB — URINALYSIS, ROUTINE W REFLEX MICROSCOPIC
Bilirubin Urine: NEGATIVE
Glucose, UA: NEGATIVE mg/dL
Hgb urine dipstick: NEGATIVE
Ketones, ur: NEGATIVE mg/dL
Leukocytes,Ua: NEGATIVE
Nitrite: NEGATIVE
Protein, ur: NEGATIVE mg/dL
Specific Gravity, Urine: 1.002 — ABNORMAL LOW (ref 1.005–1.030)
pH: 7 (ref 5.0–8.0)

## 2023-01-08 LAB — COMPREHENSIVE METABOLIC PANEL
ALT: 12 U/L (ref 0–44)
AST: 17 U/L (ref 15–41)
Albumin: 3.6 g/dL (ref 3.5–5.0)
Alkaline Phosphatase: 81 U/L (ref 38–126)
Anion gap: 10 (ref 5–15)
BUN: <5 mg/dL — ABNORMAL LOW (ref 6–20)
CO2: 22 mmol/L (ref 22–32)
Calcium: 8.9 mg/dL (ref 8.9–10.3)
Chloride: 102 mmol/L (ref 98–111)
Creatinine, Ser: 0.77 mg/dL (ref 0.44–1.00)
GFR, Estimated: >60 mL/min (ref >60)
Glucose, Bld: 160 mg/dL — ABNORMAL HIGH (ref 70–99)
Potassium: 3.4 mmol/L — ABNORMAL LOW (ref 3.5–5.1)
Sodium: 134 mmol/L — ABNORMAL LOW (ref 135–145)
Total Bilirubin: 0.4 mg/dL (ref 0.3–1.2)
Total Protein: 6.7 g/dL (ref 6.5–8.1)

## 2023-01-08 LAB — CBC WITH DIFFERENTIAL/PLATELET
Abs Immature Granulocytes: 0.07 10*3/uL (ref 0.00–0.07)
Basophils Absolute: 0.1 10*3/uL (ref 0.0–0.1)
Basophils Relative: 1 %
Eosinophils Absolute: 0.1 10*3/uL (ref 0.0–0.5)
Eosinophils Relative: 2 %
HCT: 36.4 % (ref 36.0–46.0)
Hemoglobin: 12.8 g/dL (ref 12.0–15.0)
Immature Granulocytes: 1 %
Lymphocytes Relative: 47 %
Lymphs Abs: 4.3 10*3/uL — ABNORMAL HIGH (ref 0.7–4.0)
MCH: 32.1 pg (ref 26.0–34.0)
MCHC: 35.2 g/dL (ref 30.0–36.0)
MCV: 91.2 fL (ref 80.0–100.0)
Monocytes Absolute: 0.6 10*3/uL (ref 0.1–1.0)
Monocytes Relative: 7 %
Neutro Abs: 3.8 10*3/uL (ref 1.7–7.7)
Neutrophils Relative %: 42 %
Platelets: 360 10*3/uL (ref 150–400)
RBC: 3.99 MIL/uL (ref 3.87–5.11)
RDW: 13.1 % (ref 11.5–15.5)
WBC: 9 10*3/uL (ref 4.0–10.5)
nRBC: 0 % (ref 0.0–0.2)

## 2023-01-08 LAB — LIPASE, BLOOD: Lipase: 46 U/L (ref 11–51)

## 2023-01-08 NOTE — ED Triage Notes (Signed)
Pt presents to ER with c/o rectal bleeding that started a few days ago.  Pt also states that she has been vomiting any time she eats greasy foods.  Pt reports there has consistently been blood clots when using restroom, but states she also notices it when she urinates.  Pt endorses some upper abd pain across the top of her abdomen.  Pt denies taking any blood thinners, or NSAID usage.  Pt otherwise A&O x4 and in NAD.

## 2023-01-08 NOTE — ED Provider Notes (Signed)
[facility] Provider Note    Event Date/Time   First MD Initiated Contact with Patient 01/08/23 2304     (approximate)   History   Chief Complaint Rectal Bleeding   HPI  [name] is a 52 y.o. female with past medical history of hypertension, CAD, COPD, and hemorrhoids who presents to the ED complaining of rectal bleeding.  Patient reports that yesterday evening she began to notice bright red blood whenever she sat down to have a bowel movement or wiped.  She has passed a few small clots and reports some rectal discomfort, describes bleeding as similar to prior issues with hemorrhoids.  She does describe some increasing pain in the left lower quadrant of her abdomen, has not had any fevers, nausea, or vomiting.  She also denies any dysuria or flank pain.  She is status post hysterectomy.     Physical Exam   Triage Vital Signs: ED Triage Vitals  Encounter Vitals Group     BP 01/08/23 2134 (!) 148/99     Systolic BP Percentile --      Diastolic BP Percentile --      Pulse Rate 01/08/23 2134 (!) 105     Resp 01/08/23 2134 18     Temp 01/08/23 2134 98.5 F (36.9 C)     Temp Source 01/08/23 2134 Oral     SpO2 01/08/23 2134 97 %     Weight 01/08/23 2136 165 lb (74.8 kg)     Height 01/08/23 2136 5\' 4"  (1.626 m)     Head Circumference --      Peak Flow --      Pain Score 01/08/23 2136 8     Pain Loc --      Pain Education --      Exclude from Growth Chart --     Most recent vital signs: Vitals:   01/08/23 2134  BP: (!) 148/99  Pulse: (!) 105  Resp: 18  Temp: 98.5 F (36.9 C)  SpO2: 97%    Constitutional: Alert and oriented. Eyes: Conjunctivae are normal. Head: Atraumatic. Nose: No congestion/rhinnorhea. Mouth/Throat: Mucous membranes are moist.  Cardiovascular: Normal rate, regular rhythm. Grossly normal heart sounds.  2+ radial pulses bilaterally. Respiratory: Normal respiratory effort.  No retractions. Lungs  CTAB. Gastrointestinal: Soft and tender to palpation in the left lower quadrant with no rebound or guarding. No distention.  Rectal exam with hemorrhoids, no active bleeding noted. Musculoskeletal: No lower extremity tenderness nor edema.  Neurologic:  Normal speech and language. No gross focal neurologic deficits are appreciated.    ED Results / Procedures / Treatments   Labs (all labs ordered are listed, but only abnormal results are displayed) Labs Reviewed  CBC WITH DIFFERENTIAL/PLATELET - Abnormal; Notable for the following components:      Result Value   Lymphs Abs 4.3 (*)    All other components within normal limits  COMPREHENSIVE METABOLIC PANEL - Abnormal; Notable for the following components:   Sodium 134 (*)    Potassium 3.4 (*)    Glucose, Bld 160 (*)    BUN <5 (*)    All other components within normal limits  URINALYSIS, ROUTINE W REFLEX MICROSCOPIC - Abnormal; Notable for the following components:   Color, Urine STRAW (*)    APPearance CLEAR (*)    Specific Gravity, Urine 1.002 (*)    All other components within normal limits  LIPASE, BLOOD   RADIOLOGY CT abdomen/pelvis reviewed and interpreted by me with  no inflammatory changes, focal fluid collections, or dilated bowel loops.  PROCEDURES:  Critical Care performed: No  Procedures   MEDICATIONS ORDERED IN ED: Medications - No data to display   IMPRESSION / MDM / ASSESSMENT AND PLAN / ED COURSE  I reviewed the triage vital signs and the nursing notes.                              52 y.o. female with past medical history of hypertension, CAD, COPD, and hemorrhoids who presents to the ED with rectal bleeding since yesterday evening along with left lower quadrant abdominal pain.  Patient's presentation is most consistent with acute presentation with potential threat to life or bodily function.  Differential diagnosis includes, but is not limited to, diverticulitis, colitis, lower GI bleed, hemorrhoids,  anemia, electrolyte abnormality, AKI, UTI.  Patient nontoxic-appearing and in no acute distress, vital signs are unremarkable.  Her abdomen is soft but she does have focal tenderness in the left lower quadrant of her abdomen.  Rectal exam with hemorrhoids but no active bleeding noted, stool is light brown and guaiac negative.  Labs are reassuring with no significant anemia, leukocytosis, tract abnormality, or AKI.  LFTs and lipase are also unremarkable, urinalysis pending at this time.  Bleeding seems most likely to be coming from her hemorrhoids, but given left lower quadrant tenderness we will further assess with CT imaging.  Patient declines pain or nausea medication, plan to reassess following imaging.  CT imaging is negative for acute pathology in the left lower quadrant, does show 6 mm hyperdensity near the distal CBD, question choledocholithiasis.  This seems unlikely given patient has no pain in her upper abdomen and LFTs are all within normal limits.  Do not feel MRCP warranted at this time, patient appropriate for outpatient follow-up of her rectal bleeding likely due to hemorrhoids.  Referral provided to general surgery and we will start patient on Anusol and Colace.  She was counseled to return to the ED for new or worsening symptoms, patient agrees with plan.      FINAL CLINICAL IMPRESSION(S) / ED DIAGNOSES   Final diagnoses:  Rectal bleeding  Left lower quadrant abdominal pain     Rx / DC Orders   ED Discharge Orders          Ordered    hydrocortisone (ANUSOL-HC) 25 MG suppository  Every 12 hours        01/09/23 0025    docusate sodium (COLACE) 100 MG capsule  2 times daily        01/09/23 0025             Note:  This document was prepared using Dragon voice recognition software and may include unintentional dictation errors.   Chesley Noon, MD 01/09/23 [phone]

## 2023-01-09 MED ORDER — HYDROCORTISONE ACETATE 25 MG RE SUPP: 25.0000 mg | Freq: Two times a day (BID) | RECTAL | 1 refills | Status: DC

## 2023-01-09 MED ORDER — DOCUSATE SODIUM 100 MG PO CAPS: 100.0000 mg | ORAL_CAPSULE | Freq: Two times a day (BID) | ORAL | 2 refills | Status: DC

## 2023-01-09 NOTE — ED Notes (Signed)
Discharge instructions provided by edp were discussed with pt. Pt verbalized understanding with no additional questions at this time. Pt to follow up with gen surg

## 2023-01-21 ENCOUNTER — Ambulatory Visit: Payer: BC Managed Care – PPO | Admitting: Surgery

## 2023-01-21 ENCOUNTER — Encounter: Payer: Self-pay | Admitting: Surgery

## 2023-01-21 VITALS — BP 124/89 | HR 93 | Temp 98.6°F | Ht 64.0 in | Wt 141.6 lb

## 2023-01-21 DIAGNOSIS — K5909 Other constipation: Secondary | ICD-10-CM

## 2023-01-21 DIAGNOSIS — K828 Other specified diseases of gallbladder: Secondary | ICD-10-CM | POA: Diagnosis not present

## 2023-01-21 DIAGNOSIS — R935 Abnormal findings on diagnostic imaging of other abdominal regions, including retroperitoneum: Secondary | ICD-10-CM | POA: Diagnosis not present

## 2023-01-21 MED ORDER — POLYETHYLENE GLYCOL 3350 17 GM/SCOOP PO POWD: ORAL | 0 refills | Status: DC

## 2023-01-21 NOTE — Progress Notes (Signed)
Patient ID: [name], female   DOB: Sep 12, 1970, 52 y.o.   MRN: [id number]  Chief Complaint: Referral from the ED for rectal bleeding  History of Present Illness [name] is a 52 y.o. female with history of rectal bleeding, presented to the ED July 17.  Reported having longstanding constipation, may go weeks, even months without bowel movement.  Reportedly had prior colonoscopy with inadequate prep, was reportedly normal.  Also had abnormality on her last CT scan examination suggesting a common bile duct stone.  She has no history of epigastric or right upper quadrant pain.  She reports some left lower quadrant pain.  She gets nausea and has vomiting if she eats sufficiently.  She tends to eat very small amounts and spreads it out.  Denies fevers and chills, has not seen any rectal bleeding since she has not had a bowel movement in the last 2 weeks.   Past Medical History Past Medical History:  Diagnosis Date   Back pain    Cancer (HCC)    + CANCER CELLS TO UTERUS   COPD (chronic obstructive pulmonary disease) (HCC)    Diabetes mellitus without complication (HCC)    Family history of adverse reaction to anesthesia    FATHER STOPS BREATHING WITH ANESTHESIA   Headache    HX MIGRAINES   Seizures (HCC)    1 SEIZURE  VERY STRESSED      Past Surgical History:  Procedure Laterality Date   ABDOMINAL HYSTERECTOMY     APPENDECTOMY     BACK SURGERY  2016   COLONOSCOPY WITH PROPOFOL N/A 06/01/2019   Procedure: COLONOSCOPY WITH PROPOFOL;  Surgeon: Wyline Mood, MD;  Location: [facility] ENDOSCOPY;  Service: Gastroenterology;  Laterality: N/A;   LEFT HEART CATH AND CORONARY ANGIOGRAPHY Left 03/24/2020   Procedure: LEFT HEART CATH AND CORONARY ANGIOGRAPHY;  Surgeon: Lamar Blinks, MD;  Location: [facility] INVASIVE CV LAB;  Service: Cardiovascular;  Laterality: Left;    Allergies  Allergen Reactions   Chlorhexidine Itching   Ivp Dye [Iodinated Contrast Media]     Clindamycin/Lincomycin Rash   Demerol [Meperidine] Rash   Elemental Sulfur Rash   Morphine And Codeine Hives and Rash    Current Outpatient Medications  Medication Sig Dispense Refill   hydrocortisone (ANUSOL-HC) 25 MG suppository Place 1 suppository (25 mg total) rectally every 12 (twelve) hours. 12 suppository 1   ibuprofen (ADVIL) 800 MG tablet Take 1 tablet (800 mg total) by mouth every 8 (eight) hours as needed for mild pain. 30 tablet 0   Accu-Chek Softclix Lancets lancets Use as instructed once a daily DX E11.65 (Patient not taking: Reported on 01/21/2023) 100 each 1   aspirin EC 81 MG tablet Take 81 mg by mouth daily. Swallow whole. (Patient not taking: Reported on 01/21/2023)     atorvastatin (LIPITOR) 10 MG tablet Take 1 tablet (10 mg total) by mouth daily. (Patient not taking: Reported on 01/21/2023) 90 tablet 3   Blood Glucose Monitoring Suppl (ACCU-CHEK GUIDE ME) w/Device KIT Use as directed DX E11.65 (Patient not taking: Reported on 01/21/2023) 1 kit 0   glucose blood (ACCU-CHEK GUIDE) test strip Use as directed once a daily DX E11.65 (Patient not taking: Reported on 01/21/2023) 100 each 1   ipratropium-albuterol (DUONEB) 0.5-2.5 (3) MG/3ML SOLN Take 3 mLs by nebulization every 6 (six) hours as needed. (Patient not taking: Reported on 01/21/2023) 360 mL 1   metFORMIN (GLUCOPHAGE) 500 MG tablet Take 1 tablet (500 mg total) by mouth daily with  breakfast. (Patient not taking: Reported on 01/21/2023) 90 tablet 1   mometasone-formoterol (DULERA) 200-5 MCG/ACT AERO Inhale 2 puffs into the lungs 2 (two) times daily. (Patient not taking: Reported on 01/21/2023) 1 each 3   ondansetron (ZOFRAN-ODT) 4 MG disintegrating tablet Take 1 tablet (4 mg total) by mouth every 6 (six) hours as needed for nausea or vomiting. (Patient not taking: Reported on 01/21/2023) 20 tablet 0   sucralfate (CARAFATE) 1 g tablet Take 1 tablet (1 g total) by mouth 4 (four) times daily as needed (for abdominal discomfort, nausea,  and/or vomiting). (Patient not taking: Reported on 01/21/2023) 30 tablet 1   Tiotropium Bromide-Olodaterol (STIOLTO RESPIMAT) 2.5-2.5 MCG/ACT AERS Inhale 2 puffs into the lungs daily. (Patient not taking: Reported on 01/21/2023) 1 each 4   No current facility-administered medications for this visit.    Family History Family History  Problem Relation Age of Onset   Heart attack Father    Lupus Sister    Breast cancer Maternal Grandmother 41   Lupus Maternal Grandfather       Social History Social History   Tobacco Use   Smoking status: Light Smoker    Current packs/day: 0.50    Average packs/day: 0.5 packs/day for 28.0 years (14.0 ttl pk-yrs)    Types: Cigarettes   Smokeless tobacco: Never   Tobacco comments:    Half a pack  Vaping Use   Vaping status: Never Used  Substance Use Topics   Alcohol use: No    Alcohol/week: 0.0 standard drinks of alcohol   Drug use: No        Review of Systems  Constitutional:  Positive for chills. Negative for fever.  HENT: Negative.    Eyes: Negative.   Respiratory: Negative.    Cardiovascular: Negative.   Gastrointestinal:  Positive for blood in stool, constipation, nausea and vomiting.  Musculoskeletal:  Positive for back pain.  Skin: Negative.   Neurological: Negative.   Psychiatric/Behavioral: Negative.       Physical Exam Blood pressure 124/89, pulse 93, temperature 98.6 F (37 C), temperature source Oral, height 5\' 4"  (1.626 m), weight 141 lb 9.6 oz (64.2 kg), SpO2 99%. Last Weight  Most recent update: 01/21/2023 10:27 AM    Weight  64.2 kg (141 lb 9.6 oz)             CONSTITUTIONAL: Well developed, and nourished, appropriately responsive and aware without distress.   EYES: Sclera non-icteric.   EARS, NOSE, MOUTH AND THROAT:  The oropharynx is clear. Oral mucosa is pink and moist.    Hearing is intact to voice.  NECK: Trachea is midline, and there is no jugular venous distension.  LYMPH NODES:  Lymph nodes in the neck  are not appreciated. RESPIRATORY:  Lungs are clear, and breath sounds are equal bilaterally.  Normal respiratory effort without pathologic use of accessory muscles. CARDIOVASCULAR: Heart is regular in rate and rhythm.   Well perfused.  GI: The abdomen is soft, nontender, and nondistended. There were no palpable masses.  I did not appreciate hepatosplenomegaly.  GU: Undrea present as chaperone.  DRE completed, no significant internal or external hemorrhoids present.  There is no evidence of any fissure, sentinel tag or tenderness on anal exam.  The rectal vault was essentially empty of any formed stool.  There was some soft claylike stool on the glove upon retrieval.  But this was not palpable during exam.  No distal rectal masses or polyps are appreciated. MUSCULOSKELETAL:  Symmetrical muscle tone appreciated  in all four extremities.    SKIN: Skin turgor is normal. No pathologic skin lesions appreciated.  NEUROLOGIC:  Motor and sensation appear grossly normal.  Cranial nerves are grossly without defect. PSYCH:  Alert and oriented to person, place and time. Affect is appropriate for situation.  Data Reviewed I have personally reviewed what is currently available of the patient's imaging, recent labs and medical records.   Labs:     Latest Ref Rng & Units 01/08/2023    9:38 PM 06/21/2021    7:45 PM 12/20/2020    9:36 PM  CBC  WBC 4.0 - 10.5 K/uL 9.0  13.0  10.8   Hemoglobin 12.0 - 15.0 g/dL 86.5  78.4  69.6   Hematocrit 36.0 - 46.0 % 36.4  39.9  38.6   Platelets 150 - 400 K/uL 360  311  281       Latest Ref Rng & Units 01/08/2023    9:38 PM 06/21/2021    7:45 PM 12/20/2020    9:36 PM  CMP  Glucose 70 - 99 mg/dL 295  284  132   BUN 6 - 20 mg/dL 5  11  9    Creatinine 0.44 - 1.00 mg/dL 4.40  1.02  7.25   Sodium 135 - 145 mmol/L 134  133  135   Potassium 3.5 - 5.1 mmol/L 3.4  4.3  3.4   Chloride 98 - 111 mmol/L 102  101  105   CO2 22 - 32 mmol/L 22  22  23    Calcium 8.9 - 10.3 mg/dL 8.9   9.5  8.9   Total Protein 6.5 - 8.1 g/dL 6.7  7.2  7.0   Total Bilirubin 0.3 - 1.2 mg/dL 0.4  0.4  0.5   Alkaline Phos 38 - 126 U/L 81  83  73   AST 15 - 41 U/L 17  17  17    ALT 0 - 44 U/L 12  13  12        Imaging: Radiological images reviewed:  CT scan examination from January 08, 2023 reviewed the images personally, and concur with report.  No evidence of appreciable constipation on that CT, question of distal common bile duct abnormality as in image below:  Within last 24 hrs: No results found.  Assessment    Abnormal CT scan of distal common bile duct, with normal LFTs. Chronic constipation.  Patient Active Problem List   Diagnosis Date Noted   Benign essential HTN 04/24/2020   Coronary artery disease involving native coronary artery of native heart 04/24/2020   Hyperlipidemia, mixed 04/24/2020   Angina pectoris (HCC) 02/29/2020   COPD (chronic obstructive pulmonary disease) with chronic bronchitis 01/31/2020   Atherosclerosis of abdominal aorta (HCC) 09/10/2019   Grade I hemorrhoids 07/27/2019   Degenerative disc disease, lumbar 08/24/2015    Plan    Gave instructions for bowel prep regimen for colonoscopy/surgery.  Advised to utilize this with clear liquid diet for 2 days, and then utilize MiraLAX 3 times daily consistently thereafter.  Will obtain ultrasound to evaluate biliary tract.  Consider MRI/MRCP if CT scan findings show some validity with follow-up ultrasound.  Will follow-up in 2 weeks or as needed.  Face-to-face time spent with the patient and accompanying care providers(if present) was 45 minutes, with more than 50% of the time spent counseling, educating, and coordinating care of the patient.    These notes generated with voice recognition software. I apologize for typographical errors.  Campbell Lerner M.D., FACS 01/21/2023, 10:37  AM

## 2023-01-21 NOTE — Patient Instructions (Addendum)
Your Ultrasound is scheduled for 01/23/2023 11 am (arrive by 10:45) at [facility] on [address]. Nothing to eat or drink after Midnight.   Maintain hydration by drinking small amounts of clear fluids frequently, then soft diet, and then advance diet as tolerated. May use OTC Imodium if desired for any diarrhea.  Call if symptoms worsen, high fever, severe weakness or fainting, increased abdominal pain, blood in stool or vomit, or failure to improve in 2-3 days.   If you have any concerns or questions, please feel free to call our office.   Constipation, Adult  Constipation is when a person has trouble pooping (having a bowel movement). When you have this condition, you may poop fewer than 3 times a week. Your poop (stool) may also be dry, hard, or bigger than normal. Follow these instructions at home: Eating and drinking  Eat foods that have a lot of fiber, such as: Fresh fruits and vegetables. Whole grains. Beans. Eat less of foods that are low in fiber and high in fat and sugar, such as: [place] fries. Hamburgers. Cookies. Candy. Soda. Drink enough fluid to keep your pee (urine) pale yellow. General instructions Exercise regularly or as told by your doctor. Try to do 150 minutes of exercise each week. Go to the restroom when you feel like you need to poop. Do not hold it in. Take over-the-counter and prescription medicines only as told by your doctor. These include any fiber supplements. When you poop: Do deep breathing while relaxing your lower belly (abdomen). Relax your pelvic floor. The pelvic floor is a group of muscles that support the rectum, bladder, and intestines (as well as the uterus in women). Watch your condition for any changes. Tell your doctor if you notice any. Keep all follow-up visits as told by your doctor. This is important. Contact a doctor if: You have pain that gets worse. You have a fever. You have not pooped for 4 days. You vomit. You are  not hungry. You lose weight. You are bleeding from the opening of the butt (anus). You have thin, pencil-like poop. Get help right away if: You have a fever, and your symptoms suddenly get worse. You leak poop or have blood in your poop. Your belly feels hard or bigger than normal (bloated). You have very bad belly pain. You feel dizzy or you faint. Summary Constipation is when a person poops fewer than 3 times a week, has trouble pooping, or has poop that is dry, hard, or bigger than normal. Eat foods that have a lot of fiber. Drink enough fluid to keep your pee (urine) pale yellow. Take over-the-counter and prescription medicines only as told by your doctor. These include any fiber supplements. This information is not intended to replace advice given to you by your health care provider. Make sure you discuss any questions you have with your health care provider. Document Revised: 04/24/2022 Document Reviewed: 04/24/2022 Elsevier Patient Education  2024 [employer] Inc[employer]   Rectal Bleeding  Rectal bleeding is when blood comes out of the opening of the butt (anus). You may see bright red blood in your underwear or in the toilet after you poop (have a bowel movement). You may also have blood mixed with your poop (stool), or dark red or black poop. Rectal bleeding is often a sign that something is wrong. It can be caused by many things. It needs to be checked by a doctor. Follow these instructions at home: Medicines Take over-the-counter and prescription medicines only as  told by your doctor. Ask your doctor about changing or stopping your normal medicines. These include blood thinners. Managing constipation Your condition may cause trouble pooping (constipation). To prevent or treat this, or to help make your poop soft, you may need to: Drink enough fluid to keep your pee (urine) pale yellow. Take over-the-counter or prescription medicines. Eat foods that are high in fiber. These include  beans, whole grains, and fresh fruits and vegetables. Limit foods that are high in fat and sugar. These include fried or sweet foods.  General instructions Try not to strain when you poop. Take a warm bath. This may help with pain. Watch for changes in your symptoms. Contact a doctor if: You have pain or swelling in your belly (abdomen). You have a fever. You feel weak or like you may vomit. You cannot poop. You have new or more bleeding. You have black or dark red poop. You vomit blood or something that looks like coffee grounds. Get help right away if: You faint. You have very bad pain in your butt. These symptoms may be an emergency. Get help right away. Call 911. Do not wait to see if the symptoms will go away. Do not drive yourself to the hospital. This information is not intended to replace advice given to you by your health care provider. Make sure you discuss any questions you have with your health care provider. Document Revised: 01/29/2022 Document Reviewed: 01/29/2022 Elsevier Patient Education  2024 [employer].

## 2023-01-23 ENCOUNTER — Ambulatory Visit
Admission: RE | Admit: 2023-01-23 | Discharge: 2023-01-23 | Disposition: A | Discharge status: Home / Self Care | Payer: BC Managed Care – PPO | Source: Ambulatory Visit | Attending: Surgery | Admitting: Surgery

## 2023-01-23 DIAGNOSIS — K828 Other specified diseases of gallbladder: Secondary | ICD-10-CM | POA: Insufficient documentation

## 2023-01-30 ENCOUNTER — Ambulatory Visit: Payer: BC Managed Care – PPO | Admitting: Surgery

## 2023-02-06 ENCOUNTER — Ambulatory Visit: Payer: BC Managed Care – PPO | Admitting: Surgery

## 2023-02-06 ENCOUNTER — Telehealth: Payer: Self-pay

## 2023-02-06 ENCOUNTER — Encounter: Payer: Self-pay | Admitting: Surgery

## 2023-02-06 VITALS — BP 110/77 | HR 93 | Temp 98.0°F | Ht 64.0 in | Wt 140.8 lb

## 2023-02-06 DIAGNOSIS — R935 Abnormal findings on diagnostic imaging of other abdominal regions, including retroperitoneum: Secondary | ICD-10-CM

## 2023-02-06 DIAGNOSIS — K828 Other specified diseases of gallbladder: Secondary | ICD-10-CM

## 2023-02-06 DIAGNOSIS — K5909 Other constipation: Secondary | ICD-10-CM | POA: Diagnosis not present

## 2023-02-06 NOTE — Patient Instructions (Addendum)
You may mix the Miralax with any drink that you like. You may take the Miralax twice a day if needed.   You should continue with a fiber supplement daily.  Continue to drink plenty of fluids.  We will get you scheduled for an MRCP exam. We will call you with your results.   You are overdue for your colonoscopy, please call them to get this scheduled. Dr Tobi Bastos, [phone].   You are scheduled for a MRCP at [facility] on 02/19/23. [facility] 7:30 am. You may not have any food or drinks for 4 hours prior.

## 2023-02-06 NOTE — Telephone Encounter (Signed)
Patient left a message that she is calling to schedule her repeat colonoscopy with our office. Last office visit was 02/07/2021 and in the office visit note Dr. Allegra Lai stated Patient had a colonoscopy with adequate prep in 05/2019 Solitary subcentimeter sessile serrated polyp on pathology Recommend surveillance colonoscopy in 05/2024 and every 5 years minimum given family history of colon cancer in patient's father at age 55   Return patient call and informed her that Dr. Allegra Lai document in her office visit not she was not due till 05/2024 but I would check with her to see what she recommended. She states her mychart is tell her she is past due for it.  Last colonoscopy was with Dr. Tobi Bastos on 06/01/2019 and his letter from 06/10/2019 said to repeat in 3 years. Please advise.

## 2023-02-06 NOTE — Progress Notes (Signed)
Patient ID: [name], female   DOB: 20-Jun-1971, 52 y.o.   MRN: [id number]  Chief Complaint: Referral from the ED for rectal bleeding  History of Present Illness Presents today in follow-up after prescribed bowel regimen, and ultrasound examination of right upper quadrant.  She reports today she is continuing to have no abdominal pain.  Pleased to know her ultrasound was negative for cholelithiasis or gallbladder issues.  She is moving her bowels typically once daily but not every day.  Does not like the MiraLAX but she continues to take it.  Typical bowel movement is shortly after lunch which is her biggest meal of the day.  Her bowels do move much easier and without pain.  She sees minimal amounts of blood in her stool.  She is due for follow-up colonoscopy after a history of polypectomy.  Initially referred: [name] is a 52 y.o. female with history of rectal bleeding, presented to the ED July 17.  Reported having longstanding constipation, may go weeks, even months without bowel movement.  Reportedly had prior colonoscopy with inadequate prep, was reportedly normal.  Also had abnormality on her last CT scan examination suggesting a common bile duct stone.  She has no history of epigastric or right upper quadrant pain.  She reports some left lower quadrant pain.  She gets nausea and has vomiting if she eats sufficiently.  She tends to eat very small amounts and spreads it out.  Denies fevers and chills, has not seen any rectal bleeding since she has not had a bowel movement in the last 2 weeks.   Past Medical History Past Medical History:  Diagnosis Date   Back pain    Cancer (HCC)    + CANCER CELLS TO UTERUS   COPD (chronic obstructive pulmonary disease) (HCC)    Family history of adverse reaction to anesthesia    FATHER STOPS BREATHING WITH ANESTHESIA   Headache    HX MIGRAINES   Seizures (HCC)    1 SEIZURE  VERY STRESSED      Past Surgical History:  Procedure  Laterality Date   ABDOMINAL HYSTERECTOMY     APPENDECTOMY     BACK SURGERY  2016   COLONOSCOPY WITH PROPOFOL N/A 06/01/2019   Procedure: COLONOSCOPY WITH PROPOFOL;  Surgeon: Wyline Mood, MD;  Location: [facility] ENDOSCOPY;  Service: Gastroenterology;  Laterality: N/A;   LEFT HEART CATH AND CORONARY ANGIOGRAPHY Left 03/24/2020   Procedure: LEFT HEART CATH AND CORONARY ANGIOGRAPHY;  Surgeon: Lamar Blinks, MD;  Location: [facility] INVASIVE CV LAB;  Service: Cardiovascular;  Laterality: Left;    Allergies  Allergen Reactions   Chlorhexidine Itching   Ivp Dye [Iodinated Contrast Media]    Clindamycin/Lincomycin Rash   Demerol [Meperidine] Rash   Elemental Sulfur Rash   Morphine And Codeine Hives and Rash    Current Outpatient Medications  Medication Sig Dispense Refill   hydrocortisone (ANUSOL-HC) 25 MG suppository Place 1 suppository (25 mg total) rectally every 12 (twelve) hours. 12 suppository 1   ibuprofen (ADVIL) 800 MG tablet Take 1 tablet (800 mg total) by mouth every 8 (eight) hours as needed for mild pain. 30 tablet 0   polyethylene glycol powder (MIRALAX) 17 GM/SCOOP powder Mix with 64 ounces of Gatorade (no red) the day prior to surgery and drink as directed. 238 g 0   No current facility-administered medications for this visit.    Family History Family History  Problem Relation Age of Onset   Heart attack Father  Lupus Sister    Breast cancer Maternal Grandmother 58   Lupus Maternal Grandfather       Social History Social History   Tobacco Use   Smoking status: Light Smoker    Current packs/day: 0.50    Average packs/day: 0.5 packs/day for 28.0 years (14.0 ttl pk-yrs)    Types: Cigarettes    Passive exposure: Past   Smokeless tobacco: Never   Tobacco comments:    Half a pack  Vaping Use   Vaping status: Never Used  Substance Use Topics   Alcohol use: No    Alcohol/week: 0.0 standard drinks of alcohol   Drug use: No        Review of Systems   Constitutional:  Positive for chills. Negative for fever.  HENT: Negative.    Eyes: Negative.   Respiratory: Negative.    Cardiovascular: Negative.   Gastrointestinal:  Positive for blood in stool, constipation, nausea and vomiting.  Musculoskeletal:  Positive for back pain.  Skin: Negative.   Neurological: Negative.   Psychiatric/Behavioral: Negative.       Physical Exam Blood pressure 110/77, pulse 93, temperature 98 F (36.7 C), height 5\' 4"  (1.626 m), weight 140 lb 12.8 oz (63.9 kg), SpO2 98%. Last Weight  Most recent update: 02/06/2023  9:23 AM    Weight  63.9 kg (140 lb 12.8 oz)              CONSTITUTIONAL: Well developed, and nourished, appropriately responsive and aware without distress.   EYES: Sclera non-icteric.   EARS, NOSE, MOUTH AND THROAT:  The oropharynx is clear. Oral mucosa is pink and moist.    Hearing is intact to voice.  NECK: Trachea is midline, and there is no jugular venous distension.  LYMPH NODES:  Lymph nodes in the neck are not appreciated. RESPIRATORY:  Lungs are clear, and breath sounds are equal bilaterally.  Normal respiratory effort without pathologic use of accessory muscles. CARDIOVASCULAR: Heart is regular in rate and rhythm.   Well perfused.  GI: The abdomen is soft, nontender, and nondistended. There were no palpable masses.  I did not appreciate hepatosplenomegaly.  GU: Caryl Lyn present as chaperone.    Repeat DRE deferred, previous exam noted:  no significant internal or external hemorrhoids present.  There is no evidence of any fissure, sentinel tag or tenderness on anal exam.  The rectal vault was essentially empty of any formed stool.  There was some soft claylike stool on the glove upon retrieval.  But this was not palpable during exam.  No distal rectal masses or polyps are appreciated.  MUSCULOSKELETAL:  Symmetrical muscle tone appreciated in all four extremities.    SKIN: Skin turgor is normal. No pathologic skin lesions  appreciated.  NEUROLOGIC:  Motor and sensation appear grossly normal.  Cranial nerves are grossly without defect. PSYCH:  Alert and oriented to person, place and time. Affect is appropriate for situation.  Data Reviewed I have personally reviewed what is currently available of the patient's imaging, recent labs and medical records.   Labs:     Latest Ref Rng & Units 01/08/2023    9:38 PM 06/21/2021    7:45 PM 12/20/2020    9:36 PM  CBC  WBC 4.0 - 10.5 K/uL 9.0  13.0  10.8   Hemoglobin 12.0 - 15.0 g/dL 40.9  81.1  91.4   Hematocrit 36.0 - 46.0 % 36.4  39.9  38.6   Platelets 150 - 400 K/uL 360  311  281  Latest Ref Rng & Units 01/08/2023    9:38 PM 06/21/2021    7:45 PM 12/20/2020    9:36 PM  CMP  Glucose 70 - 99 mg/dL 109  323  557   BUN 6 - 20 mg/dL 5  11  9    Creatinine 0.44 - 1.00 mg/dL 3.22  0.25  4.27   Sodium 135 - 145 mmol/L 134  133  135   Potassium 3.5 - 5.1 mmol/L 3.4  4.3  3.4   Chloride 98 - 111 mmol/L 102  101  105   CO2 22 - 32 mmol/L 22  22  23    Calcium 8.9 - 10.3 mg/dL 8.9  9.5  8.9   Total Protein 6.5 - 8.1 g/dL 6.7  7.2  7.0   Total Bilirubin 0.3 - 1.2 mg/dL 0.4  0.4  0.5   Alkaline Phos 38 - 126 U/L 81  83  73   AST 15 - 41 U/L 17  17  17    ALT 0 - 44 U/L 12  13  12        Imaging: Radiological images reviewed:  CT scan examination from January 08, 2023 reviewed the images personally, and concur with report.  No evidence of appreciable constipation on that CT, question of distal common bile duct abnormality as in image below:  Narrative & Impression    EXAM: ULTRASOUND ABDOMEN LIMITED RIGHT UPPER QUADRANT   COMPARISON:  January 08, 2023.   FINDINGS: Gallbladder:   No gallstones or wall thickening visualized. No sonographic Murphy sign noted by sonographer.   Common bile duct:   Diameter: Visualized portion measures 2 mm, within normal limits. The portion of the common bile duct at the level of the pancreas is not visualized  sonographically.   Liver:   No focal lesion identified. Within normal limits in parenchymal echogenicity. Portal vein is patent on color Doppler imaging with normal direction of blood flow towards the liver.   Other: None.   IMPRESSION: No sonographic evidence of acute cholecystitis. No cholelithiasis or choledocholithiasis visualized.     Electronically Signed   By: Meda Klinefelter M.D.   On: 01/23/2023 13:44    Assessment    Abnormal CT scan of distal common bile duct, with normal LFTs. Chronic constipation, significantly improved.  Ultrasound normal.  Needing colonoscopy.  Patient Active Problem List   Diagnosis Date Noted   Abnormal abdominal CT scan 01/21/2023   Chronic constipation 01/21/2023   Benign essential HTN 04/24/2020   Coronary artery disease involving native coronary artery of native heart 04/24/2020   Hyperlipidemia, mixed 04/24/2020   Angina pectoris (HCC) 02/29/2020   COPD (chronic obstructive pulmonary disease) with chronic bronchitis 01/31/2020   Atherosclerosis of abdominal aorta (HCC) 09/10/2019   Grade I hemorrhoids 07/27/2019   Degenerative disc disease, lumbar 08/24/2015    Plan    Will make referral for GI, continue her current bowel regimen as it seems to have improved her symptoms.  Will obtain MRCP to ensure/eliminate any concerns regarding the finding on CT. I will reach out to her regarding MRCP results, and she may follow-up with me as needed after her colonoscopy.  Otherwise we have no tentative operations planned, as she has had significant symptomatic improvement with her current bowel regimen.   Face-to-face time spent with the patient and accompanying care providers(if present) was 45 minutes, with more than 50% of the time spent counseling, educating, and coordinating care of the patient.    These notes generated with  voice recognition software. I apologize for typographical errors.  Campbell Lerner M.D., FACS 02/07/2023, 9:35  AM

## 2023-02-10 NOTE — Telephone Encounter (Signed)
The recommendations have changed since 2020.  She had only 1 polyp that was precancerous.  Therefore, I said 2025.  If she is really insisting to undergo surveillance colonoscopy, go ahead and schedule it  RV

## 2023-02-12 ENCOUNTER — Telehealth: Payer: Self-pay | Admitting: Gastroenterology

## 2023-02-12 ENCOUNTER — Other Ambulatory Visit: Payer: Self-pay | Admitting: *Deleted

## 2023-02-12 ENCOUNTER — Telehealth: Payer: Self-pay | Admitting: *Deleted

## 2023-02-12 DIAGNOSIS — Z86010 Personal history of colonic polyps: Secondary | ICD-10-CM

## 2023-02-12 MED ORDER — NA SULFATE-K SULFATE-MG SULF 17.5-3.13-1.6 GM/177ML PO SOLN: 1.0000 | Freq: Once | ORAL | 0 refills | Status: AC

## 2023-02-12 NOTE — Telephone Encounter (Signed)
Natasha Reil, MD  02/10/23 11:34 AM  Note The recommendations have changed since 2020.  She had only 1 polyp that was precancerous.  Therefore, I said 2025.  If she is really insisting to undergo surveillance colonoscopy, go ahead and schedule it   RV

## 2023-02-12 NOTE — Telephone Encounter (Signed)
Colonoscopy schedule on 04/02/2023 with Dr Allegra Lai at [facility]

## 2023-02-12 NOTE — Telephone Encounter (Signed)
Patient wants to schedule her colonoscopy

## 2023-02-12 NOTE — Telephone Encounter (Signed)
Notified  the information below to patient from Dr Allegra Lai. She decided to go ahead with scheduling the colonoscopy.   Gastroenterology Pre-Procedure Review  Request Date: 04/02/2023 Requesting Physician: Dr. Allegra Lai  PATIENT REVIEW QUESTIONS: The patient responded to the following health history questions as indicated:    1. Are you having any GI issues? no 2. Do you have a personal history of Polyps? yes (06/01/2019) 3. Do you have a family history of Colon Cancer or Polyps? no 4. Diabetes Mellitus? no 5. Joint replacements in the past 12 months?no 6. Major health problems in the past 3 months?no 7. Any artificial heart valves, MVP, or defibrillator?no    MEDICATIONS & ALLERGIES:    Patient reports the following regarding taking any anticoagulation/antiplatelet therapy:   Plavix, Coumadin, Eliquis, Xarelto, Lovenox, Pradaxa, Brilinta, or Effient? no Aspirin? no  Patient confirms/reports the following medications:  Current Outpatient Medications  Medication Sig Dispense Refill   hydrocortisone (ANUSOL-HC) 25 MG suppository Place 1 suppository (25 mg total) rectally every 12 (twelve) hours. 12 suppository 1   ibuprofen (ADVIL) 800 MG tablet Take 1 tablet (800 mg total) by mouth every 8 (eight) hours as needed for mild pain. 30 tablet 0   polyethylene glycol powder (MIRALAX) 17 GM/SCOOP powder Mix with 64 ounces of Gatorade (no red) the day prior to surgery and drink as directed. 238 g 0   No current facility-administered medications for this visit.    Patient confirms/reports the following allergies:  Allergies  Allergen Reactions   Chlorhexidine Itching   Ivp Dye [Iodinated Contrast Media]    Clindamycin/Lincomycin Rash   Demerol [Meperidine] Rash   Elemental Sulfur Rash   Morphine And Codeine Hives and Rash    No orders of the defined types were placed in this encounter.   AUTHORIZATION INFORMATION Primary Insurance: 1D#: Group #:  Secondary Insurance: 1D#: Group  #:  SCHEDULE INFORMATION: Date: 04/02/2023 Time: Location: [facility]

## 2023-02-19 ENCOUNTER — Ambulatory Visit
Admission: RE | Admit: 2023-02-19 | Discharge: 2023-02-19 | Disposition: A | Discharge status: Home / Self Care | Payer: BC Managed Care – PPO | Source: Ambulatory Visit | Attending: Surgery | Admitting: Surgery

## 2023-02-19 ENCOUNTER — Other Ambulatory Visit: Payer: Self-pay | Admitting: Surgery

## 2023-02-19 DIAGNOSIS — R935 Abnormal findings on diagnostic imaging of other abdominal regions, including retroperitoneum: Secondary | ICD-10-CM | POA: Diagnosis present

## 2023-02-19 MED ORDER — GADOBUTROL 1 MMOL/ML IV SOLN
6.0000 mL | Freq: Once | INTRAVENOUS | Status: AC | PRN
  Administered 2023-02-19: 6 mL via INTRAVENOUS

## 2023-04-02 ENCOUNTER — Ambulatory Visit: Payer: BC Managed Care – PPO | Admitting: Registered Nurse

## 2023-04-02 ENCOUNTER — Encounter
Admission: RE | Disposition: A | Discharge status: Home / Self Care | Payer: Self-pay | Source: Ambulatory Visit | Attending: Gastroenterology

## 2023-04-02 ENCOUNTER — Telehealth: Payer: Self-pay | Admitting: *Deleted

## 2023-04-02 ENCOUNTER — Ambulatory Visit
Admission: RE | Admit: 2023-04-02 | Discharge: 2023-04-02 | Disposition: A | Discharge status: Home / Self Care | Payer: BC Managed Care – PPO | Source: Ambulatory Visit | Attending: Gastroenterology | Admitting: Gastroenterology

## 2023-04-02 ENCOUNTER — Encounter: Payer: Self-pay | Admitting: Gastroenterology

## 2023-04-02 DIAGNOSIS — E119 Type 2 diabetes mellitus without complications: Secondary | ICD-10-CM | POA: Diagnosis not present

## 2023-04-02 DIAGNOSIS — Q438 Other specified congenital malformations of intestine: Secondary | ICD-10-CM | POA: Diagnosis not present

## 2023-04-02 DIAGNOSIS — J449 Chronic obstructive pulmonary disease, unspecified: Secondary | ICD-10-CM | POA: Diagnosis not present

## 2023-04-02 DIAGNOSIS — Z1211 Encounter for screening for malignant neoplasm of colon: Secondary | ICD-10-CM | POA: Diagnosis present

## 2023-04-02 DIAGNOSIS — K635 Polyp of colon: Secondary | ICD-10-CM

## 2023-04-02 DIAGNOSIS — D122 Benign neoplasm of ascending colon: Secondary | ICD-10-CM

## 2023-04-02 DIAGNOSIS — F17210 Nicotine dependence, cigarettes, uncomplicated: Secondary | ICD-10-CM | POA: Insufficient documentation

## 2023-04-02 DIAGNOSIS — Z860100 Personal history of colon polyps, unspecified: Secondary | ICD-10-CM

## 2023-04-02 HISTORY — PX: POLYPECTOMY: SHX5525

## 2023-04-02 HISTORY — PX: COLONOSCOPY WITH PROPOFOL: SHX5780

## 2023-04-02 SURGERY — COLONOSCOPY WITH PROPOFOL
Anesthesia: General

## 2023-04-02 MED ORDER — SODIUM CHLORIDE 0.9 % IV SOLN
INTRAVENOUS | Status: DC
  Administered 2023-04-02: 20 mL/h via INTRAVENOUS

## 2023-04-02 MED ORDER — LIDOCAINE HCL (PF) 2 % IJ SOLN
INTRAMUSCULAR | Status: AC
  Filled 2023-04-02: qty 5

## 2023-04-02 MED ORDER — GLYCOPYRROLATE 0.2 MG/ML IJ SOLN
INTRAMUSCULAR | Status: AC
  Filled 2023-04-02: qty 1

## 2023-04-02 MED ORDER — DEXMEDETOMIDINE HCL IN NACL 80 MCG/20ML IV SOLN
INTRAVENOUS | Status: DC | PRN
  Administered 2023-04-02: 8 ug via INTRAVENOUS

## 2023-04-02 MED ORDER — PROPOFOL 10 MG/ML IV BOLUS
INTRAVENOUS | Status: DC | PRN
Start: 2023-04-02 — End: 2023-04-02
  Administered 2023-04-02: 90 mg via INTRAVENOUS

## 2023-04-02 MED ORDER — EPHEDRINE SULFATE-NACL 50-0.9 MG/10ML-% IV SOSY
PREFILLED_SYRINGE | INTRAVENOUS | Status: DC | PRN
  Administered 2023-04-02: 10 mL via INTRAVENOUS

## 2023-04-02 MED ORDER — PROPOFOL 500 MG/50ML IV EMUL
INTRAVENOUS | Status: DC | PRN
  Administered 2023-04-02: 150 ug/kg/min via INTRAVENOUS

## 2023-04-02 MED ORDER — NA SULFATE-K SULFATE-MG SULF 17.5-3.13-1.6 GM/177ML PO SOLN: 1.0000 | Freq: Once | ORAL | 0 refills | Status: AC

## 2023-04-02 MED ORDER — PROPOFOL 1000 MG/100ML IV EMUL
INTRAVENOUS | Status: AC
  Filled 2023-04-02: qty 100

## 2023-04-02 NOTE — Op Note (Signed)
[facility] Gastroenterology Patient Name: [name] Procedure Date: 04/02/2023 7:22 AM MRN: [id number] Account #: [id number] Date of Birth: 1970/11/04 Admit Type: Outpatient Age: 52 Room: [facility] ENDO ROOM 4 Gender: Female Note Status: Finalized Instrument Name: Prentice Docker [id number] Procedure:             Colonoscopy Indications:           Surveillance: Personal history of adenomatous polyps                         on last colonoscopy > 3 years ago, Last colonoscopy:                         December 2020 Providers:             Toney Reil MD, MD Referring MD:          Lyndon Code, MD (Referring MD) Medicines:             General Anesthesia Complications:         No immediate complications. Estimated blood loss: None. Procedure:             Pre-Anesthesia Assessment:                        - Prior to the procedure, a History and Physical was                         performed, and patient medications and allergies were                         reviewed. The patient is competent. The risks and                         benefits of the procedure and the sedation options and                         risks were discussed with the patient. All questions                         were answered and informed consent was obtained.                         Patient identification and proposed procedure were                         verified by the physician, the nurse, the                         anesthesiologist, the anesthetist and the technician                         in the pre-procedure area in the procedure room in the                         endoscopy suite. Mental Status Examination: alert and                         oriented. Airway Examination: normal oropharyngeal  airway and neck mobility. Respiratory Examination:                         clear to auscultation. CV Examination: normal.                         Prophylactic Antibiotics: The  patient does not require                         prophylactic antibiotics. Prior Anticoagulants: The                         patient has taken no anticoagulant or antiplatelet                         agents. ASA Grade Assessment: II - A patient with mild                         systemic disease. After reviewing the risks and                         benefits, the patient was deemed in satisfactory                         condition to undergo the procedure. The anesthesia                         plan was to use general anesthesia. Immediately prior                         to administration of medications, the patient was                         re-assessed for adequacy to receive sedatives. The                         heart rate, respiratory rate, oxygen saturations,                         blood pressure, adequacy of pulmonary ventilation, and                         response to care were monitored throughout the                         procedure. The physical status of the patient was                         re-assessed after the procedure.                        After obtaining informed consent, the colonoscope was                         passed under direct vision. Throughout the procedure,                         the patient's blood pressure, pulse, and oxygen  saturations were monitored continuously. The                         Colonoscope was introduced through the anus and                         advanced to the the cecum, identified by appendiceal                         orifice and ileocecal valve. The colonoscopy was                         unusually difficult due to inadequate bowel prep and                         significant looping. Successful completion of the                         procedure was aided by applying abdominal pressure.                         The patient tolerated the procedure well. The quality                         of the bowel  preparation was poor. The ileocecal                         valve, appendiceal orifice, and rectum were                         photographed. Findings:      The perianal and digital rectal examinations were normal. Pertinent       negatives include normal sphincter tone and no palpable rectal lesions.      A 2 mm polyp was found in the ascending colon. The polyp was sessile.       The polyp was removed with a jumbo cold forceps. Resection and retrieval       were complete.      Extensive amounts of semi-liquid stool was found in the entire colon,       precluding visualization.      The retroflexed view of the distal rectum and anal verge was normal and       showed no anal or rectal abnormalities. Impression:            - Preparation of the colon was poor.                        - One 2 mm polyp in the ascending colon, removed with                         a jumbo cold forceps. Resected and retrieved.                        - Stool in the entire examined colon.                        - The distal rectum and anal verge are normal on  retroflexion view. Recommendation:        - Discharge patient to home (with escort).                        - Resume previous diet today.                        - Continue present medications.                        - Await pathology results.                        - Repeat colonoscopy tomorrow with repeat prep if pt                         is agreeable, otherwise in 3 months with 2 day prep                         because the bowel preparation was suboptimal. Procedure Code(s):     --- Professional ---                        [phone], Colonoscopy, flexible; with biopsy, single or                         multiple Diagnosis Code(s):     --- Professional ---                        Z86.010, Personal history of colonic polyps                        D12.2, Benign neoplasm of ascending colon CPT copyright 2022 American Medical Association.  All rights reserved. The codes documented in this report are preliminary and upon coder review may  be revised to meet current compliance requirements. Dr. Libby Maw Toney Reil MD, MD 04/02/2023 8:23:07 AM This report has been signed electronically. Number of Addenda: 0 Note Initiated On: 04/02/2023 7:22 AM Scope Withdrawal Time: 0 hours 6 minutes 24 seconds  Total Procedure Duration: 0 hours 15 minutes 25 seconds  Estimated Blood Loss:  Estimated blood loss: none.      [facility]

## 2023-04-02 NOTE — Telephone Encounter (Signed)
Patient had her colonoscopy today, 04/02/2023. However, she was transfer to my phone because she was not cleaned out. Dr Allegra Lai have patient do a repeat Thursday, 04/03/2023.  Need to send another kit of Suprep to [facility] for patient.    Patient verbalized understanding.

## 2023-04-02 NOTE — Anesthesia Postprocedure Evaluation (Signed)
Anesthesia Post Note  Patient: [name]  Procedure(s) Performed: COLONOSCOPY WITH PROPOFOL POLYPECTOMY  Patient location during evaluation: Endoscopy Anesthesia Type: General Level of consciousness: awake and alert Pain management: pain level controlled Vital Signs Assessment: post-procedure vital signs reviewed and stable Respiratory status: spontaneous breathing, nonlabored ventilation, respiratory function stable and patient connected to nasal cannula oxygen Cardiovascular status: blood pressure returned to baseline and stable Postop Assessment: no apparent nausea or vomiting Anesthetic complications: no   No notable events documented.   Last Vitals:  Vitals:   04/02/23 0825 04/02/23 0845  BP: (!) 83/58 115/86  Pulse: 80 85  Resp: (!) 22 (!) 22  Temp:    SpO2: 98% 100%    Last Pain:  Vitals:   04/02/23 0845  TempSrc:   PainSc: 0-No pain                 Lenard Simmer

## 2023-04-02 NOTE — Telephone Encounter (Signed)
Patient called in because she needs a prior authorization for her prep.

## 2023-04-02 NOTE — Transfer of Care (Signed)
Immediate Anesthesia Transfer of Care Note  Patient: [name]  Procedure(s) Performed: Procedure(s): COLONOSCOPY WITH PROPOFOL (N/A) POLYPECTOMY  Patient Location: PACU and Endoscopy Unit  Anesthesia Type:General  Level of Consciousness: sedated  Airway & Oxygen Therapy: Patient Spontanous Breathing and Patient connected to nasal cannula oxygen  Post-op Assessment: Report given to RN and Post -op Vital signs reviewed and stable  Post vital signs: Reviewed and stable  Last Vitals:  Vitals:   04/02/23 0646 04/02/23 0825  BP: (!) 143/87 (!) 83/58  Pulse: 84 80  Resp: 20 (!) 22  Temp: 37 C   SpO2: 100% 98%    Complications: No apparent anesthesia complications

## 2023-04-02 NOTE — Anesthesia Procedure Notes (Signed)
Date/Time: 04/02/2023 8:15 AM  Performed by: Stormy Fabian, CRNAPre-anesthesia Checklist: Patient identified, Emergency Drugs available, Suction available and Patient being monitored Patient Re-evaluated:Patient Re-evaluated prior to induction Oxygen Delivery Method: Nasal cannula Induction Type: IV induction Dental Injury: Teeth and Oropharynx as per pre-operative assessment  Comments: Nasal cannula with etCO2 monitoring

## 2023-04-02 NOTE — Anesthesia Preprocedure Evaluation (Signed)
Anesthesia Evaluation  Patient identified by MRN, date of birth, ID band Patient awake    Reviewed: Allergy & Precautions, H&P , NPO status , Patient's Chart, lab work & pertinent test results, reviewed documented beta blocker date and time   History of Anesthesia Complications Negative for: history of anesthetic complications  Airway Mallampati: II   Neck ROM: full    Dental  (+) Poor Dentition, Dental Advidsory Given, Edentulous Upper, Edentulous Lower   Pulmonary neg shortness of breath, neg sleep apnea, COPD, neg recent URI, Current Smoker and Patient abstained from smoking.   Pulmonary exam normal        Cardiovascular Exercise Tolerance: Good negative cardio ROS Normal cardiovascular exam Rhythm:regular Rate:Normal     Neuro/Psych  Headaches, Seizures -, Well Controlled,   negative psych ROS   GI/Hepatic negative GI ROS, Neg liver ROS,,,  Endo/Other  diabetes, Well Controlled, Type 2    Renal/GU negative Renal ROS  negative genitourinary   Musculoskeletal   Abdominal   Peds  Hematology negative hematology ROS (+)   Anesthesia Other Findings Past Medical History: No date: Back pain No date: Cancer (HCC)     Comment:  + CANCER CELLS TO UTERUS No date: Diabetes mellitus without complication (HCC) No date: Family history of adverse reaction to anesthesia     Comment:  FATHER STOPS BREATHING WITH ANESTHESIA No date: Headache     Comment:  HX MIGRAINES No date: Seizures (HCC)     Comment:  1 SEIZURE  VERY STRESSED Past Surgical History: No date: ABDOMINAL HYSTERECTOMY No date: APPENDECTOMY No date: BACK SURGERY   Reproductive/Obstetrics negative OB ROS                             Anesthesia Physical Anesthesia Plan  ASA: 2  Anesthesia Plan: General   Post-op Pain Management:    Induction: Intravenous  PONV Risk Score and Plan: 2 and Propofol infusion, TIVA and Treatment  may vary due to age or medical condition  Airway Management Planned: Natural Airway and Nasal Cannula  Additional Equipment:   Intra-op Plan:   Post-operative Plan:   Informed Consent: I have reviewed the patients History and Physical, chart, labs and discussed the procedure including the risks, benefits and alternatives for the proposed anesthesia with the patient or authorized representative who has indicated his/her understanding and acceptance.     Dental Advisory Given  Plan Discussed with: CRNA  Anesthesia Plan Comments:         Anesthesia Quick Evaluation

## 2023-04-02 NOTE — H&P (Signed)
Arlyss Repress, MD [address]  [address]  [place], [place] [address]  Main: [phone]  Fax: [phone] Pager: [phone]  Primary Care Physician:  Lyndon Code, MD Primary Gastroenterologist:  Dr. Arlyss Repress  Pre-Procedure History & Physical: HPI:  [name] is a 52 y.o. female is here for an colonoscopy.   Past Medical History:  Diagnosis Date   Back pain    Cancer (HCC)    + CANCER CELLS TO UTERUS   COPD (chronic obstructive pulmonary disease) (HCC)    Family history of adverse reaction to anesthesia    FATHER STOPS BREATHING WITH ANESTHESIA   Headache    HX MIGRAINES   Seizures (HCC)    1 SEIZURE  VERY STRESSED    Past Surgical History:  Procedure Laterality Date   ABDOMINAL HYSTERECTOMY     APPENDECTOMY     BACK SURGERY  2016   COLONOSCOPY WITH PROPOFOL N/A 06/01/2019   Procedure: COLONOSCOPY WITH PROPOFOL;  Surgeon: Wyline Mood, MD;  Location: [facility] ENDOSCOPY;  Service: Gastroenterology;  Laterality: N/A;   LEFT HEART CATH AND CORONARY ANGIOGRAPHY Left 03/24/2020   Procedure: LEFT HEART CATH AND CORONARY ANGIOGRAPHY;  Surgeon: Lamar Blinks, MD;  Location: [facility] INVASIVE CV LAB;  Service: Cardiovascular;  Laterality: Left;    Prior to Admission medications   Medication Sig Start Date End Date Taking? Authorizing Provider  hydrocortisone (ANUSOL-HC) 25 MG suppository Place 1 suppository (25 mg total) rectally every 12 (twelve) hours. 01/09/23 01/09/24 Yes Chesley Noon, MD  ibuprofen (ADVIL) 800 MG tablet Take 1 tablet (800 mg total) by mouth every 8 (eight) hours as needed for mild pain. 08/26/21  Yes Ward, Layla Maw, DO  polyethylene glycol powder (MIRALAX) 17 GM/SCOOP powder Mix with 64 ounces of Gatorade (no red) the day prior to surgery and drink as directed. 01/21/23  Yes Campbell Lerner, MD    Allergies as of 02/12/2023 - Review Complete 02/06/2023  Allergen Reaction Noted   Chlorhexidine Itching 08/24/2015   Ivp dye [iodinated  contrast media]  06/22/2021   Clindamycin/lincomycin Rash 03/24/2020   Demerol [meperidine] Rash 05/26/2015   Elemental sulfur Rash 11/26/2018   Morphine and codeine Hives and Rash 05/26/2015    Family History  Problem Relation Age of Onset   Heart attack Father    Lupus Sister    Breast cancer Maternal Grandmother 12   Lupus Maternal Grandfather     Social History   Socioeconomic History   Marital status: Legally Separated    Spouse name: Not on file   Number of children: 2   Years of education: Not on file   Highest education level: Not on file  Occupational History   Not on file  Tobacco Use   Smoking status: Light Smoker    Current packs/day: 0.50    Average packs/day: 0.5 packs/day for 28.0 years (14.0 ttl pk-yrs)    Types: Cigarettes    Passive exposure: Past   Smokeless tobacco: Never   Tobacco comments:    Half a pack  Vaping Use   Vaping status: Never Used  Substance and Sexual Activity   Alcohol use: No    Alcohol/week: 0.0 standard drinks of alcohol   Drug use: No   Sexual activity: Not on file  Other Topics Concern   Not on file  Social History Narrative   Lives with boyfriend    Social Determinants of Health   Financial Resource Strain: Not on file  Food Insecurity: Not on  file  Transportation Needs: Not on file  Physical Activity: Not on file  Stress: Not on file  Social Connections: Not on file  Intimate Partner Violence: Not on file    Review of Systems: See HPI, otherwise negative ROS  Physical Exam: BP (!) 143/87   Pulse 84   Temp 98.6 F (37 C) (Temporal)   Resp 20   Ht 5\' 4"  (1.626 m)   Wt 63.3 kg   SpO2 100%   BMI 23.96 kg/m  General:   Alert,  pleasant and cooperative in NAD Head:  Normocephalic and atraumatic. Neck:  Supple; no masses or thyromegaly. Lungs:  Clear throughout to auscultation.    Heart:  Regular rate and rhythm. Abdomen:  Soft, nontender and nondistended. Normal bowel sounds, without guarding, and  without rebound.   Neurologic:  Alert and  oriented x4;  grossly normal neurologically.  Impression/Plan: [name] is here for a colonoscopy to be performed for h/o colon adenoma  Risks, benefits, limitations, and alternatives regarding  colonoscopy have been reviewed with the patient.  Questions have been answered.  All parties agreeable.   Lannette Donath, MD  04/02/2023, 7:54 AM

## 2023-04-02 NOTE — Telephone Encounter (Signed)
Spoken to patient and inform her that we do not due prior authorization for prep solution.  Offer to give a sample of Clenipiq, patient is agreeable.  Left sample in the front office for patient to pick up with directions as well.  Patient verbalized understanding.

## 2023-04-03 ENCOUNTER — Ambulatory Visit: Payer: BC Managed Care – PPO | Admitting: Anesthesiology

## 2023-04-03 ENCOUNTER — Encounter: Payer: Self-pay | Admitting: Gastroenterology

## 2023-04-03 ENCOUNTER — Ambulatory Visit
Admission: RE | Admit: 2023-04-03 | Discharge: 2023-04-03 | Disposition: A | Discharge status: Home / Self Care | Payer: BC Managed Care – PPO | Source: Ambulatory Visit | Attending: Gastroenterology | Admitting: Gastroenterology

## 2023-04-03 ENCOUNTER — Encounter
Admission: RE | Disposition: A | Discharge status: Home / Self Care | Payer: Self-pay | Source: Ambulatory Visit | Attending: Gastroenterology

## 2023-04-03 ENCOUNTER — Other Ambulatory Visit: Payer: Self-pay

## 2023-04-03 DIAGNOSIS — D120 Benign neoplasm of cecum: Secondary | ICD-10-CM | POA: Insufficient documentation

## 2023-04-03 DIAGNOSIS — Z09 Encounter for follow-up examination after completed treatment for conditions other than malignant neoplasm: Secondary | ICD-10-CM | POA: Diagnosis not present

## 2023-04-03 DIAGNOSIS — K635 Polyp of colon: Secondary | ICD-10-CM

## 2023-04-03 DIAGNOSIS — F17210 Nicotine dependence, cigarettes, uncomplicated: Secondary | ICD-10-CM | POA: Diagnosis not present

## 2023-04-03 DIAGNOSIS — Z1211 Encounter for screening for malignant neoplasm of colon: Secondary | ICD-10-CM | POA: Diagnosis not present

## 2023-04-03 DIAGNOSIS — Z860101 Personal history of adenomatous and serrated colon polyps: Secondary | ICD-10-CM | POA: Diagnosis not present

## 2023-04-03 DIAGNOSIS — G40909 Epilepsy, unspecified, not intractable, without status epilepticus: Secondary | ICD-10-CM | POA: Diagnosis not present

## 2023-04-03 DIAGNOSIS — D124 Benign neoplasm of descending colon: Secondary | ICD-10-CM | POA: Insufficient documentation

## 2023-04-03 DIAGNOSIS — K562 Volvulus: Secondary | ICD-10-CM

## 2023-04-03 DIAGNOSIS — E119 Type 2 diabetes mellitus without complications: Secondary | ICD-10-CM | POA: Diagnosis not present

## 2023-04-03 HISTORY — PX: COLONOSCOPY: SHX5424

## 2023-04-03 HISTORY — PX: POLYPECTOMY: SHX5525

## 2023-04-03 LAB — SURGICAL PATHOLOGY

## 2023-04-03 SURGERY — COLONOSCOPY
Anesthesia: General

## 2023-04-03 MED ORDER — PROPOFOL 10 MG/ML IV BOLUS
INTRAVENOUS | Status: DC | PRN
  Administered 2023-04-03: 80 mg via INTRAVENOUS

## 2023-04-03 MED ORDER — SODIUM CHLORIDE 0.9 % IV SOLN: INTRAVENOUS | Status: DC

## 2023-04-03 MED ORDER — PROPOFOL 500 MG/50ML IV EMUL
INTRAVENOUS | Status: DC | PRN
  Administered 2023-04-03: 125 ug/kg/min via INTRAVENOUS

## 2023-04-03 MED ORDER — LIDOCAINE HCL (CARDIAC) PF 100 MG/5ML IV SOSY
PREFILLED_SYRINGE | INTRAVENOUS | Status: DC | PRN
  Administered 2023-04-03: 50 mg via INTRAVENOUS

## 2023-04-03 NOTE — H&P (Signed)
Arlyss Repress, MD [address]  [address]  [place], [place] [address]  Main: [phone]  Fax: [phone] Pager: [phone]  Primary Care Physician:  Lyndon Code, MD Primary Gastroenterologist:  Dr. Arlyss Repress  Pre-Procedure History & Physical: HPI:  [name] is a 52 y.o. female is here for an colonoscopy.   Past Medical History:  Diagnosis Date   Back pain    Cancer (HCC)    + CANCER CELLS TO UTERUS   COPD (chronic obstructive pulmonary disease) (HCC)    Family history of adverse reaction to anesthesia    FATHER STOPS BREATHING WITH ANESTHESIA   Headache    HX MIGRAINES   Seizures (HCC)    1 SEIZURE  VERY STRESSED    Past Surgical History:  Procedure Laterality Date   ABDOMINAL HYSTERECTOMY     APPENDECTOMY     BACK SURGERY  2016   COLONOSCOPY WITH PROPOFOL N/A 06/01/2019   Procedure: COLONOSCOPY WITH PROPOFOL;  Surgeon: Wyline Mood, MD;  Location: [facility] ENDOSCOPY;  Service: Gastroenterology;  Laterality: N/A;   COLONOSCOPY WITH PROPOFOL N/A 04/02/2023   Procedure: COLONOSCOPY WITH PROPOFOL;  Surgeon: Toney Reil, MD;  Location: [facility] ENDOSCOPY;  Service: Gastroenterology;  Laterality: N/A;   LEFT HEART CATH AND CORONARY ANGIOGRAPHY Left 03/24/2020   Procedure: LEFT HEART CATH AND CORONARY ANGIOGRAPHY;  Surgeon: Lamar Blinks, MD;  Location: [facility] INVASIVE CV LAB;  Service: Cardiovascular;  Laterality: Left;   POLYPECTOMY  04/02/2023   Procedure: POLYPECTOMY;  Surgeon: Toney Reil, MD;  Location: [facility] ENDOSCOPY;  Service: Gastroenterology;;    Prior to Admission medications   Medication Sig Start Date End Date Taking? Authorizing Provider  hydrocortisone (ANUSOL-HC) 25 MG suppository Place 1 suppository (25 mg total) rectally every 12 (twelve) hours. Patient not taking: Reported on 04/03/2023 01/09/23 01/09/24  Chesley Noon, MD  ibuprofen (ADVIL) 800 MG tablet Take 1 tablet (800 mg total) by mouth every 8 (eight) hours as  needed for mild pain. Patient not taking: Reported on 04/03/2023 08/26/21   Ward, Layla Maw, DO  polyethylene glycol powder (MIRALAX) 17 GM/SCOOP powder Mix with 64 ounces of Gatorade (no red) the day prior to surgery and drink as directed. Patient not taking: Reported on 04/03/2023 01/21/23   Campbell Lerner, MD    Allergies as of 04/02/2023 - Review Complete 04/02/2023  Allergen Reaction Noted   Chlorhexidine Itching 08/24/2015   Ivp dye [iodinated contrast media]  06/22/2021   Clindamycin/lincomycin Rash 03/24/2020   Demerol [meperidine] Rash 05/26/2015   Elemental sulfur Rash 11/26/2018   Morphine and codeine Hives and Rash 05/26/2015    Family History  Problem Relation Age of Onset   Heart attack Father    Lupus Sister    Breast cancer Maternal Grandmother 59   Lupus Maternal Grandfather     Social History   Socioeconomic History   Marital status: Legally Separated    Spouse name: Not on file   Number of children: 2   Years of education: Not on file   Highest education level: Not on file  Occupational History   Not on file  Tobacco Use   Smoking status: Light Smoker    Current packs/day: 0.50    Average packs/day: 0.5 packs/day for 28.0 years (14.0 ttl pk-yrs)    Types: Cigarettes    Passive exposure: Past   Smokeless tobacco: Never   Tobacco comments:    Half a pack  Vaping Use   Vaping status: Every Day  Substance and Sexual Activity   Alcohol use: No    Alcohol/week: 0.0 standard drinks of alcohol   Drug use: No   Sexual activity: Not on file  Other Topics Concern   Not on file  Social History Narrative   Lives with boyfriend    Social Determinants of Health   Financial Resource Strain: Not on file  Food Insecurity: Not on file  Transportation Needs: Not on file  Physical Activity: Not on file  Stress: Not on file  Social Connections: Not on file  Intimate Partner Violence: Not on file    Review of Systems: See HPI, otherwise negative  ROS  Physical Exam: BP 115/73   Pulse 87   Temp (!) 96.7 F (35.9 C) (Temporal)   Resp 17   Ht 5\' 4"  (1.626 m)   Wt 63.2 kg   SpO2 100%   BMI 23.93 kg/m  General:   Alert,  pleasant and cooperative in NAD Head:  Normocephalic and atraumatic. Neck:  Supple; no masses or thyromegaly. Lungs:  Clear throughout to auscultation.    Heart:  Regular rate and rhythm. Abdomen:  Soft, nontender and nondistended. Normal bowel sounds, without guarding, and without rebound.   Neurologic:  Alert and  oriented x4;  grossly normal neurologically.  Impression/Plan: [name] is here for an colonoscopy to be performed for h/o colon adenoma, inadequate prep  Risks, benefits, limitations, and alternatives regarding  colonoscopy have been reviewed with the patient.  Questions have been answered.  All parties agreeable.   Lannette Donath, MD  04/03/2023, 1:08 PM

## 2023-04-03 NOTE — Transfer of Care (Signed)
Immediate Anesthesia Transfer of Care Note  Patient: [name]  Procedure(s) Performed: COLONOSCOPY POLYPECTOMY  Patient Location: PACU  Anesthesia Type:General  Level of Consciousness: awake, oriented, and patient cooperative  Airway & Oxygen Therapy: Patient Spontanous Breathing  Post-op Assessment: Report given to RN and Post -op Vital signs reviewed and stable  Post vital signs: Reviewed and stable  Last Vitals:  Vitals Value Taken Time  BP 92/64 1345 04/03/23  Temp    Pulse 78   Resp 22   SpO2 98%     Last Pain:  Vitals:   04/03/23 1301  TempSrc: Temporal  PainSc: 0-No pain         Complications: No notable events documented.

## 2023-04-03 NOTE — Anesthesia Preprocedure Evaluation (Signed)
Anesthesia Evaluation  Patient identified by MRN, date of birth, ID band Patient awake    Reviewed: Allergy & Precautions, H&P , NPO status , Patient's Chart, lab work & pertinent test results, reviewed documented beta blocker date and time   History of Anesthesia Complications Negative for: history of anesthetic complications  Airway Mallampati: II   Neck ROM: full    Dental  (+) Poor Dentition, Dental Advidsory Given, Edentulous Upper, Edentulous Lower   Pulmonary neg shortness of breath, neg sleep apnea, COPD, neg recent URI, Current Smoker and Patient abstained from smoking.   Pulmonary exam normal        Cardiovascular Exercise Tolerance: Good negative cardio ROS Normal cardiovascular exam Rhythm:regular Rate:Normal     Neuro/Psych  Headaches, Seizures -, Well Controlled,   negative psych ROS   GI/Hepatic negative GI ROS, Neg liver ROS,,,  Endo/Other  diabetes, Well Controlled, Type 2    Renal/GU negative Renal ROS  negative genitourinary   Musculoskeletal   Abdominal   Peds  Hematology negative hematology ROS (+)   Anesthesia Other Findings Past Medical History: No date: Back pain No date: Cancer (HCC)     Comment:  + CANCER CELLS TO UTERUS No date: Diabetes mellitus without complication (HCC) No date: Family history of adverse reaction to anesthesia     Comment:  FATHER STOPS BREATHING WITH ANESTHESIA No date: Headache     Comment:  HX MIGRAINES No date: Seizures (HCC)     Comment:  1 SEIZURE  VERY STRESSED Past Surgical History: No date: ABDOMINAL HYSTERECTOMY No date: APPENDECTOMY No date: BACK SURGERY   Reproductive/Obstetrics negative OB ROS                             Anesthesia Physical Anesthesia Plan  ASA: 2  Anesthesia Plan: General   Post-op Pain Management:    Induction: Intravenous  PONV Risk Score and Plan: 2 and Propofol infusion, TIVA and Treatment  may vary due to age or medical condition  Airway Management Planned: Natural Airway and Nasal Cannula  Additional Equipment:   Intra-op Plan:   Post-operative Plan:   Informed Consent: I have reviewed the patients History and Physical, chart, labs and discussed the procedure including the risks, benefits and alternatives for the proposed anesthesia with the patient or authorized representative who has indicated his/her understanding and acceptance.     Dental Advisory Given  Plan Discussed with: CRNA  Anesthesia Plan Comments:         Anesthesia Quick Evaluation

## 2023-04-03 NOTE — Op Note (Signed)
[facility] Gastroenterology Patient Name: Karenann Mcgrory Procedure Date: 04/03/2023 1:10 PM MRN: [id number] Account #: [id number] Date of Birth: 1970-09-04 Admit Type: Outpatient Age: 52 Room: [facility] ENDO ROOM 3 Gender: Female Note Status: Finalized Instrument Name: Nelda Marseille [id number] Procedure:             Colonoscopy Indications:           High risk colon cancer surveillance: Personal history                         of colonic polyps, Surveillance: History of                         adenomatous polyps, inadequate prep on last exam                         (<66yr), Last colonoscopy: October 2024 Providers:             Toney Reil MD, MD Referring MD:          Lyndon Code, MD (Referring MD) Medicines:             General Anesthesia Complications:         No immediate complications. Estimated blood loss: None. Procedure:             Pre-Anesthesia Assessment:                        - Prior to the procedure, a History and Physical was                         performed, and patient medications and allergies were                         reviewed. The patient is competent. The risks and                         benefits of the procedure and the sedation options and                         risks were discussed with the patient. All questions                         were answered and informed consent was obtained.                         Patient identification and proposed procedure were                         verified by the physician, the nurse, the                         anesthesiologist, the anesthetist and the technician                         in the pre-procedure area in the procedure room in the                         endoscopy suite. Mental Status Examination: alert and  oriented. Airway Examination: normal oropharyngeal                         airway and neck mobility. Respiratory Examination:                         clear to  auscultation. CV Examination: normal.                         Prophylactic Antibiotics: The patient does not require                         prophylactic antibiotics. Prior Anticoagulants: The                         patient has taken no anticoagulant or antiplatelet                         agents. ASA Grade Assessment: II - A patient with mild                         systemic disease. After reviewing the risks and                         benefits, the patient was deemed in satisfactory                         condition to undergo the procedure. The anesthesia                         plan was to use general anesthesia. Immediately prior                         to administration of medications, the patient was                         re-assessed for adequacy to receive sedatives. The                         heart rate, respiratory rate, oxygen saturations,                         blood pressure, adequacy of pulmonary ventilation, and                         response to care were monitored throughout the                         procedure. The physical status of the patient was                         re-assessed after the procedure.                        After obtaining informed consent, the colonoscope was                         passed under direct vision. Throughout the procedure,  the patient's blood pressure, pulse, and oxygen                         saturations were monitored continuously. The                         Colonoscope was introduced through the anus and                         advanced to the the cecum, identified by appendiceal                         orifice and ileocecal valve. The colonoscopy was                         performed with moderate difficulty due to significant                         looping. Successful completion of the procedure was                         aided by applying abdominal pressure. The patient                          tolerated the procedure well. The quality of the bowel                         preparation was evaluated using the BBPS [facility] Bowel                         Preparation Scale) with scores of: Right Colon = 3,                         Transverse Colon = 3 and Left Colon = 3 (entire mucosa                         seen well with no residual staining, small fragments                         of stool or opaque liquid). The total BBPS score                         equals 9. The ileocecal valve, appendiceal orifice,                         and rectum were photographed. Findings:      The perianal and digital rectal examinations were normal. Pertinent       negatives include normal sphincter tone and no palpable rectal lesions.      A diminutive polyp was found in the cecum. The polyp was sessile. The       polyp was removed with a jumbo cold forceps. Resection and retrieval       were complete. Estimated blood loss: none.      A 5 mm polyp was found in the descending colon. The polyp was sessile.       The polyp was removed with a cold snare. Resection and retrieval were  complete. Estimated blood loss: none.      The retroflexed view of the distal rectum and anal verge was normal and       showed no anal or rectal abnormalities. Impression:            - One diminutive polyp in the cecum, removed with a                         jumbo cold forceps. Resected and retrieved.                        - One 5 mm polyp in the descending colon, removed with                         a cold snare. Resected and retrieved.                        - The distal rectum and anal verge are normal on                         retroflexion view. Recommendation:        - Discharge patient to home (with escort).                        - Resume previous diet today.                        - Continue present medications.                        - Await pathology results.                        - Repeat colonoscopy in  5-10 years for surveillance                         based on pathology results. Procedure Code(s):     --- Professional ---                        [phone], Colonoscopy, flexible; with removal of                         tumor(s), polyp(s), or other lesion(s) by snare                         technique                        [id number], 59, Colonoscopy, flexible; with biopsy, single                         or multiple Diagnosis Code(s):     --- Professional ---                        D12.0, Benign neoplasm of cecum                        D12.4, Benign neoplasm of descending colon  Z86.010, Personal history of colonic polyps CPT copyright 2022 American [employer]. All rights reserved. The codes documented in this report are preliminary and upon coder review may  be revised to meet current compliance requirements. Dr. Libby Maw Toney Reil MD, MD 04/03/2023 1:42:08 PM This report has been signed electronically. Number of Addenda: 0 Note Initiated On: 04/03/2023 1:10 PM Scope Withdrawal Time: 0 hours 9 minutes 23 seconds  Total Procedure Duration: 0 hours 13 minutes 58 seconds  Estimated Blood Loss:  Estimated blood loss: none.      [facility]

## 2023-04-04 ENCOUNTER — Encounter: Payer: Self-pay | Admitting: Gastroenterology

## 2023-04-05 NOTE — Anesthesia Postprocedure Evaluation (Signed)
Anesthesia Post Note  Patient: [name]  Procedure(s) Performed: COLONOSCOPY POLYPECTOMY  Patient location during evaluation: Endoscopy Anesthesia Type: General Level of consciousness: awake and alert Pain management: pain level controlled Vital Signs Assessment: post-procedure vital signs reviewed and stable Respiratory status: spontaneous breathing, nonlabored ventilation, respiratory function stable and patient connected to nasal cannula oxygen Cardiovascular status: blood pressure returned to baseline and stable Postop Assessment: no apparent nausea or vomiting Anesthetic complications: no   No notable events documented.   Last Vitals:  Vitals:   04/03/23 1344 04/03/23 1354  BP: 92/64 119/79  Pulse: 81 79  Resp: 17 12  Temp: (!) 35.8 C   SpO2: 100% 100%    Last Pain:  Vitals:   04/03/23 1354  TempSrc:   PainSc: 0-No pain                 Lenard Simmer

## 2023-04-07 ENCOUNTER — Encounter: Payer: Self-pay | Admitting: Gastroenterology

## 2023-04-07 LAB — SURGICAL PATHOLOGY

## 2023-06-10 ENCOUNTER — Telehealth: Payer: Self-pay | Admitting: Internal Medicine

## 2023-06-10 NOTE — Telephone Encounter (Signed)
Lvm for patient to return call to confirm we are still her pcp-[name]

## 2023-06-12 ENCOUNTER — Telehealth: Payer: Self-pay | Admitting: Internal Medicine

## 2023-06-12 NOTE — Telephone Encounter (Signed)
Appointment letter mailed to patient. Scanned.

## 2023-09-28 ENCOUNTER — Other Ambulatory Visit: Payer: Self-pay

## 2023-09-28 ENCOUNTER — Emergency Department
Admission: EM | Admit: 2023-09-28 | Discharge: 2023-09-28 | Disposition: A | Discharge status: Home / Self Care | Payer: Self-pay | Attending: Emergency Medicine | Admitting: Emergency Medicine

## 2023-09-28 DIAGNOSIS — N764 Abscess of vulva: Secondary | ICD-10-CM | POA: Insufficient documentation

## 2023-09-28 DIAGNOSIS — J449 Chronic obstructive pulmonary disease, unspecified: Secondary | ICD-10-CM | POA: Insufficient documentation

## 2023-09-28 LAB — URINALYSIS, ROUTINE W REFLEX MICROSCOPIC
Bacteria, UA: NONE SEEN
Bilirubin Urine: NEGATIVE
Glucose, UA: NEGATIVE mg/dL
Ketones, ur: NEGATIVE mg/dL
Leukocytes,Ua: NEGATIVE
Nitrite: NEGATIVE
Protein, ur: NEGATIVE mg/dL
Specific Gravity, Urine: 1.003 — ABNORMAL LOW (ref 1.005–1.030)
pH: 6 (ref 5.0–8.0)

## 2023-09-28 LAB — CBG MONITORING, ED: Glucose-Capillary: 146 mg/dL — ABNORMAL HIGH (ref 70–99)

## 2023-09-28 MED ORDER — IBUPROFEN 800 MG PO TABS: 800.0000 mg | ORAL_TABLET | Freq: Three times a day (TID) | ORAL | 0 refills | Status: DC | PRN

## 2023-09-28 MED ORDER — CEPHALEXIN 500 MG PO CAPS
500.0000 mg | ORAL_CAPSULE | Freq: Once | ORAL | Status: AC
  Administered 2023-09-28: 500 mg via ORAL
  Filled 2023-09-28: qty 1

## 2023-09-28 MED ORDER — CEPHALEXIN 500 MG PO CAPS: 500.0000 mg | ORAL_CAPSULE | Freq: Two times a day (BID) | ORAL | 0 refills | Status: DC

## 2023-09-28 MED ORDER — IBUPROFEN 800 MG PO TABS
800.0000 mg | ORAL_TABLET | Freq: Once | ORAL | Status: AC
  Administered 2023-09-28: 800 mg via ORAL
  Filled 2023-09-28: qty 1

## 2023-09-28 NOTE — Discharge Instructions (Signed)
You may alternate Tylenol 1000 mg every 6 hours as needed for pain, fever and Ibuprofen 800 mg every 6-8 hours as needed for pain, fever.  Please take Ibuprofen with food.  Do not take more than 4000 mg of Tylenol (acetaminophen) in a 24 hour period. ° °

## 2023-09-28 NOTE — ED Triage Notes (Signed)
 Pt to ED by pov from home with c/o an abscess to her vaginal area. Pt also endorses painful urination and urinary frequency. Arrives A+O, VSS, NADN.

## 2023-09-28 NOTE — ED Provider Notes (Signed)
 [facility] Provider Note    Event Date/Time   First MD Initiated Contact with Patient 09/28/23 0246     (approximate)   History   Abscess   HPI  [name] is a 53 y.o. female with history of COPD, migraines who presents to the emergency department with recurrent abscess to her mons/labia.  She states it will intermittently drain, resolve and then come back.  She states that she was previously told by her OB/GYN that she had diabetes and was on metformin but then was told by another doctor that she did not have diabetes and she was concerned about taking metformin for a long period of time as she states it caused her mother to go into renal failure.  She is not taking metformin or any other diabetes medication.  She denies any fevers.  Reports she has had some dysuria but no hematuria, vaginal bleeding or discharge.  She has had previous hysterectomy.  No vomiting.   History provided by patient.    Past Medical History:  Diagnosis Date   Back pain    Cancer (HCC)    + CANCER CELLS TO UTERUS   COPD (chronic obstructive pulmonary disease) (HCC)    Family history of adverse reaction to anesthesia    FATHER STOPS BREATHING WITH ANESTHESIA   Headache    HX MIGRAINES   Seizures (HCC)    1 SEIZURE  VERY STRESSED    Past Surgical History:  Procedure Laterality Date   ABDOMINAL HYSTERECTOMY     APPENDECTOMY     BACK SURGERY  2016   COLONOSCOPY N/A 04/03/2023   Procedure: COLONOSCOPY;  Surgeon: Toney Reil, MD;  Location: [facility] ENDOSCOPY;  Service: Gastroenterology;  Laterality: N/A;   COLONOSCOPY WITH PROPOFOL N/A 06/01/2019   Procedure: COLONOSCOPY WITH PROPOFOL;  Surgeon: Wyline Mood, MD;  Location: [facility] ENDOSCOPY;  Service: Gastroenterology;  Laterality: N/A;   COLONOSCOPY WITH PROPOFOL N/A 04/02/2023   Procedure: COLONOSCOPY WITH PROPOFOL;  Surgeon: Toney Reil, MD;  Location: [facility] ENDOSCOPY;  Service: Gastroenterology;   Laterality: N/A;   LEFT HEART CATH AND CORONARY ANGIOGRAPHY Left 03/24/2020   Procedure: LEFT HEART CATH AND CORONARY ANGIOGRAPHY;  Surgeon: Lamar Blinks, MD;  Location: [facility] INVASIVE CV LAB;  Service: Cardiovascular;  Laterality: Left;   POLYPECTOMY  04/02/2023   Procedure: POLYPECTOMY;  Surgeon: Toney Reil, MD;  Location: [facility] ENDOSCOPY;  Service: Gastroenterology;;   POLYPECTOMY  04/03/2023   Procedure: POLYPECTOMY;  Surgeon: Toney Reil, MD;  Location: [facility] ENDOSCOPY;  Service: Gastroenterology;;    MEDICATIONS:  Prior to Admission medications   Not on File    Physical Exam   Triage Vital Signs: ED Triage Vitals  Encounter Vitals Group     BP 09/28/23 0145 (!) 148/94     Systolic BP Percentile --      Diastolic BP Percentile --      Pulse Rate 09/28/23 0145 93     Resp 09/28/23 0145 17     Temp 09/28/23 0145 98.5 F (36.9 C)     Temp Source 09/28/23 0145 Oral     SpO2 09/28/23 0145 100 %     Weight 09/28/23 0145 145 lb (65.8 kg)     Height 09/28/23 0145 5\' 4"  (1.626 m)     Head Circumference --      Peak Flow --      Pain Score 09/28/23 0154 6     Pain Loc --  Pain Education --      Exclude from Growth Chart --     Most recent vital signs: Vitals:   09/28/23 0145 09/28/23 0400  BP: (!) 148/94 (!) 135/97  Pulse: 93 92  Resp: 17   Temp: 98.5 F (36.9 C)   SpO2: 100% 100%     CONSTITUTIONAL: Alert and responds appropriately to questions. Well-appearing; well-nourished HEAD: Normocephalic, atraumatic EYES: Conjunctivae clear, pupils appear equal ENT: normal nose; moist mucous membranes NECK: Normal range of motion CARD: Regular rate and rhythm RESP: Normal chest excursion without splinting or tachypnea; no hypoxia or respiratory distress, speaking full sentences ABD/GI: non-distended GU: Patient has 5 mm x 5 mm red and slightly indurated area to the left labial area/mons without surrounding redness, warmth, fluctuance or induration  and no drainage EXT: Normal ROM in all joints, no major deformities noted SKIN: Normal color for age and race, no rashes on exposed skin NEURO: Moves all extremities equally, normal speech, no facial asymmetry noted PSYCH: The patient's mood and manner are appropriate. Grooming and personal hygiene are appropriate.  ED Results / Procedures / Treatments   LABS: (all labs ordered are listed, but only abnormal results are displayed) Labs Reviewed  URINALYSIS, ROUTINE W REFLEX MICROSCOPIC - Abnormal; Notable for the following components:      Result Value   Color, Urine STRAW (*)    APPearance CLEAR (*)    Specific Gravity, Urine 1.003 (*)    Hgb urine dipstick SMALL (*)    All other components within normal limits  CBG MONITORING, ED - Abnormal; Notable for the following components:   Glucose-Capillary 146 (*)    All other components within normal limits  URINE CULTURE     EKG:  EKG Interpretation Date/Time:    Ventricular Rate:    PR Interval:    QRS Duration:    QT Interval:    QTC Calculation:   R Axis:      Text Interpretation:            RADIOLOGY: My personal review and interpretation of imaging:    I have personally reviewed all radiology reports. No results found.   PROCEDURES:  Critical Care performed: No     Procedures    IMPRESSION / MDM / ASSESSMENT AND PLAN / ED COURSE  I reviewed the triage vital signs and the nursing notes.   Patient here with small abscess that has been recurrent.     DIFFERENTIAL DIAGNOSIS (includes but not limited to):   Abscess, no signs of cellulitis or necrotizing fasciitis.  Diabetes on the differential as well as UTI.  Patient's presentation is most consistent with acute complicated illness / injury requiring diagnostic workup.  PLAN: Abscess is extremely small and not amendable to drainage.  Will start her on Keflex.  Discussed that this would cover her for UTI for potential urinary symptoms.  Urinalysis  and culture pending.  Will also check her blood glucose because she has been told previously that she was a diabetic to ensure that this is not why she has this recurrent infection.  There is no sign of surrounding cellulitis, crepitus or other deformity.  She is well-appearing, afebrile here.   MEDICATIONS GIVEN IN ED: Medications  ibuprofen (ADVIL) tablet 800 mg (800 mg Oral Given 09/28/23 0330)  cephALEXin (KEFLEX) capsule 500 mg (500 mg Oral Given 09/28/23 0331)     ED COURSE: CBG in the 140s.  Urine shows no sign of infection but culture is pending.  Will discharge with Keflex and recommended alternating Tylenol, Motrin.  Will place PCP referral as well.   At this time, I do not feel there is any life-threatening condition present. I reviewed all nursing notes, vitals, pertinent previous records.  All lab and urine results, EKGs, imaging ordered have been independently reviewed and interpreted by myself.  I reviewed all available radiology reports from any imaging ordered this visit.  Based on my assessment, I feel the patient is safe to be discharged home without further emergent workup and can continue workup as an outpatient as needed. Discussed all findings, treatment plan as well as usual and customary return precautions.  They verbalize understanding and are comfortable with this plan.  Outpatient follow-up has been provided as needed.  All questions have been answered.    CONSULTS:  none   OUTSIDE RECORDS REVIEWED: Reviewed recent urgent care notes in January for UTI symptoms.     FINAL CLINICAL IMPRESSION(S) / ED DIAGNOSES   Final diagnoses:  Labial abscess     Rx / DC Orders   ED Discharge Orders          Ordered    Ambulatory Referral to Primary Care (Establish Care)        09/28/23 0304    cephALEXin (KEFLEX) 500 MG capsule  2 times daily        09/28/23 0354    ibuprofen (ADVIL) 800 MG tablet  Every 8 hours PRN        09/28/23 0354             Note:   This document was prepared using Dragon voice recognition software and may include unintentional dictation errors.   Madalene Mickler, Layla Maw, DO 09/28/23 [phone]

## 2023-09-29 LAB — URINE CULTURE: Culture: NO GROWTH

## 2023-11-02 ENCOUNTER — Encounter: Payer: Self-pay | Admitting: Emergency Medicine

## 2023-11-02 ENCOUNTER — Emergency Department: Payer: Self-pay

## 2023-11-02 ENCOUNTER — Emergency Department
Admission: EM | Admit: 2023-11-02 | Discharge: 2023-11-02 | Disposition: A | Discharge status: Home / Self Care | Payer: Self-pay | Attending: Emergency Medicine | Admitting: Emergency Medicine

## 2023-11-02 ENCOUNTER — Other Ambulatory Visit: Payer: Self-pay

## 2023-11-02 DIAGNOSIS — S39012A Strain of muscle, fascia and tendon of lower back, initial encounter: Secondary | ICD-10-CM

## 2023-11-02 DIAGNOSIS — Z8542 Personal history of malignant neoplasm of other parts of uterus: Secondary | ICD-10-CM | POA: Insufficient documentation

## 2023-11-02 DIAGNOSIS — S29012A Strain of muscle and tendon of back wall of thorax, initial encounter: Secondary | ICD-10-CM | POA: Insufficient documentation

## 2023-11-02 DIAGNOSIS — S0990XA Unspecified injury of head, initial encounter: Secondary | ICD-10-CM | POA: Insufficient documentation

## 2023-11-02 DIAGNOSIS — Z955 Presence of coronary angioplasty implant and graft: Secondary | ICD-10-CM | POA: Insufficient documentation

## 2023-11-02 DIAGNOSIS — J449 Chronic obstructive pulmonary disease, unspecified: Secondary | ICD-10-CM | POA: Insufficient documentation

## 2023-11-02 DIAGNOSIS — W19XXXA Unspecified fall, initial encounter: Secondary | ICD-10-CM

## 2023-11-02 DIAGNOSIS — W01198A Fall on same level from slipping, tripping and stumbling with subsequent striking against other object, initial encounter: Secondary | ICD-10-CM | POA: Insufficient documentation

## 2023-11-02 MED ORDER — HYDROCODONE-ACETAMINOPHEN 5-325 MG PO TABS
2.0000 | ORAL_TABLET | Freq: Once | ORAL | Status: AC
  Administered 2023-11-02: 2 via ORAL
  Filled 2023-11-02: qty 2

## 2023-11-02 MED ORDER — ONDANSETRON 4 MG PO TBDP: 4.0000 mg | ORAL_TABLET | Freq: Four times a day (QID) | ORAL | 0 refills | Status: DC | PRN

## 2023-11-02 MED ORDER — IBUPROFEN 800 MG PO TABS: 800.0000 mg | ORAL_TABLET | Freq: Three times a day (TID) | ORAL | 0 refills | Status: DC | PRN

## 2023-11-02 MED ORDER — ONDANSETRON 4 MG PO TBDP
4.0000 mg | ORAL_TABLET | Freq: Once | ORAL | Status: AC
  Administered 2023-11-02: 4 mg via ORAL
  Filled 2023-11-02: qty 1

## 2023-11-02 MED ORDER — IBUPROFEN 800 MG PO TABS
800.0000 mg | ORAL_TABLET | Freq: Once | ORAL | Status: AC
  Administered 2023-11-02: 800 mg via ORAL
  Filled 2023-11-02: qty 1

## 2023-11-02 MED ORDER — HYDROCODONE-ACETAMINOPHEN 5-325 MG PO TABS: 2.0000 | ORAL_TABLET | Freq: Three times a day (TID) | ORAL | 0 refills | Status: DC | PRN

## 2023-11-02 NOTE — ED Triage Notes (Addendum)
 Pt in after trip fall over her cat tonight, landed backward onto hardwood floor. Pt c/o posterior head/midline neck pain and low back pain, hx of some back surgeries. No thinners or LOC, states she 1 episode of emesis PTA

## 2023-11-02 NOTE — Discharge Instructions (Addendum)

## 2023-11-02 NOTE — ED Notes (Signed)
 C-collar applied

## 2023-11-02 NOTE — ED Provider Notes (Signed)
 [facility] Provider Note    Event Date/Time   First MD Initiated Contact with Patient 11/02/23 [phone]     (approximate)   History   Fall, Back Pain, and Head Injury   HPI  [name] is a 53 y.o. female with history of COPD, migraines, chronic back pain status post back surgeries who presents to the emergency department after a fall, head injury.  Reports she tripped over her cat and hit her head.  No loss of consciousness.  Not on blood thinners.  Complaining of diffuse back pain.  No numbness, tingling or weakness.  No preceding symptoms such as chest pain, shortness of breath, palpitations or dizziness that led to her fall.   History provided by patient, friend.    Past Medical History:  Diagnosis Date   Back pain    Cancer (HCC)    + CANCER CELLS TO UTERUS   COPD (chronic obstructive pulmonary disease) (HCC)    Family history of adverse reaction to anesthesia    FATHER STOPS BREATHING WITH ANESTHESIA   Headache    HX MIGRAINES   Seizures (HCC)    1 SEIZURE  VERY STRESSED    Past Surgical History:  Procedure Laterality Date   ABDOMINAL HYSTERECTOMY     APPENDECTOMY     BACK SURGERY  2016   COLONOSCOPY N/A 04/03/2023   Procedure: COLONOSCOPY;  Surgeon: Selena Daily, MD;  Location: [facility] ENDOSCOPY;  Service: Gastroenterology;  Laterality: N/A;   COLONOSCOPY WITH PROPOFOL  N/A 06/01/2019   Procedure: COLONOSCOPY WITH PROPOFOL ;  Surgeon: Luke Salaam, MD;  Location: [facility] ENDOSCOPY;  Service: Gastroenterology;  Laterality: N/A;   COLONOSCOPY WITH PROPOFOL  N/A 04/02/2023   Procedure: COLONOSCOPY WITH PROPOFOL ;  Surgeon: Selena Daily, MD;  Location: [facility] ENDOSCOPY;  Service: Gastroenterology;  Laterality: N/A;   LEFT HEART CATH AND CORONARY ANGIOGRAPHY Left 03/24/2020   Procedure: LEFT HEART CATH AND CORONARY ANGIOGRAPHY;  Surgeon: Michelle Aid, MD;  Location: [facility] INVASIVE CV LAB;  Service: Cardiovascular;  Laterality:  Left;   POLYPECTOMY  04/02/2023   Procedure: POLYPECTOMY;  Surgeon: Selena Daily, MD;  Location: [facility] ENDOSCOPY;  Service: Gastroenterology;;   POLYPECTOMY  04/03/2023   Procedure: POLYPECTOMY;  Surgeon: Selena Daily, MD;  Location: [facility] ENDOSCOPY;  Service: Gastroenterology;;    MEDICATIONS:  Prior to Admission medications   Medication Sig Start Date End Date Taking? Authorizing Provider  cephALEXin  (KEFLEX ) 500 MG capsule Take 1 capsule (500 mg total) by mouth 2 (two) times daily. 09/28/23   Kalan Rinn, Clover Dao, DO  ibuprofen  (ADVIL ) 800 MG tablet Take 1 tablet (800 mg total) by mouth every 8 (eight) hours as needed. 09/28/23   Maddyx Wieck, Clover Dao, DO    Physical Exam   Triage Vital Signs: ED Triage Vitals [11/02/23 0217]  Encounter Vitals Group     BP (!) 185/94     Systolic BP Percentile      Diastolic BP Percentile      Pulse Rate 78     Resp 20     Temp 98.3 F (36.8 C)     Temp Source Oral     SpO2 99 %     Weight 145 lb 1 oz (65.8 kg)     Height      Head Circumference      Peak Flow      Pain Score 8     Pain Loc      Pain Education  Exclude from Growth Chart     Most recent vital signs: Vitals:   11/02/23 0217 11/02/23 0538  BP: (!) 185/94 110/68  Pulse: 78 (!) 56  Resp: 20 18  Temp: 98.3 F (36.8 C)   SpO2: 99% 100%     CONSTITUTIONAL: Alert, responds appropriately to questions. Well-appearing; well-nourished; GCS 15 HEAD: Normocephalic; atraumatic EYES: Conjunctivae clear, PERRL, EOMI ENT: normal nose; no rhinorrhea; moist mucous membranes; pharynx without lesions noted; no dental injury; no septal hematoma, no epistaxis; no facial deformity or bony tenderness NECK: Supple, no midline spinal tenderness, step-off or deformity; trachea midline CARD: RRR; S1 and S2 appreciated; no murmurs, no clicks, no rubs, no gallops RESP: Normal chest excursion without splinting or tachypnea; breath sounds clear and equal bilaterally; no wheezes, no rhonchi,  no rales; no hypoxia or respiratory distress CHEST:  chest wall stable, no crepitus or ecchymosis or deformity, nontender to palpation; no flail chest ABD/GI: Non-distended; soft, non-tender, no rebound, no guarding; no ecchymosis or other lesions noted PELVIS:  stable, nontender to palpation BACK:  The back appears normal; tender throughout the cervical, thoracic and lumbar spine without step-off or deformity EXT: Normal ROM in all joints; no edema; normal capillary refill; no cyanosis, no bony tenderness or bony deformity of patient's extremities, no joint effusions, compartments are soft, extremities are warm and well-perfused, no ecchymosis SKIN: Normal color for age and race; warm NEURO: No facial asymmetry, normal speech, moving all extremities equally, normal sensation diffusely  ED Results / Procedures / Treatments   LABS: (all labs ordered are listed, but only abnormal results are displayed) Labs Reviewed - No data to display   EKG:  EKG Interpretation Date/Time:    Ventricular Rate:    PR Interval:    QRS Duration:    QT Interval:    QTC Calculation:   R Axis:      Text Interpretation:            RADIOLOGY: My personal review and interpretation of imaging: CT head, cervical spine, x-ray of the thoracic and lumbar spine showed no acute traumatic injury.  I have personally reviewed all radiology reports. DG Lumbar Spine Complete Result Date: 11/02/2023 CLINICAL DATA:  Fall.  Back pain. EXAM: LUMBAR SPINE - COMPLETE 4+ VIEW COMPARISON:  CT from 01/08/2023 FINDINGS: Status post PLIF at L4-5. The alignment of the lumbar spine appears normal. The vertebral body heights are well preserved. No signs of acute fracture or subluxation. IMPRESSION: 1. No acute findings. 2. Status post PLIF at L4-5. Electronically Signed   By: Kimberley Penman M.D.   On: 11/02/2023 05:06   DG Thoracic Spine 2 View Result Date: 11/02/2023 CLINICAL DATA:  Status post fall.  Complains of back pain.  EXAM: THORACIC SPINE 2 VIEWS COMPARISON:  None Available. FINDINGS: The alignment of the thoracic spine appears normal. The vertebral body heights are well preserved. No signs of acute fracture. IMPRESSION: Negative. Electronically Signed   By: Kimberley Penman M.D.   On: 11/02/2023 05:05   CT Head Wo Contrast Result Date: 11/02/2023 CLINICAL DATA:  Head trauma, moderate-severe; Neck trauma, midline tenderness (Age 81-64y) . Pt in after trip fall over her cat tonight, landed backward onto hardwood floor. Pt c/o posterior head/midline neck pain EXAM: CT HEAD WITHOUT CONTRAST CT CERVICAL SPINE WITHOUT CONTRAST TECHNIQUE: Multidetector CT imaging of the head and cervical spine was performed following the standard protocol without intravenous contrast. Multiplanar CT image reconstructions of the cervical spine were also generated. RADIATION DOSE REDUCTION:  This exam was performed according to the departmental dose-optimization program which includes automated exposure control, adjustment of the mA and/or kV according to patient size and/or use of iterative reconstruction technique. COMPARISON:  CT head and C-spine 08/26/2021 FINDINGS: CT HEAD FINDINGS Brain: No evidence of large-territorial acute infarction. No parenchymal hemorrhage. No mass lesion. No extra-axial collection. No mass effect or midline shift. No hydrocephalus. Basilar cisterns are patent. Vascular: No hyperdense vessel. Atherosclerotic calcifications are present within the cavernous internal carotid and vertebral arteries. Skull: No acute fracture or focal lesion. Sinuses/Orbits: Paranasal sinuses and mastoid air cells are clear. The orbits are unremarkable. Other: None. CT CERVICAL SPINE FINDINGS Alignment: Normal. Skull base and vertebrae: No acute fracture. No aggressive appearing focal osseous lesion or focal pathologic process. Soft tissues and spinal canal: No prevertebral fluid or swelling. No visible canal hematoma. Upper chest: Trace biapical  paraseptal emphysematous changes. Other: None. IMPRESSION: 1. No acute intracranial abnormality. 2. No acute displaced fracture or traumatic listhesis of the cervical spine. 3.  Emphysema (ICD10-J43.9). Electronically Signed   By: Morgane  Naveau M.D.   On: 11/02/2023 02:47   CT Cervical Spine Wo Contrast Result Date: 11/02/2023 CLINICAL DATA:  Head trauma, moderate-severe; Neck trauma, midline tenderness (Age 52-64y) . Pt in after trip fall over her cat tonight, landed backward onto hardwood floor. Pt c/o posterior head/midline neck pain EXAM: CT HEAD WITHOUT CONTRAST CT CERVICAL SPINE WITHOUT CONTRAST TECHNIQUE: Multidetector CT imaging of the head and cervical spine was performed following the standard protocol without intravenous contrast. Multiplanar CT image reconstructions of the cervical spine were also generated. RADIATION DOSE REDUCTION: This exam was performed according to the departmental dose-optimization program which includes automated exposure control, adjustment of the mA and/or kV according to patient size and/or use of iterative reconstruction technique. COMPARISON:  CT head and C-spine 08/26/2021 FINDINGS: CT HEAD FINDINGS Brain: No evidence of large-territorial acute infarction. No parenchymal hemorrhage. No mass lesion. No extra-axial collection. No mass effect or midline shift. No hydrocephalus. Basilar cisterns are patent. Vascular: No hyperdense vessel. Atherosclerotic calcifications are present within the cavernous internal carotid and vertebral arteries. Skull: No acute fracture or focal lesion. Sinuses/Orbits: Paranasal sinuses and mastoid air cells are clear. The orbits are unremarkable. Other: None. CT CERVICAL SPINE FINDINGS Alignment: Normal. Skull base and vertebrae: No acute fracture. No aggressive appearing focal osseous lesion or focal pathologic process. Soft tissues and spinal canal: No prevertebral fluid or swelling. No visible canal hematoma. Upper chest: Trace biapical  paraseptal emphysematous changes. Other: None. IMPRESSION: 1. No acute intracranial abnormality. 2. No acute displaced fracture or traumatic listhesis of the cervical spine. 3.  Emphysema (ICD10-J43.9). Electronically Signed   By: Morgane  Naveau M.D.   On: 11/02/2023 02:47     PROCEDURES:  Critical Care performed: No   CRITICAL CARE Performed by: Starling Eck Dolton Shaker   Total critical care time: 0 minutes  Critical care time was exclusive of separately billable procedures and treating other patients.  Critical care was necessary to treat or prevent imminent or life-threatening deterioration.  Critical care was time spent personally by me on the following activities: development of treatment plan with patient and/or surrogate as well as nursing, discussions with consultants, evaluation of patient's response to treatment, examination of patient, obtaining history from patient or surrogate, ordering and performing treatments and interventions, ordering and review of laboratory studies, ordering and review of radiographic studies, pulse oximetry and re-evaluation of patient's condition.   Procedures    IMPRESSION / MDM / ASSESSMENT  AND PLAN / ED COURSE  I reviewed the triage vital signs and the nursing notes.  Patient here after mechanical fall.  Complaining of head injury, diffuse neck and back pain.  No focal neurodeficits.     DIFFERENTIAL DIAGNOSIS (includes but not limited to):   Concussion, intracranial hemorrhage, skull fracture, cervical spine fracture, spinal fracture, back strain, spasm  Patient's presentation is most consistent with acute presentation with potential threat to life or bodily function.  PLAN: CT of the head, cervical spine obtained from triage and reviewed/interpreted by myself and the radiologist are unremarkable.  Will obtain x-rays of thoracic and lumbar spine.  Neurologically intact.  No other sign of traumatic injury on exam.  Will give pain medication  here.   MEDICATIONS GIVEN IN ED: Medications  HYDROcodone -acetaminophen  (NORCO/VICODIN) 5-325 MG per tablet 2 tablet (2 tablets Oral Given 11/02/23 0418)  ondansetron  (ZOFRAN -ODT) disintegrating tablet 4 mg (4 mg Oral Given 11/02/23 0418)  ibuprofen  (ADVIL ) tablet 800 mg (800 mg Oral Given 11/02/23 0418)     ED COURSE: X-rays reviewed and interpreted by myself and the radiologist and show no acute abnormality.  Pain has been well-controlled with pain medication here.  Will discharge with prescription of the same.  Recommend alternating heat, ice, stretching, rest.  She has a PCP for follow-up.  Friend at bedside to take her home.   At this time, I do not feel there is any life-threatening condition present. I reviewed all nursing notes, vitals, pertinent previous records.  All lab and urine results, EKGs, imaging ordered have been independently reviewed and interpreted by myself.  I reviewed all available radiology reports from any imaging ordered this visit.  Based on my assessment, I feel the patient is safe to be discharged home without further emergent workup and can continue workup as an outpatient as needed. Discussed all findings, treatment plan as well as usual and customary return precautions.  They verbalize understanding and are comfortable with this plan.  Outpatient follow-up has been provided as needed.  All questions have been answered.    CONSULTS:  none   OUTSIDE RECORDS REVIEWED: Reviewed last PCP note in 2023.       FINAL CLINICAL IMPRESSION(S) / ED DIAGNOSES   Final diagnoses:  Fall, initial encounter  Injury of head, initial encounter  Back strain, initial encounter     Rx / DC Orders   ED Discharge Orders          Ordered    ibuprofen  (ADVIL ) 800 MG tablet  Every 8 hours PRN        11/02/23 0519    HYDROcodone -acetaminophen  (NORCO/VICODIN) 5-325 MG tablet  Every 8 hours PRN        11/02/23 0519    ondansetron  (ZOFRAN -ODT) 4 MG disintegrating tablet   Every 6 hours PRN        11/02/23 0519             Note:  This document was prepared using Dragon voice recognition software and may include unintentional dictation errors.   Arnelle Nale, Clover Dao, DO 11/02/23 2354

## 2024-02-27 ENCOUNTER — Encounter: Payer: Self-pay | Admitting: Emergency Medicine

## 2024-02-27 DIAGNOSIS — R350 Frequency of micturition: Secondary | ICD-10-CM | POA: Insufficient documentation

## 2024-02-27 DIAGNOSIS — Z5321 Procedure and treatment not carried out due to patient leaving prior to being seen by health care provider: Secondary | ICD-10-CM | POA: Insufficient documentation

## 2024-02-27 DIAGNOSIS — R110 Nausea: Secondary | ICD-10-CM | POA: Insufficient documentation

## 2024-02-27 DIAGNOSIS — R519 Headache, unspecified: Secondary | ICD-10-CM | POA: Insufficient documentation

## 2024-02-27 LAB — URINALYSIS, ROUTINE W REFLEX MICROSCOPIC
Bacteria, UA: NONE SEEN
Bilirubin Urine: NEGATIVE
Glucose, UA: NEGATIVE mg/dL
Hgb urine dipstick: NEGATIVE
Ketones, ur: 5 mg/dL — AB
Nitrite: NEGATIVE
Protein, ur: 30 mg/dL — AB
Specific Gravity, Urine: 1.03 (ref 1.005–1.030)
WBC, UA: >50 WBC/hpf (ref 0–5)
pH: 5 (ref 5.0–8.0)

## 2024-02-27 LAB — RESP PANEL BY RT-PCR (RSV, FLU A&B, COVID)  RVPGX2
Influenza A by PCR: NEGATIVE
Influenza B by PCR: NEGATIVE
Resp Syncytial Virus by PCR: NEGATIVE
SARS Coronavirus 2 by RT PCR: NEGATIVE

## 2024-02-27 NOTE — ED Triage Notes (Addendum)
 Pt complains of headache x1 week. Last night was feeling head and felt a soft spot on the top of head. Denies any injury. Nausea all week without vomiting. Photosensitivity. Took aleve prior to coming to ER for pain 10/10. Pt also complains for urinary frequency for several weeks. No burning.

## 2024-02-28 ENCOUNTER — Emergency Department
Admission: EM | Admit: 2024-02-28 | Discharge: 2024-02-28 | Discharge status: Against Medical Advice | Payer: Self-pay | Attending: Emergency Medicine | Admitting: Emergency Medicine

## 2024-02-28 NOTE — ED Notes (Signed)
 Patient says she has to work tomorrow and is leaving at this time. Advised can return at any point if she would like

## 2024-03-15 ENCOUNTER — Emergency Department
Admission: EM | Admit: 2024-03-15 | Discharge: 2024-03-15 | Disposition: A | Discharge status: Home / Self Care | Payer: Self-pay | Attending: Emergency Medicine | Admitting: Emergency Medicine

## 2024-03-15 ENCOUNTER — Other Ambulatory Visit: Payer: Self-pay

## 2024-03-15 ENCOUNTER — Emergency Department: Payer: Self-pay

## 2024-03-15 DIAGNOSIS — J449 Chronic obstructive pulmonary disease, unspecified: Secondary | ICD-10-CM | POA: Insufficient documentation

## 2024-03-15 DIAGNOSIS — I10 Essential (primary) hypertension: Secondary | ICD-10-CM | POA: Insufficient documentation

## 2024-03-15 DIAGNOSIS — E876 Hypokalemia: Secondary | ICD-10-CM | POA: Insufficient documentation

## 2024-03-15 DIAGNOSIS — R519 Headache, unspecified: Secondary | ICD-10-CM | POA: Insufficient documentation

## 2024-03-15 DIAGNOSIS — I2510 Atherosclerotic heart disease of native coronary artery without angina pectoris: Secondary | ICD-10-CM | POA: Insufficient documentation

## 2024-03-15 DIAGNOSIS — G8929 Other chronic pain: Secondary | ICD-10-CM

## 2024-03-15 LAB — BASIC METABOLIC PANEL WITH GFR
Anion gap: 8 (ref 5–15)
BUN: 8 mg/dL (ref 6–20)
CO2: 26 mmol/L (ref 22–32)
Calcium: 9.1 mg/dL (ref 8.9–10.3)
Chloride: 102 mmol/L (ref 98–111)
Creatinine, Ser: 0.91 mg/dL (ref 0.44–1.00)
GFR, Estimated: >60 mL/min (ref >60)
Glucose, Bld: 164 mg/dL — ABNORMAL HIGH (ref 70–99)
Potassium: 3.1 mmol/L — ABNORMAL LOW (ref 3.5–5.1)
Sodium: 136 mmol/L (ref 135–145)

## 2024-03-15 LAB — URINALYSIS, ROUTINE W REFLEX MICROSCOPIC
Bilirubin Urine: NEGATIVE
Glucose, UA: NEGATIVE mg/dL
Hgb urine dipstick: NEGATIVE
Ketones, ur: NEGATIVE mg/dL
Leukocytes,Ua: NEGATIVE
Nitrite: NEGATIVE
Protein, ur: NEGATIVE mg/dL
Specific Gravity, Urine: 1.01 (ref 1.005–1.030)
pH: 6 (ref 5.0–8.0)

## 2024-03-15 LAB — CBC
HCT: 38.6 % (ref 36.0–46.0)
Hemoglobin: 12.8 g/dL (ref 12.0–15.0)
MCH: 31.4 pg (ref 26.0–34.0)
MCHC: 33.2 g/dL (ref 30.0–36.0)
MCV: 94.6 fL (ref 80.0–100.0)
Platelets: 312 K/uL (ref 150–400)
RBC: 4.08 MIL/uL (ref 3.87–5.11)
RDW: 12.6 % (ref 11.5–15.5)
WBC: 9.8 K/uL (ref 4.0–10.5)
nRBC: 0 % (ref 0.0–0.2)

## 2024-03-15 MED ORDER — POTASSIUM CHLORIDE CRYS ER 20 MEQ PO TBCR
40.0000 meq | EXTENDED_RELEASE_TABLET | Freq: Once | ORAL | Status: AC
  Administered 2024-03-15: 40 meq via ORAL
  Filled 2024-03-15: qty 2

## 2024-03-15 NOTE — ED Triage Notes (Signed)
 Pt to ED via POV from home. Pt reports intermittent HA since 9/5. Pt was triage but LWBS due to wait. Pt reports was looking on mychart for her results and concerned with her urine results and having hx of kidney disease in family. Denies abd pain or some burning with urination.

## 2024-03-15 NOTE — ED Provider Notes (Signed)
 [facility] Provider Note    Event Date/Time   First MD Initiated Contact with Patient 03/15/24 1556     (approximate)   History   No chief complaint on file.   HPI  [name] is a 53 y.o. female with PMH of headaches, seizures, COPD, CAD, hypertension presents for evaluation of headache and is also concerned about kidney disease.  Patient states that she has had intermittent headaches over the last 2 months.  Reports that she gets relief when taking Aleve.  She states that she has had migraines before and this is not similar to a migraine.  It is not nearly as severe as her migraines.  Patient presented to the emergency department a couple weeks ago and left without being seen due to long wait times.  She had a urinalysis completed and there were some abnormalities on it and she is concerned about these findings as she has a history of kidney disease in her family.  Patient denies urinary symptoms.      Physical Exam   Triage Vital Signs: ED Triage Vitals  Encounter Vitals Group     BP 03/15/24 1249 (!) 154/111     Girls Systolic BP Percentile --      Girls Diastolic BP Percentile --      Boys Systolic BP Percentile --      Boys Diastolic BP Percentile --      Pulse Rate 03/15/24 1249 86     Resp 03/15/24 1249 18     Temp 03/15/24 1249 98.1 F (36.7 C)     Temp Source 03/15/24 1249 Oral     SpO2 03/15/24 1249 96 %     Weight --      Height --      Head Circumference --      Peak Flow --      Pain Score 03/15/24 1250 6     Pain Loc --      Pain Education --      Exclude from Growth Chart --     Most recent vital signs: Vitals:   03/15/24 1249  BP: (!) 154/111  Pulse: 86  Resp: 18  Temp: 98.1 F (36.7 C)  SpO2: 96%    General: Awake, no distress.  CV:  Good peripheral perfusion.  RRR. Resp:  Normal effort.  CTAB. Abd:  No distention.  Other:  PERRL, EOM intact, no focal neurodeficits, no ataxia, no pronator drift.   ED  Results / Procedures / Treatments   Labs (all labs ordered are listed, but only abnormal results are displayed) Labs Reviewed  URINALYSIS, ROUTINE W REFLEX MICROSCOPIC - Abnormal; Notable for the following components:      Result Value   Color, Urine YELLOW (*)    APPearance HAZY (*)    All other components within normal limits  BASIC METABOLIC PANEL WITH GFR - Abnormal; Notable for the following components:   Potassium 3.1 (*)    Glucose, Bld 164 (*)    All other components within normal limits  CBC    RADIOLOGY  CT head obtained, I interpreted the images as well as reviewed the radiologist report which was negative for acute abnormalities.  PROCEDURES:  Critical Care performed: No  Procedures   MEDICATIONS ORDERED IN ED: Medications  potassium chloride  SA (KLOR-CON  M) CR tablet 40 mEq (40 mEq Oral Given 03/15/24 1631)     IMPRESSION / MDM / ASSESSMENT AND PLAN / ED COURSE  I  reviewed the triage vital signs and the nursing notes.                             53 year old female presents for evaluation of intermittent headache.  Blood pressure is elevated otherwise vital signs are stable.  Patient NAD on exam.  Differential diagnosis includes, but is not limited to, tension headache, migraine, depression, dehydration, sleep deprivation.  Patient's presentation is most consistent with acute complicated illness / injury requiring diagnostic workup.  CBC unremarkable.  BMP shows mild hypokalemia otherwise within normal limits.  Urinalysis normal.  Patient expressed concerns about her kidney function based on some previous lab work that had been done.  Did review her results today and she was given reassurance that I do not see evidence of kidney disease at this time.  Did advise her to get established with a primary care provider so they can further monitor this.  She was given a dose of potassium chloride  while in the ED to address the hypokalemia.  In regards to  patient's headache, suspect this is due to a tension headache.  Patient reports she has had migraines before and it does not feel similar.  Her CT scan was normal and there are no focal findings on neuro exam.  Patient endorses multiple stressful events in her life.  We discussed taking Tylenol  and ibuprofen  as needed.  Also encouraged her to make sure she is drinking lots of water, getting good rest and is eating healthy meals.  She expressed concerns for depression and would like to see a counselor so I provided her with follow-up resources for [facility].  Patient voiced understanding, all questions were answered and she was stable at discharge.      FINAL CLINICAL IMPRESSION(S) / ED DIAGNOSES   Final diagnoses:  Chronic nonintractable headache, unspecified headache type     Rx / DC Orders   ED Discharge Orders     None        Note:  This document was prepared using Dragon voice recognition software and may include unintentional dictation errors.   Cleaster Tinnie LABOR, PA-C 03/15/24 2322    Floy Roberts, MD 03/16/24 [phone]

## 2024-03-15 NOTE — Discharge Instructions (Addendum)
 The CT scan of your head was normal today.  Your blood work was also normal aside from a low potassium so you were given a potassium supplement today.  Your urinalysis looked good, no evidence of kidney disease at this time.  I have attached information for primary care providers in the area.  Please call to schedule.  I also listed information for behavioral health services below.  You can take 650 mg of Tylenol  and 600 mg of ibuprofen  every 6 hours as needed for pain. You can use ice, heat, muscle creams and other topical pain relievers as well.  [facility] [address], [place], [place] [address] [phone]  Return to the ED with any worsening symptoms.

## 2024-06-09 ENCOUNTER — Observation Stay
Admission: EM | Admit: 2024-06-09 | Discharge: 2024-06-10 | Disposition: A | Discharge status: Home / Self Care | Payer: Self-pay | Source: Home / Self Care | Attending: Emergency Medicine | Admitting: Emergency Medicine

## 2024-06-09 ENCOUNTER — Other Ambulatory Visit: Payer: Self-pay

## 2024-06-09 ENCOUNTER — Emergency Department: Payer: Self-pay

## 2024-06-09 DIAGNOSIS — J449 Chronic obstructive pulmonary disease, unspecified: Secondary | ICD-10-CM | POA: Insufficient documentation

## 2024-06-09 DIAGNOSIS — Z1152 Encounter for screening for COVID-19: Secondary | ICD-10-CM | POA: Insufficient documentation

## 2024-06-09 DIAGNOSIS — Z794 Long term (current) use of insulin: Secondary | ICD-10-CM | POA: Insufficient documentation

## 2024-06-09 DIAGNOSIS — I2510 Atherosclerotic heart disease of native coronary artery without angina pectoris: Secondary | ICD-10-CM | POA: Insufficient documentation

## 2024-06-09 DIAGNOSIS — E119 Type 2 diabetes mellitus without complications: Secondary | ICD-10-CM | POA: Insufficient documentation

## 2024-06-09 DIAGNOSIS — E876 Hypokalemia: Secondary | ICD-10-CM

## 2024-06-09 DIAGNOSIS — R0789 Other chest pain: Principal | ICD-10-CM | POA: Insufficient documentation

## 2024-06-09 DIAGNOSIS — I5A Non-ischemic myocardial injury (non-traumatic): Secondary | ICD-10-CM

## 2024-06-09 DIAGNOSIS — Z79899 Other long term (current) drug therapy: Secondary | ICD-10-CM | POA: Insufficient documentation

## 2024-06-09 DIAGNOSIS — U071 COVID-19: Secondary | ICD-10-CM

## 2024-06-09 DIAGNOSIS — R350 Frequency of micturition: Secondary | ICD-10-CM

## 2024-06-09 DIAGNOSIS — R7989 Other specified abnormal findings of blood chemistry: Secondary | ICD-10-CM

## 2024-06-09 DIAGNOSIS — E785 Hyperlipidemia, unspecified: Secondary | ICD-10-CM | POA: Diagnosis present

## 2024-06-09 DIAGNOSIS — Z7901 Long term (current) use of anticoagulants: Secondary | ICD-10-CM | POA: Insufficient documentation

## 2024-06-09 DIAGNOSIS — F17210 Nicotine dependence, cigarettes, uncomplicated: Secondary | ICD-10-CM | POA: Insufficient documentation

## 2024-06-09 DIAGNOSIS — R079 Chest pain, unspecified: Secondary | ICD-10-CM | POA: Diagnosis present

## 2024-06-09 LAB — CBC
HCT: 40.9 % (ref 36.0–46.0)
Hemoglobin: 13.3 g/dL (ref 12.0–15.0)
MCH: 31.5 pg (ref 26.0–34.0)
MCHC: 32.5 g/dL (ref 30.0–36.0)
MCV: 96.9 fL (ref 80.0–100.0)
Platelets: 285 K/uL (ref 150–400)
RBC: 4.22 MIL/uL (ref 3.87–5.11)
RDW: 13.1 % (ref 11.5–15.5)
WBC: 7.8 K/uL (ref 4.0–10.5)
nRBC: 0 % (ref 0.0–0.2)

## 2024-06-09 LAB — BASIC METABOLIC PANEL WITH GFR
Anion gap: 14 (ref 5–15)
BUN: 8 mg/dL (ref 6–20)
CO2: 23 mmol/L (ref 22–32)
Calcium: 9.1 mg/dL (ref 8.9–10.3)
Chloride: 102 mmol/L (ref 98–111)
Creatinine, Ser: 0.73 mg/dL (ref 0.44–1.00)
GFR, Estimated: >60 mL/min (ref >60)
Glucose, Bld: 288 mg/dL — ABNORMAL HIGH (ref 70–99)
Potassium: 3.4 mmol/L — ABNORMAL LOW (ref 3.5–5.1)
Sodium: 139 mmol/L (ref 135–145)

## 2024-06-09 LAB — TROPONIN T, HIGH SENSITIVITY
Troponin T High Sensitivity: <15 ng/L (ref 0–19)
Troponin T High Sensitivity: 37 ng/L — ABNORMAL HIGH (ref 0–19)

## 2024-06-09 LAB — D-DIMER, QUANTITATIVE: D-Dimer, Quant: 0.3 ug{FEU}/mL (ref 0.00–0.50)

## 2024-06-09 MED ORDER — MAGNESIUM HYDROXIDE 400 MG/5ML PO SUSP: 30.0000 mL | Freq: Every day | ORAL | Status: DC | PRN

## 2024-06-09 MED ORDER — ASPIRIN 81 MG PO CHEW
324.0000 mg | CHEWABLE_TABLET | Freq: Once | ORAL | Status: AC
  Administered 2024-06-09: 22:00:00 324 mg via ORAL
  Filled 2024-06-09: qty 4

## 2024-06-09 MED ORDER — ACETAMINOPHEN 325 MG PO TABS
650.0000 mg | ORAL_TABLET | ORAL | Status: DC | PRN
  Administered 2024-06-10: 16:00:00 650 mg via ORAL
  Filled 2024-06-09: qty 2

## 2024-06-09 MED ORDER — ENOXAPARIN SODIUM 40 MG/0.4ML IJ SOSY
40.0000 mg | PREFILLED_SYRINGE | INTRAMUSCULAR | Status: DC
  Administered 2024-06-09: 40 mg via SUBCUTANEOUS
  Filled 2024-06-09: qty 0.4

## 2024-06-09 MED ORDER — DIPHENHYDRAMINE HCL 50 MG/ML IJ SOLN: 50.0000 mg | Freq: Once | INTRAMUSCULAR | Status: DC

## 2024-06-09 MED ORDER — TRAZODONE HCL 50 MG PO TABS: 25.0000 mg | ORAL_TABLET | Freq: Every evening | ORAL | Status: DC | PRN

## 2024-06-09 MED ORDER — SODIUM CHLORIDE 0.9 % IV SOLN: INTRAVENOUS | Status: DC

## 2024-06-09 MED ORDER — METHYLPREDNISOLONE SODIUM SUCC 40 MG IJ SOLR
40.0000 mg | Freq: Once | INTRAMUSCULAR | Status: AC
  Administered 2024-06-09: 20:00:00 40 mg via INTRAVENOUS
  Filled 2024-06-09: qty 1

## 2024-06-09 MED ORDER — ONDANSETRON HCL 4 MG/2ML IJ SOLN: 4.0000 mg | Freq: Four times a day (QID) | INTRAMUSCULAR | Status: DC | PRN

## 2024-06-09 MED ORDER — DIPHENHYDRAMINE HCL 25 MG PO CAPS: 50.0000 mg | ORAL_CAPSULE | Freq: Once | ORAL | Status: DC

## 2024-06-09 MED ORDER — ALPRAZOLAM 0.25 MG PO TABS: 0.2500 mg | ORAL_TABLET | Freq: Two times a day (BID) | ORAL | Status: DC | PRN

## 2024-06-09 NOTE — Assessment & Plan Note (Signed)
-   No current exacerbation. - Will place on as needed albuterol  MDI.

## 2024-06-09 NOTE — ED Notes (Signed)
 Pt moved to room 1.  Report off to sydni rn.

## 2024-06-09 NOTE — H&P (Addendum)
 [facility]   PATIENT NAME: [name]    MR#:  [id number]  DATE OF BIRTH:  [birth year]  DATE OF ADMISSION:  06/09/2024  PRIMARY CARE PHYSICIAN: Pcp, No   Patient is coming from: Home  REQUESTING/REFERRING PHYSICIAN: Floy Roberts, MD  CHIEF COMPLAINT:   Chief Complaint  Patient presents with   Chest Pain    HISTORY OF PRESENT ILLNESS:  Kathrynne Kulinski is a 53 y.o. Caucasian female with medical history significant for COPD, seizures and back pain, who presented to the emergency room with acute onset of chest pain worsening with deep breathing.  She describes it as stinging and stabbing constant pain graded 8/10 in severity with associated mild dyspnea and nausea.  She was diagnosed with COVID-19 yesterday.  She has been having generalized fatigue and weakness since Sunday.  She admitted to nausea and vomiting intermittently as well as sore throat.  She denied any diarrhea or bilious vomitus or hematemesis, melena or bright red bleeding per rectum or abdominal pain.  No cough or wheezing.  She admitted to dyspnea with her symptoms.  No fever or chills.  No dysuria, oliguria or hematuria or flank pain.  No headache or dizziness or blurred vision.  No bleeding diathesis.  ED Course: When the patient came to the ER, heart rate was 105 with otherwise normal vital signs.  Labs revealed hypokalemia of 3.4 with blood glucose of 288 and otherwise unremarkable BMP.  High-sensitivity troponin T was less than 15 and later 27.  CBC was normal.  D-dimer was negative at 0.3. EKG as reviewed by me : EKG showed sinus tachycardia with rate of 108 with T wave inversion inferolaterally. Imaging: 2 view chest x-ray showed no acute cardiopulmonary disease.  The patient was given 4 baby aspirin  and 40 mg of IV Solu-Medrol .  She will be admitted to an observation telemetry bed for further evaluation and management. PAST MEDICAL HISTORY:   Past Medical History:  Diagnosis Date   Back pain     Cancer (HCC)    + CANCER CELLS TO UTERUS   COPD (chronic obstructive pulmonary disease) (HCC)    Family history of adverse reaction to anesthesia    FATHER STOPS BREATHING WITH ANESTHESIA   Headache    HX MIGRAINES   Seizures (HCC)    1 SEIZURE  VERY STRESSED    PAST SURGICAL HISTORY:   Past Surgical History:  Procedure Laterality Date   ABDOMINAL HYSTERECTOMY     APPENDECTOMY     BACK SURGERY  2016   COLONOSCOPY N/A 04/03/2023   Procedure: COLONOSCOPY;  Surgeon: Unk Corinn Skiff, MD;  Location: [facility] ENDOSCOPY;  Service: Gastroenterology;  Laterality: N/A;   COLONOSCOPY WITH PROPOFOL  N/A 06/01/2019   Procedure: COLONOSCOPY WITH PROPOFOL ;  Surgeon: Therisa Bi, MD;  Location: [facility] ENDOSCOPY;  Service: Gastroenterology;  Laterality: N/A;   COLONOSCOPY WITH PROPOFOL  N/A 04/02/2023   Procedure: COLONOSCOPY WITH PROPOFOL ;  Surgeon: Unk Corinn Skiff, MD;  Location: [facility] ENDOSCOPY;  Service: Gastroenterology;  Laterality: N/A;   LEFT HEART CATH AND CORONARY ANGIOGRAPHY Left 03/24/2020   Procedure: LEFT HEART CATH AND CORONARY ANGIOGRAPHY;  Surgeon: Hester Wolm PARAS, MD;  Location: [facility] INVASIVE CV LAB;  Service: Cardiovascular;  Laterality: Left;   POLYPECTOMY  04/02/2023   Procedure: POLYPECTOMY;  Surgeon: Unk Corinn Skiff, MD;  Location: [facility] ENDOSCOPY;  Service: Gastroenterology;;   POLYPECTOMY  04/03/2023   Procedure: POLYPECTOMY;  Surgeon: Unk Corinn Skiff, MD;  Location: [facility] ENDOSCOPY;  Service: Gastroenterology;;  SOCIAL HISTORY:   Social History   Tobacco Use   Smoking status: Every Day    Current packs/day: 0.50    Average packs/day: 0.5 packs/day for 28.0 years (14.0 ttl pk-yrs)    Types: Cigarettes    Passive exposure: Past   Smokeless tobacco: Never   Tobacco comments:    Half a pack  Substance Use Topics   Alcohol use: No    Alcohol/week: 0.0 standard drinks of alcohol    FAMILY HISTORY:   Family History  Problem Relation Age of Onset    Heart attack Father    Lupus Sister    Breast cancer Maternal Grandmother 50   Lupus Maternal Grandfather     DRUG ALLERGIES:  Allergies[1]  REVIEW OF SYSTEMS:   ROS As per history of present illness. All pertinent systems were reviewed above. Constitutional, HEENT, cardiovascular, respiratory, GI, GU, musculoskeletal, neuro, psychiatric, endocrine, integumentary and hematologic systems were reviewed and are otherwise negative/unremarkable except for positive findings mentioned above in the HPI.   MEDICATIONS AT HOME:   Prior to Admission medications  Medication Sig Start Date End Date Taking? Authorizing Provider  HYDROcodone -acetaminophen  (NORCO/VICODIN) 5-325 MG tablet Take 2 tablets by mouth every 8 (eight) hours as needed. 11/02/23   Ward, Josette SAILOR, DO  ibuprofen  (ADVIL ) 800 MG tablet Take 1 tablet (800 mg total) by mouth every 8 (eight) hours as needed. 11/02/23   Ward, Josette SAILOR, DO  ondansetron  (ZOFRAN -ODT) 4 MG disintegrating tablet Take 1 tablet (4 mg total) by mouth every 6 (six) hours as needed for nausea or vomiting. 11/02/23   Ward, Josette SAILOR, DO      VITAL SIGNS:  Blood pressure 130/83, pulse 86, temperature 98.6 F (37 C), temperature source Oral, resp. rate 19, height 5' 4 (1.626 m), weight 68.5 kg, SpO2 97%.  PHYSICAL EXAMINATION:  Physical Exam  GENERAL:  53 y.o.-year-old Caucasian female patient lying in the bed with no acute distress.  EYES: Pupils equal, round, reactive to light and accommodation. No scleral icterus. Extraocular muscles intact.  HEENT: Head atraumatic, normocephalic. Oropharynx and nasopharynx clear.  NECK:  Supple, no jugular venous distention. No thyroid enlargement, no tenderness.  LUNGS: Normal breath sounds bilaterally, no wheezing, rales,rhonchi or crepitation. No use of accessory muscles of respiration.  CARDIOVASCULAR: Regular rate and rhythm, S1, S2 normal. No murmurs, rubs, or gallops.  ABDOMEN: Soft, nondistended, nontender. Bowel  sounds present. No organomegaly or mass.  EXTREMITIES: No pedal edema, cyanosis, or clubbing.  NEUROLOGIC: Cranial nerves II through XII are intact. Muscle strength 5/5 in all extremities. Sensation intact. Gait not checked.  PSYCHIATRIC: The patient is alert and oriented x 3.  Normal affect and good eye contact. SKIN: No obvious rash, lesion, or ulcer.   LABORATORY PANEL:   CBC Recent Labs  Lab 06/09/24 1716  WBC 7.8  HGB 13.3  HCT 40.9  PLT 285   ------------------------------------------------------------------------------------------------------------------  Chemistries  Recent Labs  Lab 06/09/24 1716  NA 139  K 3.4*  CL 102  CO2 23  GLUCOSE 288*  BUN 8  CREATININE 0.73  CALCIUM  9.1   ------------------------------------------------------------------------------------------------------------------  Cardiac Enzymes No results for input(s): TROPONINI in the last 168 hours. ------------------------------------------------------------------------------------------------------------------  RADIOLOGY:  DG Chest 2 View Result Date: 06/09/2024 EXAM: 2 VIEW(S) XRAY OF THE CHEST 06/09/2024 05:39:00 PM COMPARISON: None available. CLINICAL HISTORY: chest pain FINDINGS: LUNGS AND PLEURA: No focal pulmonary opacity. No pleural effusion. No pneumothorax. HEART AND MEDIASTINUM: No acute abnormality of the cardiac and mediastinal silhouettes. BONES  AND SOFT TISSUES: No acute osseous abnormality. IMPRESSION: 1. No acute process. Electronically signed by: Dorethia Molt MD 06/09/2024 05:45 PM EST RP Workstation: [id number]      IMPRESSION AND PLAN:  Assessment and Plan: * Chest pain - The patient will be admitted to an observation cardiac telemetry bed. - This is associated with increasing troponin I and tachycardia. - Will follow serial troponins and EKGs. - The patient will be placed on aspirin  as well as p.r.n. sublingual nitroglycerin and morphine  sulfate for pain. - We  will obtain a cardiology consult in a.m. for further cardiac risk stratification. - I notified Dr. Florencio about the patient. - D-dimer came back negative.   COVID-19 - The patient had an outpatient test that was positive. - Will check respiratory panel. - Mucolytic therapy will be provided. - The patient will be placed on hydration with IV normal saline.  Hypokalemia - We will replace potassium and check magnesium  level.  Urinary frequency - Will check UA.  Chronic obstructive pulmonary disease (COPD) (HCC) - No current exacerbation. - Will place on as needed albuterol  MDI.  Controlled type 2 diabetes mellitus without complication, without long-term current use of insulin  (HCC) - The will be placed on supplemental coverage with NovoLog .    DVT prophylaxis: Lovenox .  Advanced Care Planning:  Code Status: full code.  Family Communication:  The plan of care was discussed in details with the patient (and family). I answered all questions. The patient agreed to proceed with the above mentioned plan. Further management will depend upon hospital course. Disposition Plan: Back to previous home environment Consults called: Cardiology All the records are reviewed and case discussed with ED provider.  Status is: Observation  I certify that at the time of admission, it is my clinical judgment that the patient will require hospital care extending less than 2 midnights.                            Dispo: The patient is from: Home              Anticipated d/c is to: Home              Patient currently is not medically stable to d/c.              Difficult to place patient: No  Madison DELENA Peaches M.D on 06/10/2024 at 12:14 AM  [facility]   From 7 PM-7 AM, contact night-coverage [email or web address]  CC: Primary care physician; Pcp, No     [1]  Allergies Allergen Reactions   Chlorhexidine  Itching   Ivp Dye [Iodinated Contrast Media]    Clindamycin/Lincomycin Rash   Demerol  [Meperidine] Rash   Elemental Sulfur Rash   Morphine  And Codeine Hives and Rash

## 2024-06-09 NOTE — ED Provider Notes (Signed)
 [facility] Provider Note    Event Date/Time   First MD Initiated Contact with Patient 06/09/24 1910     (approximate)   History   Chest Pain   HPI  [name] is a 53 y.o. female who presents to the emergency department today because of concerns for chest pain.  She describes it as sharp.  Located across her chest.  Has been worse with deep breaths.  Patient was diagnosed with COVID yesterday.  Does have headaches.     Physical Exam   Triage Vital Signs: ED Triage Vitals  Encounter Vitals Group     BP 06/09/24 1710 120/80     Girls Systolic BP Percentile --      Girls Diastolic BP Percentile --      Boys Systolic BP Percentile --      Boys Diastolic BP Percentile --      Pulse Rate 06/09/24 1710 (!) 105     Resp 06/09/24 1710 20     Temp 06/09/24 1710 98.4 F (36.9 C)     Temp Source 06/09/24 1710 Oral     SpO2 06/09/24 1710 100 %     Weight 06/09/24 1715 151 lb (68.5 kg)     Height 06/09/24 1715 5' 4 (1.626 m)     Head Circumference --      Peak Flow --      Pain Score 06/09/24 1711 8     Pain Loc --      Pain Education --      Exclude from Growth Chart --     Most recent vital signs: Vitals:   06/09/24 1710  BP: 120/80  Pulse: (!) 105  Resp: 20  Temp: 98.4 F (36.9 C)  SpO2: 100%   General: Awake, alert, oriented. CV:  Good peripheral perfusion. Tachycardia. Resp:  Normal effort. Lungs clear. Abd:  No distention.    ED Results / Procedures / Treatments   Labs (all labs ordered are listed, but only abnormal results are displayed) Labs Reviewed  BASIC METABOLIC PANEL WITH GFR - Abnormal; Notable for the following components:      Result Value   Potassium 3.4 (*)    Glucose, Bld 288 (*)    All other components within normal limits  CBC  TROPONIN T, HIGH SENSITIVITY  TROPONIN T, HIGH SENSITIVITY     EKG  I, Guadalupe Eagles, attending physician, personally viewed and interpreted this EKG  EKG Time:  1711 Rate: 108 Rhythm: sinus tachycardia Axis: normal Intervals: qtc 485 QRS: normal ST changes: no st elevation Impression: abnormal ekg   RADIOLOGY I independently interpreted and visualized the CXR. My interpretation: No pneumonia Radiology interpretation:  IMPRESSION:  1. No acute process.      PROCEDURES:  Critical Care performed: No    MEDICATIONS ORDERED IN ED: Medications - No data to display   IMPRESSION / MDM / ASSESSMENT AND PLAN / ED COURSE  I reviewed the triage vital signs and the nursing notes.                              Differential diagnosis includes, but is not limited to, PE, pneumonia, AVS  Patient's presentation is most consistent with acute presentation with potential threat to life or bodily function.   Patient presented to the emergency department today because of concerns for chest pain in setting of recent COVID diagnosis.  Given that it is  worse with deep breaths will check D-dimer as well as troponin.  Patient has allergies to IV contrast so we will start prep in the event that the D-dimer is elevated.  D-dimer was negative.  However repeat troponin was now elevated.  I discussed this with the patient.  Will give dose of aspirin  here.  Will admit to the hospital service.     FINAL CLINICAL IMPRESSION(S) / ED DIAGNOSES   Final diagnoses:  Elevated troponin       Note:  This document was prepared using Dragon voice recognition software and may include unintentional dictation errors.    Floy Roberts, MD 06/09/24 2322

## 2024-06-09 NOTE — ED Triage Notes (Signed)
 Pt to ED via [facility] for c/o chest pain. Pt states pain is intermittent, mid chest and describes it as sharp. Pt also states SOB.

## 2024-06-09 NOTE — ED Notes (Signed)
Meds given  labs sent.

## 2024-06-09 NOTE — ED Triage Notes (Signed)
 First nurse note: pt to ED [facility] for chest pain, shob. +covid yesterday. 99% RA cbg 324

## 2024-06-09 NOTE — Assessment & Plan Note (Signed)
-   The will be placed on supplemental coverage with NovoLog.

## 2024-06-09 NOTE — Assessment & Plan Note (Addendum)
 Stress test negative, echocardiogram with a normal ejection fraction.  Chest pain reproducible in nature likely costochondritis with viral infection.  Given Solu-Medrol  here will give quick prednisone  taper upon going home.  Cardiology prescribed aspirin  and Crestor .

## 2024-06-10 ENCOUNTER — Observation Stay: Admit: 2024-06-10 | Discharge: 2024-06-10 | Disposition: A | Payer: Self-pay | Attending: Student

## 2024-06-10 ENCOUNTER — Observation Stay: Payer: Self-pay

## 2024-06-10 DIAGNOSIS — J439 Emphysema, unspecified: Secondary | ICD-10-CM

## 2024-06-10 DIAGNOSIS — I5A Non-ischemic myocardial injury (non-traumatic): Secondary | ICD-10-CM

## 2024-06-10 DIAGNOSIS — E876 Hypokalemia: Secondary | ICD-10-CM

## 2024-06-10 DIAGNOSIS — R350 Frequency of micturition: Secondary | ICD-10-CM

## 2024-06-10 DIAGNOSIS — E785 Hyperlipidemia, unspecified: Secondary | ICD-10-CM

## 2024-06-10 DIAGNOSIS — U071 COVID-19: Secondary | ICD-10-CM

## 2024-06-10 LAB — URINALYSIS, COMPLETE (UACMP) WITH MICROSCOPIC
Bacteria, UA: NONE SEEN
Bilirubin Urine: NEGATIVE
Glucose, UA: >=500 mg/dL — AB
Hgb urine dipstick: NEGATIVE
Ketones, ur: NEGATIVE mg/dL
Leukocytes,Ua: NEGATIVE
Nitrite: NEGATIVE
Protein, ur: NEGATIVE mg/dL
Specific Gravity, Urine: 1.022 (ref 1.005–1.030)
pH: 6 (ref 5.0–8.0)

## 2024-06-10 LAB — LIPID PANEL
Cholesterol: 212 mg/dL — ABNORMAL HIGH (ref 0–200)
HDL: 54 mg/dL (ref >40)
LDL Cholesterol: 136 mg/dL — ABNORMAL HIGH (ref 0–99)
Total CHOL/HDL Ratio: 3.9 ratio
Triglycerides: 109 mg/dL (ref <150)
VLDL: 22 mg/dL (ref 0–40)

## 2024-06-10 LAB — ECHOCARDIOGRAM COMPLETE
Area-P 1/2: 3.77 cm2
Height: 64 in
S' Lateral: 2.6 cm
Weight: 2416 [oz_av]

## 2024-06-10 LAB — NM MYOCAR MULTI W/SPECT W/WALL MOTION / EF
Base ST Depression (mm): 0 mm
Estimated workload: 1
Exercise duration (min): 1 min
Exercise duration (sec): 0 s
LV dias vol: 58 mL (ref 46–106)
LV sys vol: 10 mL (ref 3.8–5.2)
MPHR: 165 {beats}/min
Nuc Stress EF: 83 %
Peak HR: 108 {beats}/min
Percent HR: 65 %
Rest HR: 80 {beats}/min
Rest Nuclear Isotope Dose: 10.1 mCi
SDS: 0
SRS: 0
SSS: 0
ST Depression (mm): 0 mm
Stress Nuclear Isotope Dose: 32.4 mCi
TID: 0.87

## 2024-06-10 LAB — HEMOGLOBIN A1C
Hgb A1c MFr Bld: 6.9 % — ABNORMAL HIGH (ref 4.8–5.6)
Mean Plasma Glucose: 151.33 mg/dL

## 2024-06-10 LAB — HIV ANTIBODY (ROUTINE TESTING W REFLEX): HIV Screen 4th Generation wRfx: NONREACTIVE

## 2024-06-10 LAB — MAGNESIUM: Magnesium: 2.2 mg/dL (ref 1.7–2.4)

## 2024-06-10 LAB — RESP PANEL BY RT-PCR (RSV, FLU A&B, COVID)  RVPGX2
Influenza A by PCR: NEGATIVE
Influenza B by PCR: NEGATIVE
Resp Syncytial Virus by PCR: NEGATIVE
SARS Coronavirus 2 by RT PCR: NEGATIVE

## 2024-06-10 LAB — CBG MONITORING, ED: Glucose-Capillary: 249 mg/dL — ABNORMAL HIGH (ref 70–99)

## 2024-06-10 MED ORDER — INSULIN ASPART 100 UNIT/ML IJ SOLN
0.0000 [IU] | Freq: Three times a day (TID) | INTRAMUSCULAR | Status: DC
  Administered 2024-06-10: 16:00:00 3 [IU] via SUBCUTANEOUS
  Filled 2024-06-10: qty 3

## 2024-06-10 MED ORDER — REGADENOSON 0.4 MG/5ML IV SOLN
0.4000 mg | Freq: Once | INTRAVENOUS | Status: AC
  Administered 2024-06-10: 13:00:00 0.4 mg via INTRAVENOUS

## 2024-06-10 MED ORDER — ROSUVASTATIN CALCIUM 20 MG PO TABS: 20.0000 mg | ORAL_TABLET | Freq: Every day | ORAL | Status: DC

## 2024-06-10 MED ORDER — IPRATROPIUM-ALBUTEROL 0.5-2.5 (3) MG/3ML IN SOLN: 3.0000 mL | Freq: Four times a day (QID) | RESPIRATORY_TRACT | Status: DC | PRN

## 2024-06-10 MED ORDER — PREDNISONE 10 MG PO TABS: ORAL_TABLET | ORAL | 0 refills | Status: AC

## 2024-06-10 MED ORDER — METHYLPREDNISOLONE SODIUM SUCC 40 MG IJ SOLR
40.0000 mg | Freq: Every day | INTRAMUSCULAR | Status: DC
  Administered 2024-06-10: 11:00:00 40 mg via INTRAVENOUS
  Filled 2024-06-10: qty 1

## 2024-06-10 MED ORDER — ROSUVASTATIN CALCIUM 20 MG PO TABS: 20.0000 mg | ORAL_TABLET | Freq: Every day | ORAL | 0 refills | Status: AC

## 2024-06-10 MED ORDER — TECHNETIUM TC 99M TETROFOSMIN IV KIT
32.4000 | PACK | Freq: Once | INTRAVENOUS | Status: AC | PRN
  Administered 2024-06-10: 13:00:00 32.4 via INTRAVENOUS

## 2024-06-10 MED ORDER — IPRATROPIUM-ALBUTEROL 0.5-2.5 (3) MG/3ML IN SOLN
3.0000 mL | Freq: Four times a day (QID) | RESPIRATORY_TRACT | Status: DC
  Administered 2024-06-10: 17:00:00 3 mL via RESPIRATORY_TRACT
  Filled 2024-06-10: qty 3

## 2024-06-10 MED ORDER — ASPIRIN 81 MG PO TBEC: 81.0000 mg | DELAYED_RELEASE_TABLET | Freq: Every day | ORAL | 0 refills | Status: AC

## 2024-06-10 MED ORDER — TECHNETIUM TC 99M TETROFOSMIN IV KIT
10.0500 | PACK | Freq: Once | INTRAVENOUS | Status: AC | PRN
  Administered 2024-06-10: 12:00:00 10.05 via INTRAVENOUS

## 2024-06-10 MED ORDER — INSULIN ASPART 100 UNIT/ML IJ SOLN: 0.0000 [IU] | Freq: Every day | INTRAMUSCULAR | Status: DC

## 2024-06-10 MED ORDER — ACETAMINOPHEN 325 MG PO TABS: 650.0000 mg | ORAL_TABLET | ORAL | Status: AC | PRN

## 2024-06-10 MED ORDER — POTASSIUM CHLORIDE CRYS ER 20 MEQ PO TBCR: 40.0000 meq | EXTENDED_RELEASE_TABLET | Freq: Once | ORAL | Status: DC

## 2024-06-10 NOTE — Hospital Course (Addendum)
 53 y.o. Caucasian female with medical history significant for COPD, seizures and back pain, who presented to the emergency room with acute onset of chest pain worsening with deep breathing.  She describes it as stinging and stabbing constant pain graded 8/10 in severity with associated mild dyspnea and nausea.  She was diagnosed with COVID-19 yesterday.  She has been having generalized fatigue and weakness since Sunday.  She admitted to nausea and vomiting intermittently as well as sore throat.  She denied any diarrhea or bilious vomitus or hematemesis, melena or bright red bleeding per rectum or abdominal pain.  No cough or wheezing.  She admitted to dyspnea with her symptoms.  No fever or chills.  No dysuria, oliguria or hematuria or flank pain.  No headache or dizziness or blurred vision.  No bleeding diathesis.   ED Course: When the patient came to the ER, heart rate was 105 with otherwise normal vital signs.  Labs revealed hypokalemia of 3.4 with blood glucose of 288 and otherwise unremarkable BMP.  High-sensitivity troponin T was less than 15 and later 27.  CBC was normal.  D-dimer was negative at 0.3. EKG as reviewed by me : EKG showed sinus tachycardia with rate of 108 with T wave inversion inferolaterally. Imaging: 2 view chest x-ray showed no acute cardiopulmonary disease.   The patient was given 4 baby aspirin  and 40 mg of IV Solu-Medrol .  She will be admitted to an observation telemetry bed for further evaluation and management.  12/18.  Pain seems a little more reproducible right chest wall.  Given another dose of steroid today to see if that makes a difference.  Patient still having continuous chest discomfort.  Stress test was negative a low risk scan.  Echocardiogram shows a normal EF 60 to 65%.

## 2024-06-10 NOTE — Assessment & Plan Note (Signed)
 Will check UA

## 2024-06-10 NOTE — Consult Note (Addendum)
 [facility] CARDIOLOGY CONSULT NOTE       Patient ID: [name] MRN: [id number] DOB/AGE: 1971-05-19 53 y.o.  Admit date: 06/09/2024 Referring Physician Dr. Madison Peaches Primary Physician Pcp, No  Primary Cardiologist None [facility] Hester 2021) Reason for Consultation Chest pain  HPI: [name] is a 53 y.o. female  with a past medical history of nonobstructive coronary artery disease by [facility] 2021, COPD, seizures, chronic back pain who presented to the ED on 06/09/2024 for chest pain. Cardiology was consulted for further evaluation.   Patient with episodes of chest pain starting yesterday, given that she decided to come in for evaluation.  Workup in the ED notable for creatinine 0.73, potassium 3.4, hemoglobin 13.3, WBC 7.8. Troponins <15 > 37. EKG in the ED sinus tach rate 108 bpm.  Chest x-ray without acute abnormality.  At the time of evaluation this morning, patient was resting comfortably in hospital bed.  We discussed her symptoms in further detail.  She endorses chest pain over the last few days but this was worse yesterday.  She reports that this does get worse with deep inspiration.  States that she had similar episodes starting 5 years ago and that they have been occasional since that time.  States that she does have some shortness of breath with exertion.  Endorses occasional episodes of dizziness and lightheadedness.  Denies any recent syncope.  States that she has noticed some swelling in her feet  Review of systems complete and found to be negative unless listed above    Past Medical History:  Diagnosis Date   Back pain    Cancer (HCC)    + CANCER CELLS TO UTERUS   COPD (chronic obstructive pulmonary disease) (HCC)    Family history of adverse reaction to anesthesia    FATHER STOPS BREATHING WITH ANESTHESIA   Headache    HX MIGRAINES   Seizures (HCC)    1 SEIZURE  VERY STRESSED    Past Surgical History:  Procedure Laterality Date   ABDOMINAL  HYSTERECTOMY     APPENDECTOMY     BACK SURGERY  2016   COLONOSCOPY N/A 04/03/2023   Procedure: COLONOSCOPY;  Surgeon: Unk Corinn Skiff, MD;  Location: [facility] ENDOSCOPY;  Service: Gastroenterology;  Laterality: N/A;   COLONOSCOPY WITH PROPOFOL  N/A 06/01/2019   Procedure: COLONOSCOPY WITH PROPOFOL ;  Surgeon: Therisa Bi, MD;  Location: [facility] ENDOSCOPY;  Service: Gastroenterology;  Laterality: N/A;   COLONOSCOPY WITH PROPOFOL  N/A 04/02/2023   Procedure: COLONOSCOPY WITH PROPOFOL ;  Surgeon: Unk Corinn Skiff, MD;  Location: [facility] ENDOSCOPY;  Service: Gastroenterology;  Laterality: N/A;   LEFT HEART CATH AND CORONARY ANGIOGRAPHY Left 03/24/2020   Procedure: LEFT HEART CATH AND CORONARY ANGIOGRAPHY;  Surgeon: Hester Wolm PARAS, MD;  Location: [facility] INVASIVE CV LAB;  Service: Cardiovascular;  Laterality: Left;   POLYPECTOMY  04/02/2023   Procedure: POLYPECTOMY;  Surgeon: Unk Corinn Skiff, MD;  Location: [facility] ENDOSCOPY;  Service: Gastroenterology;;   POLYPECTOMY  04/03/2023   Procedure: POLYPECTOMY;  Surgeon: Unk Corinn Skiff, MD;  Location: [facility] ENDOSCOPY;  Service: Gastroenterology;;    (Not in a hospital admission)  Social History   Socioeconomic History   Marital status: Legally Separated    Spouse name: Not on file   Number of children: 2   Years of education: Not on file   Highest education level: Not on file  Occupational History   Not on file  Tobacco Use   Smoking status: Every Day    Current packs/day: 0.50  Average packs/day: 0.5 packs/day for 28.0 years (14.0 ttl pk-yrs)    Types: Cigarettes    Passive exposure: Past   Smokeless tobacco: Never   Tobacco comments:    Half a pack  Vaping Use   Vaping status: Every Day  Substance and Sexual Activity   Alcohol use: No    Alcohol/week: 0.0 standard drinks of alcohol   Drug use: No   Sexual activity: Not on file  Other Topics Concern   Not on file  Social History Narrative   Lives with boyfriend    Social Drivers  of Health   Tobacco Use: High Risk (06/09/2024)   Patient History    Smoking Tobacco Use: Every Day    Smokeless Tobacco Use: Never    Passive Exposure: Past  Financial Resource Strain: Not on file  Food Insecurity: Not on file  Transportation Needs: Not on file  Physical Activity: Not on file  Stress: Not on file  Social Connections: Not on file  Intimate Partner Violence: Not on file  Depression (PHQ2-9): Low Risk (08/29/2021)   Depression (PHQ2-9)    PHQ-2 Score: 0  Alcohol Screen: Low Risk (08/29/2021)   Alcohol Screen    Last Alcohol Screening Score (AUDIT): 0  Housing: Not on file  Utilities: Not on file  Health Literacy: Not on file    Family History  Problem Relation Age of Onset   Heart attack Father    Lupus Sister    Breast cancer Maternal Grandmother 40   Lupus Maternal Grandfather      Vitals:   06/09/24 2347 06/10/24 0013 06/10/24 0400 06/10/24 0506  BP:  130/83 98/67 112/77  Pulse:  86 84 86  Resp:  19 15 18   Temp:  98.6 F (37 C)  98 F (36.7 C)  TempSrc:  Oral  Oral  SpO2: 98% 97%  96%  Weight:      Height:        PHYSICAL EXAM General: Well-appearing female, well nourished, in no acute distress. HEENT: Normocephalic and atraumatic. Neck: No JVD.  Lungs: Normal respiratory effort on room air. Clear bilaterally to auscultation. No wheezes, crackles, rhonchi.  Heart: HRRR. Normal S1 and S2 without gallops or murmurs.  Abdomen: Non-distended appearing.  Msk: Normal strength and tone for age. Extremities: Warm and well perfused. No clubbing, cyanosis.  Trace edema.  Neuro: Alert and oriented X 3. Psych: Answers questions appropriately.   Labs: Basic Metabolic Panel: Recent Labs    06/09/24 1716 06/09/24 2020  NA 139  --   K 3.4*  --   CL 102  --   CO2 23  --   GLUCOSE 288*  --   BUN 8  --   CREATININE 0.73  --   CALCIUM  9.1  --   MG  --  2.2   Liver Function Tests: No results for input(s): AST, ALT, ALKPHOS, BILITOT, PROT,  ALBUMIN  in the last 72 hours. No results for input(s): LIPASE, AMYLASE in the last 72 hours. CBC: Recent Labs    06/09/24 1716  WBC 7.8  HGB 13.3  HCT 40.9  MCV 96.9  PLT 285   Cardiac Enzymes: No results for input(s): CKTOTAL, CKMB, CKMBINDEX, TROPONINIHS in the last 72 hours. BNP: No results for input(s): BNP in the last 72 hours. D-Dimer: Recent Labs    06/09/24 2020  DDIMER 0.30   Hemoglobin A1C: Recent Labs    06/09/24 1717  HGBA1C 6.9*   Fasting Lipid Panel: Recent Labs  06/10/24 0511  CHOL 212*  HDL 54  LDLCALC 136*  TRIG 109  CHOLHDL 3.9   Thyroid Function Tests: No results for input(s): TSH, T4TOTAL, T3FREE, THYROIDAB in the last 72 hours.  Invalid input(s): FREET3 Anemia Panel: No results for input(s): VITAMINB12, FOLATE, FERRITIN, TIBC, IRON, RETICCTPCT in the last 72 hours.   Radiology: DG Chest 2 View Result Date: 06/09/2024 EXAM: 2 VIEW(S) XRAY OF THE CHEST 06/09/2024 05:39:00 PM COMPARISON: None available. CLINICAL HISTORY: chest pain FINDINGS: LUNGS AND PLEURA: No focal pulmonary opacity. No pleural effusion. No pneumothorax. HEART AND MEDIASTINUM: No acute abnormality of the cardiac and mediastinal silhouettes. BONES AND SOFT TISSUES: No acute osseous abnormality. IMPRESSION: 1. No acute process. Electronically signed by: Dorethia Molt MD 06/09/2024 05:45 PM EST RP Workstation: [id number]    ECHO pending  TELEMETRY (personally reviewed): Sinus rhythm rate 80s  EKG (personally reviewed): Sinus tachycardia rate 108 bpm  Data reviewed by me 06/10/2024: last 24h vitals tele labs imaging I/O ED provider note, admission H&P  Principal Problem:   Chest pain Active Problems:   Controlled type 2 diabetes mellitus without complication, without long-term current use of insulin  (HCC)   Chronic obstructive pulmonary disease (COPD) (HCC)   COVID-19   Urinary frequency   Hypokalemia    ASSESSMENT AND PLAN:   [name] is a 53 y.o. female  with a past medical history of nonobstructive coronary artery disease by [facility] 2021, COPD, seizures, chronic back pain who presented to the ED on 06/09/2024 for chest pain. Cardiology was consulted for further evaluation.   # Atypical chest pain # Nonobstructive coronary artery disease # COPD Patient presented with chest pain which was described as sharp in nature and worse with deep inspiration.  Not exacerbated by activity.  Troponins trended <15, 37.  EKG without acute ischemic changes.  Chest x-ray without acute abnormality. - Nuclear stress test today for additional evaluation of possible ischemia.  Further recommendations pending these results. - S/p ASA 325 in the ED.  Continue aspirin  81 mg daily. - Start Crestor  20 mg daily given history of known coronary disease and poorly controlled cholesterol.  ADDENDUM: Echo with preserved EF and no WMAs, and stress test without ischemia or infarct. Ok to go home from cardiac perspective.   This patient's plan of care was discussed and created with Dr. Ammon and he is in agreement.  Signed: Danita Bloch, PA-C  06/10/2024, 2:40 PM [facility]

## 2024-06-10 NOTE — Assessment & Plan Note (Signed)
 Troponin slightly elevated.  With stress test negative and echocardiogram normal this is not cardiac in nature likely demand ischemia.

## 2024-06-10 NOTE — Assessment & Plan Note (Signed)
-   We will replace potassium and check magnesium level.

## 2024-06-10 NOTE — ED Notes (Signed)
 Patient notified that a urine sample has been ordered.

## 2024-06-10 NOTE — Assessment & Plan Note (Signed)
 Crestor  prescribed.  LDL 136.

## 2024-06-10 NOTE — Progress Notes (Signed)
° °  Brief Progress Note   _____________________________________________________________________________________________________________  Patient Name: [name] Patient DOB: 18-Aug-1970 Date: @TODAY @      Data: Reviewed vital signs, labs, and clinical notes.    Action: No action required at this time.    Response:  + COVID, + CP, + trop  _____________________________________________________________________________________________________________  [facility] RN Expeditor Lashay Osborne S Ivor Kishi Please contact us  directly via secure chat (search for [facility]) or by calling us  at [phone] [facility]).

## 2024-06-10 NOTE — Inpatient Diabetes Management (Signed)
 Inpatient Diabetes Program Recommendations  AACE/ADA: New Consensus Statement on Inpatient Glycemic Control   Target Ranges:  Prepandial:   less than 140 mg/dL      Peak postprandial:   less than 180 mg/dL (1-2 hours)      Critically ill patients:  140 - 180 mg/dL     Latest Reference Range & Units 06/09/24 17:16  Glucose 70 - 99 mg/dL 711 (H)    Latest Reference Range & Units 06/09/24 17:17  Hemoglobin A1C 4.8 - 5.6 % 6.9 (H)   Review of Glycemic Control  Diabetes history: DM2 Outpatient Diabetes medications: None Current orders for Inpatient glycemic control: None; Solumedrol 40 mg daily  Inpatient Diabetes Program Recommendations:    Insulin : Lab glucose 288 mg/dl on 87/82/74 at 82:83. NO CBGs or lab glucose values since then. Patient is ordered steroids. Please consider ordering CBGs AC&HS and Novolog  0-9 units TID with meals and Novolog  0-5 units at bedtime.  Thanks, Earnie Gainer, RN, MSN, CDCES Diabetes Coordinator Inpatient Diabetes Program [phone] (Team Pager from 8am to 5pm)

## 2024-06-10 NOTE — Assessment & Plan Note (Signed)
-   The patient had an outpatient test that was positive. - Will check respiratory panel. - Mucolytic therapy will be provided. - The patient will be placed on hydration with IV normal saline.

## 2024-06-10 NOTE — Discharge Summary (Signed)
 Physician Discharge Summary   Patient: [name] MRN: [id number] DOB: [birth year]  Admit date:     06/09/2024  Discharge date: 06/10/2024  Discharge Physician: Charlie Patterson   PCP: Pcp, No   Recommendations at discharge:   Follow-up with your medical doctor 5 days Follow-up cardiology 1 week  Discharge Diagnoses: Principal Problem:   Chest pain Active Problems:   Controlled type 2 diabetes mellitus without complication, without long-term current use of insulin  (HCC)   COVID-19   Urinary frequency   Hypokalemia   Hyperlipidemia, unspecified   Chronic obstructive pulmonary disease (COPD) (HCC)   Myocardial injury    Hospital Course: 53 y.o. Caucasian female with medical history significant for COPD, seizures and back pain, who presented to the emergency room with acute onset of chest pain worsening with deep breathing.  She describes it as stinging and stabbing constant pain graded 8/10 in severity with associated mild dyspnea and nausea.  She was diagnosed with COVID-19 yesterday.  She has been having generalized fatigue and weakness since Sunday.  She admitted to nausea and vomiting intermittently as well as sore throat.  She denied any diarrhea or bilious vomitus or hematemesis, melena or bright red bleeding per rectum or abdominal pain.  No cough or wheezing.  She admitted to dyspnea with her symptoms.  No fever or chills.  No dysuria, oliguria or hematuria or flank pain.  No headache or dizziness or blurred vision.  No bleeding diathesis.   ED Course: When the patient came to the ER, heart rate was 105 with otherwise normal vital signs.  Labs revealed hypokalemia of 3.4 with blood glucose of 288 and otherwise unremarkable BMP.  High-sensitivity troponin T was less than 15 and later 27.  CBC was normal.  D-dimer was negative at 0.3. EKG as reviewed by me : EKG showed sinus tachycardia with rate of 108 with T wave inversion inferolaterally. Imaging: 2 view chest x-ray  showed no acute cardiopulmonary disease.   The patient was given 4 baby aspirin  and 40 mg of IV Solu-Medrol .  She will be admitted to an observation telemetry bed for further evaluation and management.  12/18.  Pain seems a little more reproducible right chest wall.  Given another dose of steroid today to see if that makes a difference.  Patient still having continuous chest discomfort.  Stress test was negative a low risk scan.  Echocardiogram shows a normal EF 60 to 65%.  Assessment and Plan: * Chest pain Stress test negative, echocardiogram with a normal ejection fraction.  Chest pain reproducible in nature likely costochondritis with viral infection.  Given Solu-Medrol  here will give quick prednisone  taper upon going home.  Cardiology prescribed aspirin  and Crestor .  Controlled type 2 diabetes mellitus without complication, without long-term current use of insulin  (HCC) Diet exercise and weight loss discussed.  Patient will start with diet and follow-up with her medical doctor about medications in the future.  Hemoglobin A1c 6.9.  Sugar elevation secondary to steroids.   COVID-19 - The patient had an outpatient test that was positive but our PCR here was negative.  Not quite sure what to make of this but patient does have hoarse voice likely viral infection.  Chest x-ray negative.  Hypokalemia Ordered replacement  Urinary frequency Likely secondary to glucose in the urine.  Low carbohydrate diet discussed.  Myocardial injury Troponin slightly elevated.  With stress test negative and echocardiogram normal this is not cardiac in nature likely demand ischemia.  Chronic obstructive pulmonary disease (COPD) (  HCC) - No current exacerbation.   Hyperlipidemia, unspecified Crestor  prescribed.  LDL 136.         Consultants: Cardiology Procedures performed: Stress test Disposition: Home Diet recommendation:  Carb modified diet DISCHARGE MEDICATION: Allergies as of 06/10/2024        Reactions   Chlorhexidine  Itching   Ivp Dye [iodinated Contrast Media]    Clindamycin/lincomycin Rash   Demerol [meperidine] Rash   Elemental Sulfur Rash   Morphine  And Codeine Hives, Rash        Medication List     STOP taking these medications    cephALEXin  500 MG capsule Commonly known as: KEFLEX    HYDROcodone -acetaminophen  5-325 MG tablet Commonly known as: NORCO/VICODIN   ibuprofen  800 MG tablet Commonly known as: ADVIL    ondansetron  4 MG disintegrating tablet Commonly known as: ZOFRAN -ODT       TAKE these medications    acetaminophen  325 MG tablet Commonly known as: TYLENOL  Take 2 tablets (650 mg total) by mouth every 4 (four) hours as needed for headache or mild pain (pain score 1-3).   aspirin  EC 81 MG tablet Take 1 tablet (81 mg total) by mouth daily. Swallow whole. Start taking on: June 11, 2024   predniSONE  10 MG tablet Commonly known as: DELTASONE  Two tabs po day 1; 1 tab po day 2,3, 1/2 tab po day 4,5   rosuvastatin  20 MG tablet Commonly known as: CRESTOR  Take 1 tablet (20 mg total) by mouth daily.        Follow-up Information     Paraschos, Alexander, MD. Go in 1 week(s).   Specialty: Cardiology Contact information: [address] [facility] [place] [address] [phone]         your medical doctor Follow up in 5 day(s).                 Discharge Exam: Filed Weights   06/09/24 1715  Weight: 68.5 kg   Physical Exam HENT:     Head: Normocephalic.     Mouth/Throat:     Pharynx: No oropharyngeal exudate.  Eyes:     General: Lids are normal.     Conjunctiva/sclera: Conjunctivae normal.  Cardiovascular:     Rate and Rhythm: Normal rate and regular rhythm.     Heart sounds: Normal heart sounds, S1 normal and S2 normal.  Pulmonary:     Breath sounds: Examination of the right-lower field reveals decreased breath sounds. Examination of the left-lower field reveals decreased breath  sounds. Decreased breath sounds present. No wheezing, rhonchi or rales.  Abdominal:     Palpations: Abdomen is soft.     Tenderness: There is no abdominal tenderness.  Musculoskeletal:     Right lower leg: No swelling.     Left lower leg: No swelling.  Skin:    General: Skin is warm.     Findings: No rash.  Neurological:     Mental Status: She is alert and oriented to person, place, and time.      Condition at discharge: stable  The results of significant diagnostics from this hospitalization (including imaging, microbiology, ancillary and laboratory) are listed below for reference.   Imaging Studies: NM Myocar Multi W/Spect W/Wall Motion / EF Result Date: 06/10/2024   The study is normal. The study is low risk.   No ST deviation was noted.   LV perfusion is normal. There is no evidence of ischemia. There is no evidence of infarction.   Left ventricular function is normal. Nuclear  stress EF: 83%. The left ventricular ejection fraction is hyperdynamic (>65%). End diastolic cavity size is normal. End systolic cavity size is normal. Normal LV function No scar or ischemia Low risk study   ECHOCARDIOGRAM COMPLETE Result Date: 06/10/2024    ECHOCARDIOGRAM REPORT   Patient Name:   [name] [facility] Date of Exam: 06/10/2024 Medical Rec #:  [id number]           Height:       64.0 in Accession #:    [id number]          Weight:       151.0 lb Date of Birth:  Oct 23, 1970            BSA:          1.736 m Patient Age:    53 years            BP:           117/74 mmHg Patient Gender: F                   HR:           80 bpm. Exam Location:  [facility] Procedure: 2D Echo, Cardiac Doppler and Color Doppler (Both Spectral and Color            Flow Doppler were utilized during procedure). Indications:     Dyspnea  History:         Patient has no prior history of Echocardiogram examinations.                  COPD; Risk Factors:Dyslipidemia and Diabetes.  Sonographer:     Philomena Daring Referring Phys:  [id number] CARALYN  HUDSON Diagnosing Phys: Marsa Dooms MD IMPRESSIONS  1. Left ventricular ejection fraction, by estimation, is 60 to 65%. The left ventricle has normal function. The left ventricle has no regional wall motion abnormalities. Left ventricular diastolic parameters are consistent with Grade I diastolic dysfunction (impaired relaxation).  2. Right ventricular systolic function is normal. The right ventricular size is normal.  3. The mitral valve is normal in structure. Trivial mitral valve regurgitation. No evidence of mitral stenosis.  4. The aortic valve is normal in structure. Aortic valve regurgitation is not visualized. No aortic stenosis is present.  5. The inferior vena cava is normal in size with greater than 50% respiratory variability, suggesting right atrial pressure of 3 mmHg. FINDINGS  Left Ventricle: Left ventricular ejection fraction, by estimation, is 60 to 65%. The left ventricle has normal function. The left ventricle has no regional wall motion abnormalities. Strain was performed and the global longitudinal strain is indeterminate. The left ventricular internal cavity size was normal in size. There is no left ventricular hypertrophy. Left ventricular diastolic parameters are consistent with Grade I diastolic dysfunction (impaired relaxation). Right Ventricle: The right ventricular size is normal. No increase in right ventricular wall thickness. Right ventricular systolic function is normal. Left Atrium: Left atrial size was normal in size. Right Atrium: Right atrial size was normal in size. Pericardium: There is no evidence of pericardial effusion. Mitral Valve: The mitral valve is normal in structure. Trivial mitral valve regurgitation. No evidence of mitral valve stenosis. Tricuspid Valve: The tricuspid valve is normal in structure. Tricuspid valve regurgitation is trivial. No evidence of tricuspid stenosis. Aortic Valve: The aortic valve is normal in structure. Aortic valve regurgitation is  not visualized. No aortic stenosis is present. Pulmonic Valve: The pulmonic valve was normal in structure. Pulmonic valve regurgitation  is not visualized. No evidence of pulmonic stenosis. Aorta: The aortic root is normal in size and structure. Venous: The inferior vena cava is normal in size with greater than 50% respiratory variability, suggesting right atrial pressure of 3 mmHg. IAS/Shunts: No atrial level shunt detected by color flow Doppler. Additional Comments: 3D was performed not requiring image post processing on an independent workstation and was indeterminate.  LEFT VENTRICLE PLAX 2D LVIDd:         4.00 cm   Diastology LVIDs:         2.60 cm   LV e' medial:    9.57 cm/s LV PW:         0.80 cm   LV E/e' medial:  10.7 LV IVS:        0.90 cm   LV e' lateral:   10.30 cm/s LVOT diam:     1.90 cm   LV E/e' lateral: 9.9 LV SV:         52 LV SV Index:   30 LVOT Area:     2.84 cm  RIGHT VENTRICLE             IVC RV S prime:     10.30 cm/s  IVC diam: 1.90 cm TAPSE (M-mode): 2.1 cm LEFT ATRIUM             Index        RIGHT ATRIUM           Index LA diam:        2.90 cm 1.67 cm/m   RA Area:     12.20 cm LA Vol (A2C):   31.0 ml 17.86 ml/m  RA Volume:   27.60 ml  15.90 ml/m LA Vol (A4C):   32.1 ml 18.49 ml/m LA Biplane Vol: 32.1 ml 18.49 ml/m  AORTIC VALVE LVOT Vmax:   94.60 cm/s LVOT Vmean:  63.500 cm/s LVOT VTI:    0.182 m  AORTA Ao Root diam: 3.00 cm MITRAL VALVE                TRICUSPID VALVE MV Area (PHT): 3.77 cm     TR Peak grad:   9.0 mmHg MV Decel Time: 201 msec     TR Vmax:        150.00 cm/s MV E velocity: 102.00 cm/s MV A velocity: 121.00 cm/s  SHUNTS MV E/A ratio:  0.84         Systemic VTI:  0.18 m                             Systemic Diam: 1.90 cm Marsa Dooms MD Electronically signed by Marsa Dooms MD Signature Date/Time: 06/10/2024/3:39:19 PM    Final    DG Chest 2 View Result Date: 06/09/2024 EXAM: 2 VIEW(S) XRAY OF THE CHEST 06/09/2024 05:39:00 PM COMPARISON: None  available. CLINICAL HISTORY: chest pain FINDINGS: LUNGS AND PLEURA: No focal pulmonary opacity. No pleural effusion. No pneumothorax. HEART AND MEDIASTINUM: No acute abnormality of the cardiac and mediastinal silhouettes. BONES AND SOFT TISSUES: No acute osseous abnormality. IMPRESSION: 1. No acute process. Electronically signed by: Dorethia Molt MD 06/09/2024 05:45 PM EST RP Workstation: [id number]    Microbiology: Results for orders placed or performed during the hospital encounter of 06/09/24  Resp panel by RT-PCR (RSV, Flu A&B, Covid) Anterior Nasal Swab     Status: None   Collection Time: 06/09/24 11:45 PM   Specimen: Anterior Nasal Swab  Result Value Ref Range Status   SARS Coronavirus 2 by RT PCR NEGATIVE NEGATIVE Final    Comment: (NOTE) SARS-CoV-2 target nucleic acids are NOT DETECTED.  The SARS-CoV-2 RNA is generally detectable in upper respiratory specimens during the acute phase of infection. The lowest concentration of SARS-CoV-2 viral copies this assay can detect is 138 copies/mL. A negative result does not preclude SARS-Cov-2 infection and should not be used as the sole basis for treatment or other patient management decisions. A negative result may occur with  improper specimen collection/handling, submission of specimen other than nasopharyngeal swab, presence of viral mutation(s) within the areas targeted by this assay, and inadequate number of viral copies(<138 copies/mL). A negative result must be combined with clinical observations, patient history, and epidemiological information. The expected result is Negative.  Fact Sheet for Patients:  bloggercourse.com  Fact Sheet for Healthcare Providers:  seriousbroker.it  This test is no t yet approved or cleared by the [place]  FDA and  has been authorized for detection and/or diagnosis of SARS-CoV-2 by FDA under an Emergency Use Authorization (EUA). This EUA will  remain  in effect (meaning this test can be used) for the duration of the COVID-19 declaration under Section 564(b)(1) of the Act, 21 U.S.C.section 360bbb-3(b)(1), unless the authorization is terminated  or revoked sooner.       Influenza A by PCR NEGATIVE NEGATIVE Final   Influenza B by PCR NEGATIVE NEGATIVE Final    Comment: (NOTE) The Xpert Xpress SARS-CoV-2/FLU/RSV plus assay is intended as an aid in the diagnosis of influenza from Nasopharyngeal swab specimens and should not be used as a sole basis for treatment. Nasal washings and aspirates are unacceptable for Xpert Xpress SARS-CoV-2/FLU/RSV testing.  Fact Sheet for Patients: bloggercourse.com  Fact Sheet for Healthcare Providers: seriousbroker.it  This test is not yet approved or cleared by the [place]  FDA and has been authorized for detection and/or diagnosis of SARS-CoV-2 by FDA under an Emergency Use Authorization (EUA). This EUA will remain in effect (meaning this test can be used) for the duration of the COVID-19 declaration under Section 564(b)(1) of the Act, 21 U.S.C. section 360bbb-3(b)(1), unless the authorization is terminated or revoked.     Resp Syncytial Virus by PCR NEGATIVE NEGATIVE Final    Comment: (NOTE) Fact Sheet for Patients: bloggercourse.com  Fact Sheet for Healthcare Providers: seriousbroker.it  This test is not yet approved or cleared by the [place]  FDA and has been authorized for detection and/or diagnosis of SARS-CoV-2 by FDA under an Emergency Use Authorization (EUA). This EUA will remain in effect (meaning this test can be used) for the duration of the COVID-19 declaration under Section 564(b)(1) of the Act, 21 U.S.C. section 360bbb-3(b)(1), unless the authorization is terminated or revoked.  Performed at [facility], [address] [place], [place] [address]      Labs: CBC: Recent Labs  Lab 06/09/24 1716  WBC 7.8  HGB 13.3  HCT 40.9  MCV 96.9  PLT 285   Basic Metabolic Panel: Recent Labs  Lab 06/09/24 1716 06/09/24 2020  NA 139  --   K 3.4*  --   CL 102  --   CO2 23  --   GLUCOSE 288*  --   BUN 8  --   CREATININE 0.73  --   CALCIUM  9.1  --   MG  --  2.2   Liver Function Tests: No results for input(s): AST, ALT, ALKPHOS, BILITOT, PROT, ALBUMIN  in the last 168 hours.  CBG: Recent Labs  Lab 06/10/24 1606  GLUCAP 249*    Discharge time spent: greater than 30 minutes.  Signed: Charlie Patterson, MD [facility] 06/10/2024
